# Patient Record
Sex: Male | Born: 1945 | Race: White | Hispanic: No | Marital: Married | Smoking: Never smoker
Health system: Southern US, Community
[De-identification: ages and names within clinical notes are randomized; demographics above are authoritative.]

## PROBLEM LIST (undated history)

## (undated) DIAGNOSIS — F4310 Post-traumatic stress disorder, unspecified: Secondary | ICD-10-CM

## (undated) DIAGNOSIS — I10 Essential (primary) hypertension: Secondary | ICD-10-CM

## (undated) DIAGNOSIS — K219 Gastro-esophageal reflux disease without esophagitis: Secondary | ICD-10-CM

## (undated) DIAGNOSIS — M109 Gout, unspecified: Secondary | ICD-10-CM

## (undated) DIAGNOSIS — F419 Anxiety disorder, unspecified: Secondary | ICD-10-CM

## (undated) DIAGNOSIS — E7800 Pure hypercholesterolemia, unspecified: Secondary | ICD-10-CM

## (undated) DIAGNOSIS — I499 Cardiac arrhythmia, unspecified: Secondary | ICD-10-CM

## (undated) DIAGNOSIS — I4891 Unspecified atrial fibrillation: Secondary | ICD-10-CM

## (undated) DIAGNOSIS — F32A Depression, unspecified: Secondary | ICD-10-CM

## (undated) DIAGNOSIS — N19 Unspecified kidney failure: Secondary | ICD-10-CM

## (undated) DIAGNOSIS — M1990 Unspecified osteoarthritis, unspecified site: Secondary | ICD-10-CM

## (undated) HISTORY — PX: TOTAL SHOULDER REPLACEMENT: SUR1217

## (undated) HISTORY — DX: Essential (primary) hypertension: I10

## (undated) HISTORY — DX: Pure hypercholesterolemia, unspecified: E78.00

## (undated) HISTORY — DX: Anxiety disorder, unspecified: F41.9

## (undated) HISTORY — PX: REPLACEMENT TOTAL KNEE: SUR1224

## (undated) HISTORY — PX: SKIN CANCER EXCISION: SHX779

## (undated) HISTORY — DX: Gastro-esophageal reflux disease without esophagitis: K21.9

## (undated) HISTORY — DX: Gout, unspecified: M10.9

## (undated) HISTORY — DX: Unspecified kidney failure: N19

## (undated) HISTORY — DX: Unspecified atrial fibrillation: I48.91

## (undated) HISTORY — DX: Depression, unspecified: F32.A

## (undated) HISTORY — DX: Unspecified osteoarthritis, unspecified site: M19.90

## (undated) HISTORY — DX: Post-traumatic stress disorder, unspecified: F43.10

---

## 2010-03-13 DIAGNOSIS — R809 Proteinuria, unspecified: Secondary | ICD-10-CM | POA: Insufficient documentation

## 2011-04-06 HISTORY — PX: KIDNEY TRANSPLANT: SHX239

## 2011-11-05 LAB — HM HEPATITIS C SCREENING LAB: HM Hepatitis Screen: NEGATIVE

## 2012-02-08 DIAGNOSIS — M109 Gout, unspecified: Secondary | ICD-10-CM | POA: Insufficient documentation

## 2020-04-21 ENCOUNTER — Ambulatory Visit: Payer: Self-pay | Admitting: Internal Medicine

## 2020-04-25 ENCOUNTER — Other Ambulatory Visit: Payer: Self-pay

## 2020-04-25 ENCOUNTER — Ambulatory Visit (INDEPENDENT_AMBULATORY_CARE_PROVIDER_SITE_OTHER): Payer: Medicare Other | Admitting: Internal Medicine

## 2020-04-25 ENCOUNTER — Encounter: Payer: Self-pay | Admitting: Internal Medicine

## 2020-04-25 VITALS — BP 154/64 | HR 64 | Temp 97.9°F | Ht 66.0 in | Wt 165.0 lb

## 2020-04-25 DIAGNOSIS — Z7689 Persons encountering health services in other specified circumstances: Secondary | ICD-10-CM

## 2020-04-25 DIAGNOSIS — Z940 Kidney transplant status: Secondary | ICD-10-CM | POA: Insufficient documentation

## 2020-04-25 DIAGNOSIS — I480 Paroxysmal atrial fibrillation: Secondary | ICD-10-CM | POA: Diagnosis not present

## 2020-04-25 DIAGNOSIS — F4310 Post-traumatic stress disorder, unspecified: Secondary | ICD-10-CM | POA: Insufficient documentation

## 2020-04-25 DIAGNOSIS — N400 Enlarged prostate without lower urinary tract symptoms: Secondary | ICD-10-CM

## 2020-04-25 DIAGNOSIS — I10 Essential (primary) hypertension: Secondary | ICD-10-CM | POA: Diagnosis not present

## 2020-04-25 DIAGNOSIS — M109 Gout, unspecified: Secondary | ICD-10-CM | POA: Insufficient documentation

## 2020-04-25 DIAGNOSIS — N138 Other obstructive and reflux uropathy: Secondary | ICD-10-CM | POA: Insufficient documentation

## 2020-04-25 DIAGNOSIS — N401 Enlarged prostate with lower urinary tract symptoms: Secondary | ICD-10-CM | POA: Insufficient documentation

## 2020-04-25 DIAGNOSIS — E785 Hyperlipidemia, unspecified: Secondary | ICD-10-CM | POA: Insufficient documentation

## 2020-04-25 MED ORDER — HYDRALAZINE HCL 25 MG PO TABS
25.0000 mg | ORAL_TABLET | Freq: Three times a day (TID) | ORAL | 1 refills | Status: DC
Start: 2020-04-25 — End: 2020-05-23

## 2020-04-25 MED ORDER — PREDNISONE 10 MG (21) PO TBPK: ORAL_TABLET | ORAL | 0 refills | Status: DC

## 2020-04-25 MED ORDER — FUROSEMIDE 40 MG PO TABS: 40.0000 mg | ORAL_TABLET | Freq: Every day | ORAL | 1 refills | Status: DC

## 2020-04-25 MED ORDER — ESCITALOPRAM OXALATE 10 MG PO TABS: 10.0000 mg | ORAL_TABLET | Freq: Every day | ORAL | 3 refills | Status: AC

## 2020-04-25 MED ORDER — EZETIMIBE 10 MG PO TABS: 10.0000 mg | ORAL_TABLET | Freq: Every day | ORAL | 1 refills | Status: AC

## 2020-04-25 NOTE — Assessment & Plan Note (Signed)
Was in army, has dreams from it Was on Xanax 0.25 QD PRN Counseled about medications, agrees to try SSRI. Started Lexapro.

## 2020-04-25 NOTE — Assessment & Plan Note (Signed)
Care established Previous chart reviewed History and medications reviewed with the patient 

## 2020-04-25 NOTE — Progress Notes (Signed)
New Patient Office Visit  Subjective:  Patient ID: Cristian Henderson, male    DOB: [birth year]  Age: 75 y.o. MRN: [id number]  CC:  Chief Complaint  Patient presents with  . New Patient (Initial Visit)    Here to establish care. Complains of big toe on R foot, has a hx of gout.    HPI Kamsiyochukwu Spickler is a 75 year old male with PMH of kidney transplant, paroxysmal A Fib, HTN, HLD, gout, PTSD and BPH who presents for establishing care. He recently moved from [place]. His wife is present with him who also has a visit today.  He states that he has been feeling well overall. He takes his medications regularly, but could not get his morning medications as he had to come for the appointment.  His BP was 154/64 in the office today. He denies any dizziness, chest pain, dyspnea or palpitations.  He has a h/o kidney transplant and has been on immunosuppresants. He has an appointment with local Nephrologist. He also takes Flomax for BPH.  He has a history of PTSD, and was prescribed Xanax. He is willing to try SSRI.  He has had colonoscopy about 2-3 years ago.  He is up-to-date with COVID and flu vaccine. He has had Pneumonia vaccine, but unsure of the details.  Past Medical History:  Diagnosis Date  . Atrial fibrillation ([place])   . Gout   . Hypertension   . PTSD (post-traumatic stress disorder)     Past Surgical History:  Procedure Laterality Date  . KIDNEY TRANSPLANT  2013  . REPLACEMENT TOTAL KNEE    . SKIN CANCER EXCISION    . TOTAL SHOULDER REPLACEMENT      History reviewed. No pertinent family history.  Social History   Socioeconomic History  . Marital status: Married    Spouse name: Not on file  . Number of children: Not on file  . Years of education: Not on file  . Highest education level: Not on file  Occupational History  . Not on file  Tobacco Use  . Smoking status: Never Smoker  . Smokeless tobacco: Never Used  Substance and Sexual Activity  . Alcohol use:  Not on file  . Drug use: Not on file  . Sexual activity: Not on file  Other Topics Concern  . Not on file  Social History Narrative  . Not on file   Social Determinants of Health   Financial Resource Strain: Not on file  Food Insecurity: Not on file  Transportation Needs: Not on file  Physical Activity: Not on file  Stress: Not on file  Social Connections: Not on file  Intimate Partner Violence: Not on file    ROS Review of Systems  Constitutional: Negative for chills and fever.  HENT: Negative for congestion and sore throat.   Eyes: Negative for pain and discharge.  Respiratory: Negative for cough and shortness of breath.   Cardiovascular: Positive for palpitations (Intermittent). Negative for chest pain.  Gastrointestinal: Negative for constipation, diarrhea, nausea and vomiting.  Endocrine: Negative for polydipsia and polyuria.  Genitourinary: Negative for dysuria and hematuria.  Musculoskeletal: Negative for neck pain and neck stiffness.       Right toe pain  Skin: Negative for rash.  Neurological: Negative for dizziness, weakness, numbness and headaches.  Psychiatric/Behavioral: Negative for agitation and behavioral problems.    Objective:   Today's Vitals: BP (!) 154/64 (BP Location: Right Arm, Patient Position: Sitting, Cuff Size: Normal)   Pulse 64   Temp 97.9  F (36.6 C) (Temporal)   Ht 5\' 6"  (1.676 m)   Wt 165 lb (74.8 kg)   SpO2 96%   BMI 26.63 kg/m   Physical Exam Vitals reviewed.  Constitutional:      General: He is not in acute distress.    Appearance: He is not diaphoretic.  HENT:     Head: Normocephalic and atraumatic.     Nose: Nose normal.     Mouth/Throat:     Mouth: Mucous membranes are moist.  Eyes:     General: No scleral icterus.    Extraocular Movements: Extraocular movements intact.     Pupils: Pupils are equal, round, and reactive to light.  Cardiovascular:     Rate and Rhythm: Normal rate and regular rhythm.     Pulses: Normal  pulses.     Heart sounds: Normal heart sounds. No murmur heard.   Pulmonary:     Breath sounds: Normal breath sounds. No wheezing or rales.  Abdominal:     Palpations: Abdomen is soft.     Tenderness: There is no abdominal tenderness.  Musculoskeletal:        General: Tenderness (Right toe, mild swelling and warmth) present.     Cervical back: Neck supple. No tenderness.     Right lower leg: No edema.     Left lower leg: No edema.  Skin:    General: Skin is warm.     Findings: No rash.  Neurological:     General: No focal deficit present.     Mental Status: He is alert and oriented to person, place, and time.     Sensory: No sensory deficit.     Motor: No weakness.  Psychiatric:        Mood and Affect: Mood normal.        Behavior: Behavior normal.     Assessment & Plan:   Problem List Items Addressed This Visit      Encounter to establish care - Primary   Care established Previous chart reviewed History and medications reviewed with the patient       Cardiovascular and Mediastinum   Paroxysmal atrial fibrillation (HCC)    Rate-controlled with Metorprolol and Diltiazem On Xarelto for [facility]      Relevant Medications   omega-3 acid ethyl esters (LOVAZA) 1 g capsule   Rivaroxaban (XARELTO) 15 MG TABS tablet   diltiazem (DILACOR XR) 240 MG 24 hr capsule   metoprolol tartrate (LOPRESSOR) 25 MG tablet   furosemide (LASIX) 40 MG tablet   ezetimibe (ZETIA) 10 MG tablet   hydrALAZINE (APRESOLINE) 25 MG tablet   Other Relevant Orders   Ambulatory referral to Cardiology   HTN (hypertension)    BP Readings from Last 1 Encounters:  04/25/20 (!) 154/64   Elevated, patient had not morning dose of medications - Metoprolol, Diltiazem, Lasix and Hydralazine Counseled for compliance with the medications Advised DASH diet and moderate exercise/walking, at least 150 mins/week       Relevant Medications   omega-3 acid ethyl esters (LOVAZA) 1 g capsule   Rivaroxaban  (XARELTO) 15 MG TABS tablet   diltiazem (DILACOR XR) 240 MG 24 hr capsule   metoprolol tartrate (LOPRESSOR) 25 MG tablet   furosemide (LASIX) 40 MG tablet   ezetimibe (ZETIA) 10 MG tablet   hydrALAZINE (APRESOLINE) 25 MG tablet     Musculoskeletal and Integument   Acute gout involving toe of right foot    Has history gout, no acute flares for years Prescribed  steroids due to h/o kidney transplant, avoid NSAIDs      Relevant Medications   predniSONE (STERAPRED UNI-PAK 21 TAB) 10 MG (21) TBPK tablet     Genitourinary   Benign prostatic hyperplasia    On Flomax      Relevant Medications   tamsulosin (FLOMAX) 0.4 MG CAPS capsule     Other         S/P kidney transplant    On Cyclosporine, Mycophenolate On Sodium bicarb Has an appointment with Nephrology      Relevant Medications   mycophenolate (MYFORTIC) 180 MG EC tablet   cycloSPORINE modified (NEORAL) 25 MG capsule   predniSONE (STERAPRED UNI-PAK 21 TAB) 10 MG (21) TBPK tablet   HLD (hyperlipidemia)    Intolerant to statin due to intense myalgias On Zetia, Lovaza and Niacinamide      Relevant Medications   omega-3 acid ethyl esters (LOVAZA) 1 g capsule   Rivaroxaban (XARELTO) 15 MG TABS tablet   diltiazem (DILACOR XR) 240 MG 24 hr capsule   metoprolol tartrate (LOPRESSOR) 25 MG tablet   furosemide (LASIX) 40 MG tablet   ezetimibe (ZETIA) 10 MG tablet   hydrALAZINE (APRESOLINE) 25 MG tablet   PTSD (post-traumatic stress disorder)    Was in army, has dreams from it Was on Xanax 0.25 QD PRN Counseled about medications, agrees to try SSRI. Started Lexapro.      Relevant Medications   escitalopram (LEXAPRO) 10 MG tablet      Outpatient Encounter Medications as of 04/25/2020  Medication Sig  . cycloSPORINE modified (NEORAL) 25 MG capsule Take by mouth. Take 3 tablets PO BID  . diltiazem (DILACOR XR) 240 MG 24 hr capsule Take 240 mg by mouth daily.  Marland Kitchen escitalopram (LEXAPRO) 10 MG tablet Take 1 tablet (10 mg  total) by mouth daily.  . metoprolol tartrate (LOPRESSOR) 25 MG tablet Take 25 mg by mouth 2 (two) times daily.  . mycophenolate (MYFORTIC) 180 MG EC tablet Take 180 mg by mouth 2 (two) times daily.  . niacinamide 500 MG tablet Take 500 mg by mouth 2 (two) times daily with a meal.  . omega-3 acid ethyl esters (LOVAZA) 1 g capsule Take by mouth. Take 2 tabs PO BID.  Marland Kitchen omeprazole (PRILOSEC) 40 MG capsule Take 40 mg by mouth daily.  . predniSONE (STERAPRED UNI-PAK 21 TAB) 10 MG (21) TBPK tablet Take as package instructions  . Rivaroxaban (XARELTO) 15 MG TABS tablet Take 15 mg by mouth daily with supper.  . sodium bicarbonate 650 MG tablet Take 650 mg by mouth 2 (two) times daily.  . tamsulosin (FLOMAX) 0.4 MG CAPS capsule Take 0.4 mg by mouth daily.  Marland Kitchen UNABLE TO FIND Vitamin D 10 mcg. Take 1 tablet PO BID.  . [DISCONTINUED] ezetimibe (ZETIA) 10 MG tablet Take 10 mg by mouth daily.  . [DISCONTINUED] furosemide (LASIX) 40 MG tablet Take 40 mg by mouth daily.  . [DISCONTINUED] hydrALAZINE (APRESOLINE) 25 MG tablet Take 25 mg by mouth 3 (three) times daily.  Marland Kitchen ezetimibe (ZETIA) 10 MG tablet Take 1 tablet (10 mg total) by mouth daily.  . furosemide (LASIX) 40 MG tablet Take 1 tablet (40 mg total) by mouth daily.  . hydrALAZINE (APRESOLINE) 25 MG tablet Take 1 tablet (25 mg total) by mouth 3 (three) times daily.   No facility-administered encounter medications on file as of 04/25/2020.    Follow-up: Return in about 4 months (around 08/23/2020).   [name], MD

## 2020-04-25 NOTE — Assessment & Plan Note (Signed)
Has history gout, no acute flares for years Prescribed steroids due to h/o kidney transplant, avoid NSAIDs

## 2020-04-25 NOTE — Assessment & Plan Note (Signed)
BP Readings from Last 1 Encounters:  04/25/20 (!) 154/64   Elevated, patient had not morning dose of medications - Metoprolol, Diltiazem, Lasix and Hydralazine Counseled for compliance with the medications Advised DASH diet and moderate exercise/walking, at least 150 mins/week

## 2020-04-25 NOTE — Assessment & Plan Note (Signed)
Intolerant to statin due to intense myalgias On Zetia, Lovaza and Niacinamide

## 2020-04-25 NOTE — Assessment & Plan Note (Signed)
On Cyclosporine, Mycophenolate On Sodium bicarb Has an appointment with Nephrology

## 2020-04-25 NOTE — Assessment & Plan Note (Signed)
Rate-controlled with Metorprolol and Diltiazem On Xarelto for [facility]

## 2020-04-25 NOTE — Assessment & Plan Note (Signed)
On Flomax

## 2020-04-25 NOTE — Patient Instructions (Addendum)
Please continue taking medications as prescribed.  Please start taking Lexapro as prescribed.  Please follow low sodium diet and perform moderate exercise/walking at least 150 mins/week.  Please contact us if your BP is consistently more than 150/90 at home.  You are being referred to Cardiology for Atrial fibrillation.

## 2020-05-02 ENCOUNTER — Other Ambulatory Visit: Payer: Self-pay

## 2020-05-02 ENCOUNTER — Ambulatory Visit (INDEPENDENT_AMBULATORY_CARE_PROVIDER_SITE_OTHER): Payer: Medicare Other

## 2020-05-02 VITALS — Ht 65.0 in | Wt 165.0 lb

## 2020-05-02 DIAGNOSIS — Z0000 Encounter for general adult medical examination without abnormal findings: Secondary | ICD-10-CM | POA: Diagnosis not present

## 2020-05-02 NOTE — Patient Instructions (Addendum)
Mr. Cristian Henderson , Thank you for taking time to come for your Medicare Wellness Visit. I appreciate your ongoing commitment to your health goals. Please review the following plan we discussed and let me know if I can assist you in the future.   Screening recommendations/referrals: Colonoscopy: 2030 Recommended yearly ophthalmology/optometry visit for glaucoma screening and checkup Recommended yearly dental visit for hygiene and checkup  Vaccinations: Influenza vaccine: Fall 2022 Pneumococcal vaccine: Complete Tdap vaccine: Not on file Shingles vaccine: Declined  Advanced directives: No  Conditions/risks identified: None  Next appointment: 08/29/20 @ 8:20 am  Preventive Care 65 Years and Older, Male Preventive care refers to lifestyle choices and visits with your health care provider that can promote health and wellness. What does preventive care include?  A yearly physical exam. This is also called an annual well check.  Dental exams once or twice a year.  Routine eye exams. Ask your health care provider how often you should have your eyes checked.  Personal lifestyle choices, including:  Daily care of your teeth and gums.  Regular physical activity.  Eating a healthy diet.  Avoiding tobacco and drug use.  Limiting alcohol use.  Practicing safe sex.  Taking low doses of aspirin every day.  Taking vitamin and mineral supplements as recommended by your health care provider. What happens during an annual well check? The services and screenings done by your health care provider during your annual well check will depend on your age, overall health, lifestyle risk factors, and family history of disease. Counseling  Your health care provider may ask you questions about your:  Alcohol use.  Tobacco use.  Drug use.  Emotional well-being.  Home and relationship well-being.  Sexual activity.  Eating habits.  History of falls.  Memory and ability to understand  (cognition).  Work and work Statistician. Screening  You may have the following tests or measurements:  Height, weight, and BMI.  Blood pressure.  Lipid and cholesterol levels. These may be checked every 5 years, or more frequently if you are over 38 years old.  Skin check.  Lung cancer screening. You may have this screening every year starting at age 52 if you have a 30-pack-year history of smoking and currently smoke or have quit within the past 15 years.  Fecal occult blood test (FOBT) of the stool. You may have this test every year starting at age 38.  Flexible sigmoidoscopy or colonoscopy. You may have a sigmoidoscopy every 5 years or a colonoscopy every 10 years starting at age 39.  Prostate cancer screening. Recommendations will vary depending on your family history and other risks.  Hepatitis C blood test.  Hepatitis B blood test.  Sexually transmitted disease (STD) testing.  Diabetes screening. This is done by checking your blood sugar (glucose) after you have not eaten for a while (fasting). You may have this done every 1-3 years.  Abdominal aortic aneurysm (AAA) screening. You may need this if you are a current or former smoker.  Osteoporosis. You may be screened starting at age 9 if you are at high risk. Talk with your health care provider about your test results, treatment options, and if necessary, the need for more tests. Vaccines  Your health care provider may recommend certain vaccines, such as:  Influenza vaccine. This is recommended every year.  Tetanus, diphtheria, and acellular pertussis (Tdap, Td) vaccine. You may need a Td booster every 10 years.  Zoster vaccine. You may need this after age 19.  Pneumococcal 13-valent conjugate (  PCV13) vaccine. One dose is recommended after age 84.  Pneumococcal polysaccharide (PPSV23) vaccine. One dose is recommended after age 25. Talk to your health care provider about which screenings and vaccines you need and  how often you need them. This information is not intended to replace advice given to you by your health care provider. Make sure you discuss any questions you have with your health care provider. Document Released: 04/18/2015 Document Revised: 12/10/2015 Document Reviewed: 01/21/2015 Elsevier Interactive Patient Education  2017 [place] Prevention in the Home Falls can cause injuries. They can happen to people of all ages. There are many things you can do to make your home safe and to help prevent falls. What can I do on the outside of my home?  Regularly fix the edges of walkways and driveways and fix any cracks.  Remove anything that might make you trip as you walk through a door, such as a raised step or threshold.  Trim any bushes or trees on the path to your home.  Use bright outdoor lighting.  Clear any walking paths of anything that might make someone trip, such as rocks or tools.  Regularly check to see if handrails are loose or broken. Make sure that both sides of any steps have handrails.  Any raised decks and porches should have guardrails on the edges.  Have any leaves, snow, or ice cleared regularly.  Use sand or salt on walking paths during winter.  Clean up any spills in your garage right away. This includes oil or grease spills. What can I do in the bathroom?  Use night lights.  Install grab bars by the toilet and in the tub and shower. Do not use towel bars as grab bars.  Use non-skid mats or decals in the tub or shower.  If you need to sit down in the shower, use a plastic, non-slip stool.  Keep the floor dry. Clean up any water that spills on the floor as soon as it happens.  Remove soap buildup in the tub or shower regularly.  Attach bath mats securely with double-sided non-slip rug tape.  Do not have throw rugs and other things on the floor that can make you trip. What can I do in the bedroom?  Use night lights.  Make sure that you  have a light by your bed that is easy to reach.  Do not use any sheets or blankets that are too big for your bed. They should not hang down onto the floor.  Have a firm chair that has side arms. You can use this for support while you get dressed.  Do not have throw rugs and other things on the floor that can make you trip. What can I do in the kitchen?  Clean up any spills right away.  Avoid walking on wet floors.  Keep items that you use a lot in easy-to-reach places.  If you need to reach something above you, use a strong step stool that has a grab bar.  Keep electrical cords out of the way.  Do not use floor polish or wax that makes floors slippery. If you must use wax, use non-skid floor wax.  Do not have throw rugs and other things on the floor that can make you trip. What can I do with my stairs?  Do not leave any items on the stairs.  Make sure that there are handrails on both sides of the stairs and use them. Fix handrails that  are broken or loose. Make sure that handrails are as long as the stairways.  Check any carpeting to make sure that it is firmly attached to the stairs. Fix any carpet that is loose or worn.  Avoid having throw rugs at the top or bottom of the stairs. If you do have throw rugs, attach them to the floor with carpet tape.  Make sure that you have a light switch at the top of the stairs and the bottom of the stairs. If you do not have them, ask someone to add them for you. What else can I do to help prevent falls?  Wear shoes that:  Do not have high heels.  Have rubber bottoms.  Are comfortable and fit you well.  Are closed at the toe. Do not wear sandals.  If you use a stepladder:  Make sure that it is fully opened. Do not climb a closed stepladder.  Make sure that both sides of the stepladder are locked into place.  Ask someone to hold it for you, if possible.  Clearly mark and make sure that you can see:  Any grab bars or  handrails.  First and last steps.  Where the edge of each step is.  Use tools that help you move around (mobility aids) if they are needed. These include:  Canes.  Walkers.  Scooters.  Crutches.  Turn on the lights when you go into a dark area. Replace any light bulbs as soon as they burn out.  Set up your furniture so you have a clear path. Avoid moving your furniture around.  If any of your floors are uneven, fix them.  If there are any pets around you, be aware of where they are.  Review your medicines with your doctor. Some medicines can make you feel dizzy. This can increase your chance of falling. Ask your doctor what other things that you can do to help prevent falls. This information is not intended to replace advice given to you by your health care provider. Make sure you discuss any questions you have with your health care provider. Document Released: 01/16/2009 Document Revised: 08/28/2015 Document Reviewed: 04/26/2014 Elsevier Interactive Patient Education  2017 [employer].

## 2020-05-02 NOTE — Progress Notes (Signed)
Subjective:   Cristian Henderson is a 75 y.o. male who presents for Medicare Annual/Subsequent preventive examination.  Review of Systems       Objective:    There were no vitals filed for this visit. There is no height or weight on file to calculate BMI.  No flowsheet data found.  Current Medications (verified) Outpatient Encounter Medications as of 05/02/2020  Medication Sig  . cycloSPORINE modified (NEORAL) 25 MG capsule Take by mouth. Take 3 tablets PO BID  . diltiazem (DILACOR XR) 240 MG 24 hr capsule Take 240 mg by mouth daily.  Marland Kitchen escitalopram (LEXAPRO) 10 MG tablet Take 1 tablet (10 mg total) by mouth daily.  Marland Kitchen ezetimibe (ZETIA) 10 MG tablet Take 1 tablet (10 mg total) by mouth daily.  . furosemide (LASIX) 40 MG tablet Take 1 tablet (40 mg total) by mouth daily.  . hydrALAZINE (APRESOLINE) 25 MG tablet Take 1 tablet (25 mg total) by mouth 3 (three) times daily.  . metoprolol tartrate (LOPRESSOR) 25 MG tablet Take 25 mg by mouth 2 (two) times daily.  . mycophenolate (MYFORTIC) 180 MG EC tablet Take 180 mg by mouth 2 (two) times daily.  . niacinamide 500 MG tablet Take 500 mg by mouth 2 (two) times daily with a meal.  . omega-3 acid ethyl esters (LOVAZA) 1 g capsule Take by mouth. Take 2 tabs PO BID.  Marland Kitchen omeprazole (PRILOSEC) 40 MG capsule Take 40 mg by mouth daily.  . predniSONE (STERAPRED UNI-PAK 21 TAB) 10 MG (21) TBPK tablet Take as package instructions  . Rivaroxaban (XARELTO) 15 MG TABS tablet Take 15 mg by mouth daily with supper.  . sodium bicarbonate 650 MG tablet Take 650 mg by mouth 2 (two) times daily.  . tamsulosin (FLOMAX) 0.4 MG CAPS capsule Take 0.4 mg by mouth daily.  Marland Kitchen UNABLE TO FIND Vitamin D 10 mcg. Take 1 tablet PO BID.   No facility-administered encounter medications on file as of 05/02/2020.    Allergies (verified) Doxycycline hyclate and Simvastatin   History: Past Medical History:  Diagnosis Date  . Atrial fibrillation ([place])   . Gout   .  Hypertension   . PTSD (post-traumatic stress disorder)    Past Surgical History:  Procedure Laterality Date  . KIDNEY TRANSPLANT  2013  . REPLACEMENT TOTAL KNEE    . SKIN CANCER EXCISION    . TOTAL SHOULDER REPLACEMENT     No family history on file. Social History   Socioeconomic History  . Marital status: Married    Spouse name: Not on file  . Number of children: Not on file  . Years of education: Not on file  . Highest education level: Not on file  Occupational History  . Not on file  Tobacco Use  . Smoking status: Never Smoker  . Smokeless tobacco: Never Used  Substance and Sexual Activity  . Alcohol use: Not on file  . Drug use: Not on file  . Sexual activity: Not on file  Other Topics Concern  . Not on file  Social History Narrative  . Not on file   Social Determinants of Health   Financial Resource Strain: Not on file  Food Insecurity: Not on file  Transportation Needs: Not on file  Physical Activity: Not on file  Stress: Not on file  Social Connections: Not on file    Tobacco Counseling Counseling given: Not Answered   Clinical Intake:  Diabetic? no         Activities of Daily Living No flowsheet data found.  Patient Care Team: [name], MD as PCP - General (Internal Medicine)  Indicate any recent Medical Services you may have received from other than [facility] providers in the past year (date may be approximate).     Assessment:   This is a routine wellness examination for Cristian Henderson.  Hearing/Vision screen No exam data present  Dietary issues and exercise activities discussed:    Goals   None    Depression Screen PHQ 2/9 Scores 04/25/2020  PHQ - 2 Score 0    Fall Risk Fall Risk  04/25/2020  Falls in the past year? 0  Number falls in past yr: 0  Injury with Fall? 0  Risk for fall due to : No Fall Risks  Follow up Falls evaluation completed    [place]:  Any  stairs in or around the home? No  If so, are there any without handrails? No  Home free of loose throw rugs in walkways, pet beds, electrical cords, etc? Yes  Adequate lighting in your home to reduce risk of falls? Yes   ASSISTIVE DEVICES UTILIZED TO PREVENT FALLS:  Life alert? No  Use of a cane, walker or w/c? No  Grab bars in the bathroom? No  Shower chair or bench in shower? No  Elevated toilet seat or a handicapped toilet? No   TIMED UP AND GO:  Was the test performed? No .  Length of time to ambulate n/a     Cognitive Function:        Immunizations Immunization History  Administered Date(s) Administered  . Fluad Quad(high Dose 65+) 01/04/2020  . Moderna Sars-Covid-2 Vaccination 06/02/2019, 06/30/2019, 12/20/2019    TDAP status: Due, Education has been provided regarding the importance of this vaccine. Advised may receive this vaccine at local pharmacy or Health Dept. Aware to provide a copy of the vaccination record if obtained from local pharmacy or Health Dept. Verbalized acceptance and understanding.  Flu Vaccine status: Up to date  Pneumococcal vaccine status: Up to date  Covid-19 vaccine status: Completed vaccines  Qualifies for Shingles Vaccine? Yes   Zostavax completed No   Shingrix Completed?: No.    Education has been provided regarding the importance of this vaccine. Patient has been advised to call insurance company to determine out of pocket expense if they have not yet received this vaccine. Advised may also receive vaccine at local pharmacy or Health Dept. Verbalized acceptance and understanding.  Screening Tests Health Maintenance  Topic Date Due  . Hepatitis C Screening  Never done  . TETANUS/TDAP  Never done  . COLONOSCOPY (Pts 45-13yrs Insurance coverage will need to be confirmed)  Never done  . PNA vac Low Risk Adult (1 of 2 - PCV13) Never done  . COVID-19 Vaccine (4 - Booster for Moderna series) 06/18/2020  . INFLUENZA VACCINE  Completed     Health Maintenance  Health Maintenance Due  Topic Date Due  . Hepatitis C Screening  Never done  . TETANUS/TDAP  Never done  . COLONOSCOPY (Pts 45-66yrs Insurance coverage will need to be confirmed)  Never done  . PNA vac Low Risk Adult (1 of 2 - PCV13) Never done    Colorectal cancer screening: Type of screening: Colonoscopy. Completed 2020. Repeat every 10 years  Lung Cancer Screening: (Low Dose CT Chest recommended if Age 76-80 years, 30 pack-year currently smoking OR  have quit w/in 15years.) does not qualify.   Lung Cancer Screening Referral: n/a  Additional Screening:  Hepatitis C Screening: does not qualify; Completed  Vision Screening: Recommended annual ophthalmology exams for early detection of glaucoma and other disorders of the eye. Is the patient up to date with their annual eye exam?  No  Who is the provider or what is the name of the office in which the patient attends annual eye exams? n/a If pt is not established with a provider, would they like to be referred to a provider to establish care? No .   Dental Screening: Recommended annual dental exams for proper oral hygiene  Community Resource Referral / Chronic Care Management: CRR required this visit?  No   CCM required this visit?  No      Plan:     I have personally reviewed and noted the following in the patient's chart:   . Medical and social history . Use of alcohol, tobacco or illicit drugs  . Current medications and supplements . Functional ability and status . Nutritional status . Physical activity . Advanced directives . List of other physicians . Hospitalizations, surgeries, and ER visits in previous 12 months . Vitals . Screenings to include cognitive, depression, and falls . Referrals and appointments  In addition, I have reviewed and discussed with patient certain preventive protocols, quality metrics, and best practice recommendations. A written personalized care plan for  preventive services as well as general preventive health recommendations were provided to patient.     Laretta Bolster, [place]   8/46/6599   Nurse Notes: AWV conducted by nurse in office by phone. Patient gave consent to telehealth visit via audio. Patient at home at the time of this visit. Provider in the office at the time of this visit. Visit took 25 minutes to complete.

## 2020-05-08 ENCOUNTER — Encounter: Payer: Self-pay | Admitting: Internal Medicine

## 2020-05-08 ENCOUNTER — Telehealth: Payer: Self-pay

## 2020-05-08 ENCOUNTER — Telehealth (INDEPENDENT_AMBULATORY_CARE_PROVIDER_SITE_OTHER): Payer: Medicare Other | Admitting: Internal Medicine

## 2020-05-08 ENCOUNTER — Other Ambulatory Visit: Payer: Self-pay

## 2020-05-08 DIAGNOSIS — M109 Gout, unspecified: Secondary | ICD-10-CM

## 2020-05-08 MED ORDER — COLCHICINE 0.6 MG PO TABS: 0.6000 mg | ORAL_TABLET | Freq: Every day | ORAL | 0 refills | Status: DC

## 2020-05-08 NOTE — Progress Notes (Signed)
Virtual Visit via Telephone Note   This visit type was conducted due to national recommendations for restrictions regarding the COVID-19 Pandemic (e.g. social distancing) in an effort to limit this patient's exposure and mitigate transmission in our community.  Due to his co-morbid illnesses, this patient is at least at moderate risk for complications without adequate follow up.  This format is felt to be most appropriate for this patient at this time.  The patient did not have access to video technology/had technical difficulties with video requiring transitioning to audio format only (telephone).  All issues noted in this document were discussed and addressed.  No physical exam could be performed with this format.   Evaluation Performed:  Follow-up visit  Date:  05/08/2020   ID:  Cristian Henderson, DOB 02/13/1946, MRN [id number]  Patient Location: Home Provider Location: Office/Clinic  Participants: Patient Location of Patient: Home Location of Provider: Telehealth Consent was obtain for visit to be over via telehealth. I verified that I am speaking with the correct person using two identifiers.  PCP:  [name], MD   Chief Complaint:  Gout flare  History of Present Illness:    Cristian Henderson is a 75 y.o. male with PMH of kidney transplant, paroxysmal A Fib, HTN, HLD, gout, PTSD and BPH who has a televisit for c/o persistent gout flare despite steroid treatment.  He has b/l foot pain, near the toe despite taking steroids for 6 days. He denies any numbness, tingling or weakness. Denies fever, chills, nausea or vomiting. Denies any other joint pain currently except b/l foot pain. Prednisone was prescribed considering his h/o kidney transplant.  The patient does not have symptoms concerning for COVID-19 infection (fever, chills, cough, or new shortness of breath).   Past Medical, Surgical, Social History, Allergies, and Medications have been Reviewed.  Past Medical History:   Diagnosis Date  . Atrial fibrillation ([place])   . Gout   . Hypertension   . PTSD (post-traumatic stress disorder)    Past Surgical History:  Procedure Laterality Date  . KIDNEY TRANSPLANT  2013  . REPLACEMENT TOTAL KNEE    . SKIN CANCER EXCISION    . TOTAL SHOULDER REPLACEMENT       Current Meds  Medication Sig  . colchicine 0.6 MG tablet Take 1 tablet (0.6 mg total) by mouth daily. Please take 1 tablet on first day, followed by 1 tablet 1 hour later. Then 1 tablet once everyday until gout flare resolves.  . cycloSPORINE modified (NEORAL) 25 MG capsule Take by mouth. Take 3 tablets PO BID  . diltiazem (DILACOR XR) 240 MG 24 hr capsule Take 240 mg by mouth daily.  Marland Kitchen escitalopram (LEXAPRO) 10 MG tablet Take 1 tablet (10 mg total) by mouth daily.  Marland Kitchen ezetimibe (ZETIA) 10 MG tablet Take 1 tablet (10 mg total) by mouth daily.  . furosemide (LASIX) 40 MG tablet Take 1 tablet (40 mg total) by mouth daily.  . hydrALAZINE (APRESOLINE) 25 MG tablet Take 1 tablet (25 mg total) by mouth 3 (three) times daily.  . metoprolol tartrate (LOPRESSOR) 25 MG tablet Take 25 mg by mouth 2 (two) times daily.  . mycophenolate (MYFORTIC) 180 MG EC tablet Take 180 mg by mouth 2 (two) times daily.  . niacinamide 500 MG tablet Take 500 mg by mouth 2 (two) times daily with a meal.  . omega-3 acid ethyl esters (LOVAZA) 1 g capsule Take by mouth. Take 2 tabs PO BID.  Marland Kitchen omeprazole (PRILOSEC) 40  MG capsule Take 40 mg by mouth daily.  . predniSONE (STERAPRED UNI-PAK 21 TAB) 10 MG (21) TBPK tablet Take as package instructions  . Rivaroxaban (XARELTO) 15 MG TABS tablet Take 15 mg by mouth daily with supper.  . sodium bicarbonate 650 MG tablet Take 650 mg by mouth 2 (two) times daily.  . tamsulosin (FLOMAX) 0.4 MG CAPS capsule Take 0.4 mg by mouth daily.  Marland Kitchen UNABLE TO FIND Vitamin D 10 mcg. Take 1 tablet PO BID.     Allergies:   Doxycycline hyclate and Simvastatin   ROS:   Please see the history of present illness.      All other systems reviewed and are negative.   Labs/Other Tests and Data Reviewed:    Recent Labs: No results found for requested labs within last 8760 hours.   Recent Lipid Panel No results found for: CHOL, TRIG, HDL, CHOLHDL, LDLCALC, LDLDIRECT  Wt Readings from Last 3 Encounters:  05/02/20 165 lb (74.8 kg)  04/25/20 165 lb (74.8 kg)      ASSESSMENT & PLAN:    Acute gout involving toe of right foot Persistent symptoms despite steroid treatment Prescribed Colchicine Cautious use while being on Cyclosporine Advised to report with any unusual symptoms Will refer to Orthopedic surgeon if persistent symptoms    Time:   Today, I have spent 15 minutes reviewing the chart, including problem list, medications, and with the patient with telehealth technology discussing the above problems.   Medication Adjustments/Labs and Tests Ordered: Current medicines are reviewed at length with the patient today.  Concerns regarding medicines are outlined above.   Tests Ordered: No orders of the defined types were placed in this encounter.   Medication Changes: Meds ordered this encounter  Medications  . colchicine 0.6 MG tablet    Sig: Take 1 tablet (0.6 mg total) by mouth daily. Please take 1 tablet on first day, followed by 1 tablet 1 hour later. Then 1 tablet once everyday until gout flare resolves.    Dispense:  15 tablet    Refill:  0     Note: This dictation was prepared with Dragon dictation along with smaller phrase technology. Similar sounding words can be transcribed inadequately or may not be corrected upon review. Any transcriptional errors that result from this process are unintentional.      Disposition:  Follow up  Signed, [name], MD  05/08/2020 11:55 AM     [place]

## 2020-05-08 NOTE — Assessment & Plan Note (Signed)
Persistent symptoms despite steroid treatment Prescribed Colchicine Cautious use while being on Cyclosporine Advised to report with any unusual symptoms Will refer to Orthopedic surgeon if persistent symptoms

## 2020-05-08 NOTE — Telephone Encounter (Signed)
Patient called states the medication he was given for gout has not worked and is requesting something else p# [phone]

## 2020-05-08 NOTE — Patient Instructions (Signed)
Please take Colchicine as prescribed. 1 tablet on day 1, followed by 1 tablet 1 hour later, and then 1 tablet once daily.  Please contact if symptoms are persistent.

## 2020-05-08 NOTE — Telephone Encounter (Signed)
Pt can have phone visit

## 2020-05-13 NOTE — Progress Notes (Signed)
CARDIOLOGY CONSULT NOTE       Patient ID: Cristian Henderson MRN: [id number] DOB/AGE: October 19, 1945 75 y.o.  Admit date: (Not on file) Referring Physician: [name] Primary Physician: [name], MD Primary Cardiologist: New Reason for Consultation: PAF  Active Problems:   * No active hospital problems. *   HPI:  75 y.o. referred by Dr [name] for PAF. History of HTN, Gout and PTSD He is post renal transplant 2013 Recent gout exacerbation in feet despite steroids. Started on colchicine 05/08/20 Recently moved from [place] PAF with bradycardia in setting hyperkalemia and worsening Cr  He is immunosuppressed with mycophenolate and cyclosporine Steroids recently for gout  Intolerant to statins with myalgias on zetia  Started on Lexapro for PTSD previously in [employer]   Reviewed over 70 pages notes from [place] [address]  Dr [name]  Phone [phone] Fax [phone]  Ex myovue: 09/24/18 normal EF 67% Echo 11/06/18 Normal EF 55-60% mild LVH mild AR/MR LA 39 mm   Baseline Cr 2.6 Hct 34.9 PLT 166   Admitted June 2020  with CHF and new onset afib converted with cardizem CHADVASC 2  Felt OSA contributing to dyspnea and PAF  Admitted 01/12/19 with junctional rhythm and hyperkalemia with worsening renal function  Amlodipine d/c cardizem increased to 240 mg ? Zio patch given for a week   Remarried x 6 years has a daughter in [place] area who is a Psychologist, clinical and a daughter in [place] 4 grand kids near by. Retired Development worker, international aid  Rare palpitations or self limited PAF  ROS All other systems reviewed and negative except as noted above  Past Medical History:  Diagnosis Date  . Atrial fibrillation ([place])   . Gout   . Hypertension   . PTSD (post-traumatic stress disorder)     Family History  Problem Relation Age of Onset  . Alzheimer's disease Mother   . Diabetes Mother     Social History   Socioeconomic History  . Marital status: Married    Spouse name: Not on file   . Number of children: Not on file  . Years of education: Not on file  . Highest education level: Not on file  Occupational History  . Not on file  Tobacco Use  . Smoking status: Never Smoker  . Smokeless tobacco: Never Used  Vaping Use  . Vaping Use: Never used  Substance and Sexual Activity  . Alcohol use: Not on file  . Drug use: Not on file  . Sexual activity: Not on file  Other Topics Concern  . Not on file  Social History Narrative  . Not on file   Social Determinants of Health   Financial Resource Strain: Low Risk   . Difficulty of Paying Living Expenses: Not hard at all  Food Insecurity: No Food Insecurity  . Worried About Charity fundraiser in the Last Year: Never true  . Ran Out of Food in the Last Year: Never true  Transportation Needs: No Transportation Needs  . Lack of Transportation (Medical): No  . Lack of Transportation (Non-Medical): No  Physical Activity: Insufficiently Active  . Days of Exercise per Week: 3 days  . Minutes of Exercise per Session: 30 min  Stress: No Stress Concern Present  . Feeling of Stress : Not at all  Social Connections: Moderately Isolated  . Frequency of Communication with Friends and Family: More than three times a week  . Frequency of Social Gatherings with Friends and Family: Once a week  .  Attends Religious Services: Never  . Active Member of Clubs or Organizations: No  . Attends Archivist Meetings: Never  . Marital Status: Married  Human resources officer Violence: Not At Risk  . Fear of Current or Ex-Partner: No  . Emotionally Abused: No  . Physically Abused: No  . Sexually Abused: No    Past Surgical History:  Procedure Laterality Date  . KIDNEY TRANSPLANT  2013  . REPLACEMENT TOTAL KNEE    . SKIN CANCER EXCISION    . TOTAL SHOULDER REPLACEMENT        Current Outpatient Medications:  .  cycloSPORINE modified (NEORAL) 25 MG capsule, Take by mouth. Take 3 tablets PO BID, Disp: , Rfl:  .  diltiazem (DILACOR  XR) 240 MG 24 hr capsule, Take 240 mg by mouth daily., Disp: , Rfl:  .  escitalopram (LEXAPRO) 10 MG tablet, Take 1 tablet (10 mg total) by mouth daily., Disp: 30 tablet, Rfl: 3 .  ezetimibe (ZETIA) 10 MG tablet, Take 1 tablet (10 mg total) by mouth daily., Disp: 90 tablet, Rfl: 1 .  furosemide (LASIX) 40 MG tablet, Take 1 tablet (40 mg total) by mouth daily., Disp: 90 tablet, Rfl: 1 .  metoprolol tartrate (LOPRESSOR) 25 MG tablet, Take 25 mg by mouth 2 (two) times daily., Disp: , Rfl:  .  mycophenolate (MYFORTIC) 180 MG EC tablet, Take 180 mg by mouth 2 (two) times daily., Disp: , Rfl:  .  niacinamide 500 MG tablet, Take 500 mg by mouth 2 (two) times daily with a meal., Disp: , Rfl:  .  omega-3 acid ethyl esters (LOVAZA) 1 g capsule, Take by mouth. Take 2 tabs PO BID., Disp: , Rfl:  .  omeprazole (PRILOSEC) 40 MG capsule, Take 40 mg by mouth daily., Disp: , Rfl:  .  Rivaroxaban (XARELTO) 15 MG TABS tablet, Take 15 mg by mouth daily with supper., Disp: , Rfl:  .  sodium bicarbonate 650 MG tablet, Take 650 mg by mouth 2 (two) times daily., Disp: , Rfl:  .  tamsulosin (FLOMAX) 0.4 MG CAPS capsule, Take 0.4 mg by mouth daily., Disp: , Rfl:  .  UNABLE TO FIND, Vitamin D 10 mcg. Take 1 tablet PO BID., Disp: , Rfl:  .  hydrALAZINE (APRESOLINE) 25 MG tablet, Take 1 tablet (25 mg total) by mouth 3 (three) times daily., Disp: 270 tablet, Rfl: 2    Physical Exam: Blood pressure (!) 108/58, pulse 62, height 5\' 5"  (1.651 m), weight 74.4 kg, SpO2 96 %.    Affect appropriate Healthy:  appears stated age 73: normal Neck supple with no adenopathy JVP normal no bruits no thyromegaly Lungs clear with no wheezing and good diaphragmatic motion Heart:  S1/S2 no murmur, no rub, gallop or click PMI normal Abdomen: benighn, BS positve, no tenderness, no AAA no bruit.  No HSM or HJR post transplant  Distal pulses intact with no bruits No edema Neuro non-focal Skin warm and dry No muscular weakness     Radiology: No results found.  EKG: NSR rate 56 noraml    ASSESSMENT AND PLAN:   1. PAF: on xarelto tends toward bradycardia Junctional when hyperkalemic TTE ordered  Continue current meds  2. HTN:  Well controlled.  Continue current medications and low sodium Dash type diet.   3. CRF: post transplant has f/u with nephrology locally Cr seems to have jumped this past year with baseline 2.6 now  Immunosuppressed has had flu shot and vaccines  4. PTSD:  Started  on Lexapro by primary  5. Prostatism:  On flomax  6. HLD: no history of CAD intolerant to statins on zetia labs with primary   Echo for chronic afib F/U in 6 months   Signed: Jenkins Rouge 05/23/2020, 9:39 AM

## 2020-05-22 ENCOUNTER — Other Ambulatory Visit (HOSPITAL_COMMUNITY): Payer: Self-pay | Admitting: Nephrology

## 2020-05-22 DIAGNOSIS — N186 End stage renal disease: Secondary | ICD-10-CM

## 2020-05-22 DIAGNOSIS — Z940 Kidney transplant status: Secondary | ICD-10-CM

## 2020-05-22 DIAGNOSIS — N1831 Chronic kidney disease, stage 3a: Secondary | ICD-10-CM

## 2020-05-23 ENCOUNTER — Ambulatory Visit (INDEPENDENT_AMBULATORY_CARE_PROVIDER_SITE_OTHER): Payer: Medicare Other | Admitting: Cardiovascular Disease

## 2020-05-23 ENCOUNTER — Other Ambulatory Visit: Payer: Self-pay

## 2020-05-23 ENCOUNTER — Encounter: Payer: Self-pay | Admitting: Cardiovascular Disease

## 2020-05-23 VITALS — BP 108/58 | HR 62 | Ht 65.0 in | Wt 164.0 lb

## 2020-05-23 DIAGNOSIS — I480 Paroxysmal atrial fibrillation: Secondary | ICD-10-CM | POA: Diagnosis not present

## 2020-05-23 DIAGNOSIS — I10 Essential (primary) hypertension: Secondary | ICD-10-CM

## 2020-05-23 MED ORDER — HYDRALAZINE HCL 25 MG PO TABS: 25.0000 mg | ORAL_TABLET | Freq: Three times a day (TID) | ORAL | 2 refills | Status: DC

## 2020-05-23 NOTE — Patient Instructions (Signed)
Medication Instructions:  Your physician recommends that you continue on your current medications as directed. Please refer to the Current Medication list given to you today.  *If you need a refill on your cardiac medications before your next appointment, please call your pharmacy*   Lab Work: None today If you have labs (blood work) drawn today and your tests are completely normal, you will receive your results only by: Marland Kitchen MyChart Message (if you have MyChart) OR . A paper copy in the mail If you have any lab test that is abnormal or we need to change your treatment, we will call you to review the results.   Testing/Procedures: Your physician has requested that you have an echocardiogram. Echocardiography is a painless test that uses sound waves to create images of your heart. It provides your doctor with information about the size and shape of your heart and how well your heart's chambers and valves are working. This procedure takes approximately one hour. There are no restrictions for this procedure.     Follow-Up: At [facility], you and your health needs are our priority.  As part of our continuing mission to provide you with exceptional heart care, we have created designated Provider Care Teams.  These Care Teams include your primary Cardiologist (physician) and Advanced Practice Providers (APPs -  Physician Assistants and Nurse Practitioners) who all work together to provide you with the care you need, when you need it.  We recommend signing up for the patient portal called "MyChart".  Sign up information is provided on this After Visit Summary.  MyChart is used to connect with patients for Virtual Visits (Telemedicine).  Patients are able to view lab/test results, encounter notes, upcoming appointments, etc.  Non-urgent messages can be sent to your provider as well.   To learn more about what you can do with MyChart, go to [email or web address].    Your next appointment:   6  month(s)  The format for your next appointment:   In Person  Provider:   You may see Jenkins Rouge, MD     Other Instructions  None

## 2020-05-29 ENCOUNTER — Ambulatory Visit (HOSPITAL_COMMUNITY)
Admission: RE | Admit: 2020-05-29 | Discharge: 2020-05-29 | Disposition: A | Discharge status: Home / Self Care | Payer: Medicare Other | Source: Ambulatory Visit | Attending: Nephrology | Admitting: Nephrology

## 2020-05-29 ENCOUNTER — Other Ambulatory Visit: Payer: Self-pay

## 2020-05-29 ENCOUNTER — Other Ambulatory Visit (HOSPITAL_COMMUNITY): Payer: Self-pay | Admitting: Nephrology

## 2020-05-29 DIAGNOSIS — N1831 Chronic kidney disease, stage 3a: Secondary | ICD-10-CM | POA: Diagnosis present

## 2020-05-29 DIAGNOSIS — N186 End stage renal disease: Secondary | ICD-10-CM

## 2020-05-29 DIAGNOSIS — Z940 Kidney transplant status: Secondary | ICD-10-CM | POA: Insufficient documentation

## 2020-06-02 ENCOUNTER — Other Ambulatory Visit: Payer: Self-pay | Admitting: Internal Medicine

## 2020-06-02 ENCOUNTER — Telehealth: Payer: Self-pay

## 2020-06-02 DIAGNOSIS — M109 Gout, unspecified: Secondary | ICD-10-CM

## 2020-06-02 MED ORDER — COLCHICINE 0.6 MG PO TABS: ORAL_TABLET | ORAL | 0 refills | Status: DC

## 2020-06-02 NOTE — Telephone Encounter (Signed)
Pt called stating he recently seen you for a gout flare up, he is out of that medication and wants to know if you could send him another Rx for that because he is hurting bad again. Please advise.

## 2020-06-02 NOTE — Telephone Encounter (Signed)
Sent. Thanks.   

## 2020-06-05 ENCOUNTER — Other Ambulatory Visit: Payer: Self-pay

## 2020-06-05 ENCOUNTER — Ambulatory Visit (HOSPITAL_COMMUNITY)
Admission: RE | Admit: 2020-06-05 | Discharge: 2020-06-05 | Disposition: A | Discharge status: Home / Self Care | Payer: Medicare Other | Source: Ambulatory Visit | Attending: Cardiovascular Disease | Admitting: Cardiovascular Disease

## 2020-06-05 DIAGNOSIS — I480 Paroxysmal atrial fibrillation: Secondary | ICD-10-CM | POA: Insufficient documentation

## 2020-06-05 LAB — ECHOCARDIOGRAM COMPLETE
AR max vel: 2.94 cm2
AV Area VTI: 2.98 cm2
AV Area mean vel: 2.63 cm2
AV Mean grad: 2.9 mmHg
AV Peak grad: 6.3 mmHg
Ao pk vel: 1.25 m/s
Area-P 1/2: 2.63 cm2
P 1/2 time: 698 msec
S' Lateral: 2.8 cm

## 2020-06-05 NOTE — Progress Notes (Signed)
*  PRELIMINARY RESULTS* Echocardiogram 2D Echocardiogram has been performed.  Cristian Henderson 06/05/2020, 3:46 PM

## 2020-06-26 NOTE — Progress Notes (Unsigned)
CARDIOLOGY CONSULT NOTE       Patient ID: Cristian Henderson MRN: [id number] DOB/AGE: Jul 20, 1945 75 y.o.  Admit date: (Not on file) Referring Physician: [name] Primary Physician: [name], MD Primary Cardiologist: New Reason for Consultation: PAF   HPI:  75 y.o. referred by Dr [name] for PAF. History of HTN, Gout and PTSD He is post renal transplant 2013 Recent gout exacerbation in feet despite steroids. Started on colchicine 05/08/20 Recently moved from [place] PAF with bradycardia in setting hyperkalemia and worsening Cr  He is immunosuppressed with mycophenolate and cyclosporine Steroids recently for gout  Intolerant to statins with myalgias on zetia  Started on Lexapro for PTSD previously in [employer]   Reviewed over 70 pages notes from [place] [address]  Dr [name]  Phone [phone] Fax [phone]  Ex myovue: 09/24/18 normal EF 67% Echo 11/06/18 Normal EF 55-60% mild LVH mild AR/MR LA 39 mm   Baseline Cr 2.6 Hct 34.9 PLT 166   Admitted June 2020  with CHF and new onset afib converted with cardizem CHADVASC 2  Felt OSA contributing to dyspnea and PAF  Admitted 01/12/19 with junctional rhythm and hyperkalemia with worsening renal function  Amlodipine d/c cardizem increased to 240 mg ? Zio patch given for a week   Remarried x 6 years has a daughter in [place] area who is a Psychologist, clinical and a daughter in [place] 4 grand kids near by. Retired Development worker, international aid  Rare palpitations or self limited PAF TTE 06/05/20 EF 55-60% mild / moderate AR  Has had low BP/HR over last week. Review of his meds indicates he was on lopressor, lasix and hydralazine All put on hold.   ROS All other systems reviewed and negative except as noted above  Past Medical History:  Diagnosis Date  . Atrial fibrillation ([place])   . Gout   . Hypertension   . PTSD (post-traumatic stress disorder)     Family History  Problem Relation Age of Onset  . Alzheimer's disease Mother   .  Diabetes Mother     Social History   Socioeconomic History  . Marital status: Married    Spouse name: Not on file  . Number of children: Not on file  . Years of education: Not on file  . Highest education level: Not on file  Occupational History  . Not on file  Tobacco Use  . Smoking status: Never Smoker  . Smokeless tobacco: Never Used  Vaping Use  . Vaping Use: Never used  Substance and Sexual Activity  . Alcohol use: Not on file  . Drug use: Not on file  . Sexual activity: Not on file  Other Topics Concern  . Not on file  Social History Narrative  . Not on file   Social Determinants of Health   Financial Resource Strain: Low Risk   . Difficulty of Paying Living Expenses: Not hard at all  Food Insecurity: No Food Insecurity  . Worried About Charity fundraiser in the Last Year: Never true  . Ran Out of Food in the Last Year: Never true  Transportation Needs: No Transportation Needs  . Lack of Transportation (Medical): No  . Lack of Transportation (Non-Medical): No  Physical Activity: Insufficiently Active  . Days of Exercise per Week: 3 days  . Minutes of Exercise per Session: 30 min  Stress: No Stress Concern Present  . Feeling of Stress : Not at all  Social Connections: Moderately Isolated  . Frequency of Communication with Friends  and Family: More than three times a week  . Frequency of Social Gatherings with Friends and Family: Once a week  . Attends Religious Services: Never  . Active Member of Clubs or Organizations: No  . Attends Archivist Meetings: Never  . Marital Status: Married  Human resources officer Violence: Not At Risk  . Fear of Current or Ex-Partner: No  . Emotionally Abused: No  . Physically Abused: No  . Sexually Abused: No    Past Surgical History:  Procedure Laterality Date  . KIDNEY TRANSPLANT  2013  . REPLACEMENT TOTAL KNEE    . SKIN CANCER EXCISION    . TOTAL SHOULDER REPLACEMENT        Current Outpatient Medications:  .   colchicine 0.6 MG tablet, Take 1 tablet (0.6 mg total) by mouth daily. Please take 1 tablet on first day, followed by 1 tablet 1 hour later. Then 1 tablet once everyday until gout flare resolves., Disp: 15 tablet, Rfl: 0 .  cycloSPORINE modified (NEORAL) 25 MG capsule, Take by mouth. Take 3 tablets PO BID, Disp: , Rfl:  .  diltiazem (DILACOR XR) 240 MG 24 hr capsule, Take 240 mg by mouth daily., Disp: , Rfl:  .  escitalopram (LEXAPRO) 10 MG tablet, Take 1 tablet (10 mg total) by mouth daily., Disp: 30 tablet, Rfl: 3 .  ezetimibe (ZETIA) 10 MG tablet, Take 1 tablet (10 mg total) by mouth daily., Disp: 90 tablet, Rfl: 1 .  furosemide (LASIX) 40 MG tablet, Take 1 tablet (40 mg total) by mouth daily., Disp: 90 tablet, Rfl: 1 .  hydrALAZINE (APRESOLINE) 25 MG tablet, Take 1 tablet (25 mg total) by mouth 3 (three) times daily., Disp: 270 tablet, Rfl: 2 .  metoprolol tartrate (LOPRESSOR) 25 MG tablet, Take 25 mg by mouth 2 (two) times daily., Disp: , Rfl:  .  mycophenolate (MYFORTIC) 180 MG EC tablet, Take 180 mg by mouth 2 (two) times daily., Disp: , Rfl:  .  omega-3 acid ethyl esters (LOVAZA) 1 g capsule, Take by mouth. Take 2 tabs PO BID., Disp: , Rfl:  .  omeprazole (PRILOSEC) 40 MG capsule, Take 40 mg by mouth daily., Disp: , Rfl:  .  Rivaroxaban (XARELTO) 15 MG TABS tablet, Take 15 mg by mouth daily with supper., Disp: , Rfl:  .  sodium bicarbonate 650 MG tablet, Take 650 mg by mouth 2 (two) times daily., Disp: , Rfl:  .  tamsulosin (FLOMAX) 0.4 MG CAPS capsule, Take 0.4 mg by mouth daily., Disp: , Rfl:  .  UNABLE TO FIND, Vitamin D 10 mcg. Take 1 tablet PO BID., Disp: , Rfl:     Physical Exam: Blood pressure (!) 102/46, pulse 71, height 5\' 5"  (1.651 m), weight 74.9 kg, SpO2 97 %.    Affect appropriate Healthy:  appears stated age 34: normal Neck supple with no adenopathy JVP normal no bruits no thyromegaly Lungs clear with no wheezing and good diaphragmatic motion Heart:  S1/S2 no  murmur, no rub, gallop or click PMI normal Abdomen: benighn, BS positve, no tenderness, no AAA no bruit.  No HSM or HJR post transplant  Distal pulses intact with no bruits No edema Neuro non-focal Skin warm and dry No muscular weakness    Radiology: US Renal Transplant w/Doppler  Result Date: 05/29/2020 CLINICAL DATA:  End-stage renal disease, post renal transplantation, history hypertension, gout EXAM: ULTRASOUND OF RENAL TRANSPLANT WITH RENAL DOPPLER ULTRASOUND TECHNIQUE: Ultrasound examination of the renal transplant was performed with gray-scale, color  and duplex doppler evaluation. COMPARISON:  None FINDINGS: Transplant kidney location: RLQ Transplant Kidney: Renal measurements: 9.9 x 6.2 x 5.9 cm = volume: 123mL. Normal cortical thickness. Borderline increased cortical echogenicity. Small cyst identified at mid kidney 14 x 7 x 10 mm. Moderate transplant hydronephrosis. No solid mass or shadowing calcification. Color flow in the main renal artery:  Present Color flow in the main renal vein:  Present Duplex Doppler Evaluation: Main Renal Artery Velocity: 172 cm/sec Main Renal Artery Resistive Index: 0.88 Venous waveform in main renal vein:  Present Intrarenal resistive index in upper pole:  0.77 (normal 0.6-0.8; equivocal 0.8-0.9; abnormal >= 0.9) Intrarenal resistive index in lower pole: 0.74 (normal 0.6-0.8; equivocal 0.8-0.9; abnormal >= 0.9) Bladder: Normal for degree of bladder distention. Other findings: Incidentally noted cholelithiasis. Native kidneys appear markedly atrophic with both demonstrating small simple cysts, largest upper LEFT kidney 2.3 cm greatest size. IMPRESSION: Moderate hydronephrosis of the transplant kidney in the RIGHT lower quadrant. Unremarkable transplant renal artery and vein. Cholelithiasis. Atrophic native kidneys with small BILATERAL cysts. Electronically Signed   By: Lavonia Dana M.D.   On: 05/29/2020 15:06    EKG: NSR rate 56 noraml    ASSESSMENT AND PLAN:    1. PAF: on xarelto tends toward bradycardia Junctional when hyperkalemic Echo ok 06/05/20  2. HTN:  Over medicated and low d/c lopressor, hydralazine and lasix  3. CRF: post transplant has f/u with nephrology locally Cr seems to have jumped this past year with baseline 2.6 now  Immunosuppressed has had flu shot and vaccines See above lasix held  4. PTSD:  Started on Lexapro by primary  5. Prostatism:  On flomax  6. HLD: no history of CAD intolerant to statins on zetia labs with primary  7. 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 Will order monitor to r/o high grade AV block Discussed issues with CRF/Elevated K making rhythm worse and diagnosis of PAF with SSS and possible need for PPM in future. For know will only be on cardizem. 30 day event monitor I suspect his BP and HR will be better off lopressor , hydralazine and lasix    F/U after monitor   Signed: Jenkins Rouge 06/27/2020, 10:42 AM

## 2020-06-27 ENCOUNTER — Ambulatory Visit (INDEPENDENT_AMBULATORY_CARE_PROVIDER_SITE_OTHER): Payer: Medicare Other | Admitting: Cardiovascular Disease

## 2020-06-27 ENCOUNTER — Encounter: Payer: Self-pay | Admitting: Cardiovascular Disease

## 2020-06-27 ENCOUNTER — Other Ambulatory Visit: Payer: Self-pay

## 2020-06-27 ENCOUNTER — Encounter: Payer: Self-pay | Admitting: Radiology

## 2020-06-27 VITALS — BP 102/46 | HR 71 | Ht 65.0 in | Wt 165.2 lb

## 2020-06-27 DIAGNOSIS — I480 Paroxysmal atrial fibrillation: Secondary | ICD-10-CM | POA: Diagnosis not present

## 2020-06-27 DIAGNOSIS — R001 Bradycardia, unspecified: Secondary | ICD-10-CM | POA: Diagnosis not present

## 2020-06-27 NOTE — Progress Notes (Signed)
Enrolled patient for a 30 day [employer] Event Monitor to be mailed to patients home  

## 2020-06-27 NOTE — Patient Instructions (Addendum)
Medication Instructions:  *If you need a refill on your cardiac medications before your next appointment, please call your pharmacy*  Lab Work: If you have labs (blood work) drawn today and your tests are completely normal, you will receive your results only by: Marland Kitchen MyChart Message (if you have MyChart) OR . A paper copy in the mail If you have any lab test that is abnormal or we need to change your treatment, we will call you to review the results.  Testing/Procedures: Your physician has recommended that you wear an event monitor. Event monitors are medical devices that record the heart's electrical activity. Doctors most often [place] these monitors to diagnose arrhythmias. Arrhythmias are problems with the speed or rhythm of the heartbeat. The monitor is a small, portable device. You can wear one while you do your normal daily activities. This is usually used to diagnose what is causing palpitations/syncope (passing out).  Follow-Up: At [facility], you and your health needs are our priority.  As part of our continuing mission to provide you with exceptional heart care, we have created designated Provider Care Teams.  These Care Teams include your primary Cardiologist (physician) and Advanced Practice Providers (APPs -  Physician Assistants and Nurse Practitioners) who all work together to provide you with the care you need, when you need it.  We recommend signing up for the patient portal called "MyChart".  Sign up information is provided on this After Visit Summary.  MyChart is used to connect with patients for Virtual Visits (Telemedicine).  Patients are able to view lab/test results, encounter notes, upcoming appointments, etc.  Non-urgent messages can be sent to your provider as well.   To learn more about what you can do with MyChart, go to [email or web address].    Your next appointment:   2 months  The format for your next appointment:   In Person  Provider:   You may see Jenkins Rouge, MD or one of the following Advanced Practice Providers on your designated Care Team:    Kathyrn Drown, NP

## 2020-06-29 ENCOUNTER — Emergency Department (HOSPITAL_COMMUNITY): Payer: Medicare Other

## 2020-06-29 ENCOUNTER — Encounter (HOSPITAL_COMMUNITY): Payer: Self-pay

## 2020-06-29 ENCOUNTER — Other Ambulatory Visit: Payer: Self-pay

## 2020-06-29 ENCOUNTER — Emergency Department (HOSPITAL_COMMUNITY)
Admission: EM | Admit: 2020-06-29 | Discharge: 2020-06-29 | Disposition: A | Discharge status: Home / Self Care | Payer: Medicare Other | Attending: Emergency Medicine | Admitting: Emergency Medicine

## 2020-06-29 DIAGNOSIS — R079 Chest pain, unspecified: Secondary | ICD-10-CM

## 2020-06-29 DIAGNOSIS — Z96619 Presence of unspecified artificial shoulder joint: Secondary | ICD-10-CM | POA: Insufficient documentation

## 2020-06-29 DIAGNOSIS — N289 Disorder of kidney and ureter, unspecified: Secondary | ICD-10-CM

## 2020-06-29 DIAGNOSIS — D649 Anemia, unspecified: Secondary | ICD-10-CM | POA: Insufficient documentation

## 2020-06-29 DIAGNOSIS — R0789 Other chest pain: Secondary | ICD-10-CM | POA: Diagnosis present

## 2020-06-29 DIAGNOSIS — N178 Other acute kidney failure: Secondary | ICD-10-CM | POA: Diagnosis not present

## 2020-06-29 DIAGNOSIS — I10 Essential (primary) hypertension: Secondary | ICD-10-CM | POA: Diagnosis not present

## 2020-06-29 DIAGNOSIS — Z96659 Presence of unspecified artificial knee joint: Secondary | ICD-10-CM | POA: Insufficient documentation

## 2020-06-29 DIAGNOSIS — K219 Gastro-esophageal reflux disease without esophagitis: Secondary | ICD-10-CM | POA: Diagnosis not present

## 2020-06-29 DIAGNOSIS — Z79899 Other long term (current) drug therapy: Secondary | ICD-10-CM | POA: Diagnosis not present

## 2020-06-29 DIAGNOSIS — Z7901 Long term (current) use of anticoagulants: Secondary | ICD-10-CM | POA: Insufficient documentation

## 2020-06-29 LAB — CBC
HCT: 34.6 % — ABNORMAL LOW (ref 39.0–52.0)
Hemoglobin: 11.8 g/dL — ABNORMAL LOW (ref 13.0–17.0)
MCH: 31.6 pg (ref 26.0–34.0)
MCHC: 34.1 g/dL (ref 30.0–36.0)
MCV: 92.5 fL (ref 80.0–100.0)
Platelets: 173 10*3/uL (ref 150–400)
RBC: 3.74 MIL/uL — ABNORMAL LOW (ref 4.22–5.81)
RDW: 15.5 % (ref 11.5–15.5)
WBC: 5.9 10*3/uL (ref 4.0–10.5)
nRBC: 0 % (ref 0.0–0.2)

## 2020-06-29 LAB — BASIC METABOLIC PANEL
Anion gap: 10 (ref 5–15)
BUN: 33 mg/dL — ABNORMAL HIGH (ref 8–23)
CO2: 19 mmol/L — ABNORMAL LOW (ref 22–32)
Calcium: 8.8 mg/dL — ABNORMAL LOW (ref 8.9–10.3)
Chloride: 108 mmol/L (ref 98–111)
Creatinine, Ser: 1.64 mg/dL — ABNORMAL HIGH (ref 0.61–1.24)
GFR, Estimated: 43 mL/min — ABNORMAL LOW (ref >60)
Glucose, Bld: 186 mg/dL — ABNORMAL HIGH (ref 70–99)
Potassium: 3.9 mmol/L (ref 3.5–5.1)
Sodium: 137 mmol/L (ref 135–145)

## 2020-06-29 LAB — TROPONIN I (HIGH SENSITIVITY)
Troponin I (High Sensitivity): 8 ng/L (ref <18)
Troponin I (High Sensitivity): 8 ng/L (ref <18)

## 2020-06-29 MED ORDER — PANTOPRAZOLE SODIUM 40 MG IV SOLR: 40.0000 mg | Freq: Once | INTRAVENOUS | Status: DC

## 2020-06-29 MED ORDER — PANTOPRAZOLE SODIUM 40 MG PO TBEC: 40.0000 mg | DELAYED_RELEASE_TABLET | Freq: Every day | ORAL | 0 refills | Status: DC

## 2020-06-29 MED ORDER — PANTOPRAZOLE SODIUM 40 MG PO TBEC
40.0000 mg | DELAYED_RELEASE_TABLET | Freq: Once | ORAL | Status: AC
  Administered 2020-06-29: 40 mg via ORAL
  Filled 2020-06-29: qty 1

## 2020-06-29 MED ORDER — ALUM & MAG HYDROXIDE-SIMETH 200-200-20 MG/5ML PO SUSP
30.0000 mL | Freq: Once | ORAL | Status: AC
  Administered 2020-06-29: 30 mL via ORAL
  Filled 2020-06-29: qty 30

## 2020-06-29 NOTE — ED Triage Notes (Signed)
Pt presents via POV c/o chest pain X 1 week. Pt re[ports seeing Cardiology and was told they didn't think it was anything. Pt reports chest all across chest going down left arm. Pt also endorses SOB at this time.

## 2020-06-29 NOTE — Discharge Instructions (Addendum)
Take antacids as needed.  Return if you have any new or concerning symptoms.

## 2020-06-29 NOTE — ED Provider Notes (Signed)
[facility] EMERGENCY DEPARTMENT Provider Note   CSN: [id number] Arrival date & time: 06/29/20  0030     History Chief Complaint  Patient presents with  . Chest Pain    Cristian Henderson is a 75 y.o. male.  The history is provided by the patient.  Chest Pain He has history of hypertension, hyperlipidemia, paroxysmal atrial fibrillation anticoagulated on rivaroxaban, renal transplant and comes in complaining of burning pain across his lower chest which has been intermittent over the last week.  Pain has been as severe as 7/10.  There has been associated dyspnea and nausea but no diaphoresis.  Pain does not radiate.  Tonight, pain seem to be worse when he laid down, but for the rest of the week nothing seemed to make the pain better or worse.  Tonight, he also noted elevated blood pressure of 165/110, and noted that his heart was going fast for him.  Heart rate was in the 90s multiple times that he checked it, normal heart rate for him is in the 60s.  He has not had pain like this previously.  He is a non-smoker and there is no history of diabetes and there is no family history of premature coronary atherosclerosis.  Past Medical History:  Diagnosis Date  . Atrial fibrillation ([place])   . Gout   . Hypertension   . PTSD (post-traumatic stress disorder)     Patient Active Problem List   Diagnosis Date Noted  . Encounter to establish care 04/25/2020  . Paroxysmal atrial fibrillation ([place]) 04/25/2020  . S/P kidney transplant 04/25/2020  . HTN (hypertension) 04/25/2020  . HLD (hyperlipidemia) 04/25/2020  . PTSD (post-traumatic stress disorder) 04/25/2020  . Benign prostatic hyperplasia 04/25/2020  . Acute gout involving toe of right foot 04/25/2020    Past Surgical History:  Procedure Laterality Date  . KIDNEY TRANSPLANT  2013  . REPLACEMENT TOTAL KNEE    . SKIN CANCER EXCISION    . TOTAL SHOULDER REPLACEMENT         Family History  Problem Relation Age of Onset  . Alzheimer's  disease Mother   . Diabetes Mother     Social History   Tobacco Use  . Smoking status: Never Smoker  . Smokeless tobacco: Never Used  Vaping Use  . Vaping Use: Never used  Substance Use Topics  . Alcohol use: Not Currently  . Drug use: Never    Home Medications Prior to Admission medications   Medication Sig Start Date End Date Taking? Authorizing Provider  colchicine 0.6 MG tablet Take 1 tablet (0.6 mg total) by mouth daily. Please take 1 tablet on first day, followed by 1 tablet 1 hour later. Then 1 tablet once everyday until gout flare resolves. 06/02/20   [name], MD  cycloSPORINE modified (NEORAL) 25 MG capsule Take by mouth. Take 3 tablets PO BID    [provider]  diltiazem (DILACOR XR) 240 MG 24 hr capsule Take 240 mg by mouth daily.    [provider]  escitalopram (LEXAPRO) 10 MG tablet Take 1 tablet (10 mg total) by mouth daily. 04/25/20   [name], MD  ezetimibe (ZETIA) 10 MG tablet Take 1 tablet (10 mg total) by mouth daily. 04/25/20   [name], MD  furosemide (LASIX) 40 MG tablet Take 1 tablet (40 mg total) by mouth daily. 04/25/20   [name], MD  hydrALAZINE (APRESOLINE) 25 MG tablet Take 1 tablet (25 mg total) by mouth 3 (three) times  daily. 05/23/20   Josue Hector, MD  metoprolol tartrate (LOPRESSOR) 25 MG tablet Take 25 mg by mouth 2 (two) times daily.    [provider]  mycophenolate (MYFORTIC) 180 MG EC tablet Take 180 mg by mouth 2 (two) times daily.    [provider]  omega-3 acid ethyl esters (LOVAZA) 1 g capsule Take by mouth. Take 2 tabs PO BID.    [provider]  omeprazole (PRILOSEC) 40 MG capsule Take 40 mg by mouth daily.    [provider]  Rivaroxaban (XARELTO) 15 MG TABS tablet Take 15 mg by mouth daily with supper.    [provider]  sodium bicarbonate 650 MG tablet Take 650 mg by mouth 2 (two) times daily.    [provider]  tamsulosin (FLOMAX) 0.4  MG CAPS capsule Take 0.4 mg by mouth daily.    [provider]  UNABLE TO FIND Vitamin D 10 mcg. Take 1 tablet PO BID.    [provider]    Allergies    Doxycycline, Doxycycline hyclate, and Simvastatin  Review of Systems   Review of Systems  Cardiovascular: Positive for chest pain.  All other systems reviewed and are negative.   Physical Exam Updated Vital Signs BP (!) 156/68   Pulse 71   Temp 98 F (36.7 C)   Resp 16   Ht 5\' 5"  (1.651 m)   Wt 74.9 kg   SpO2 98%   BMI 27.48 kg/m   Physical Exam Vitals and nursing note reviewed.   75 year old male, resting comfortably and in no acute distress. Vital signs are significant for elevated blood pressure. Oxygen saturation is 98%, which is normal. Head is normocephalic and atraumatic. PERRLA, EOMI. Oropharynx is clear. Neck is nontender and supple without adenopathy or JVD. Back is nontender and there is no CVA tenderness. Lungs are clear without rales, wheezes, or rhonchi. Chest is nontender. Heart has regular rate and rhythm without murmur. Abdomen is soft, flat, with mild epigastric tenderness.  There is no rebound or guarding.  Renal transplant is palpable in the right lower quadrant and nontender.  There are no other masses or hepatosplenomegaly and peristalsis is normoactive. Extremities have trace edema, full range of motion is present. Skin is warm and dry without rash. Neurologic: Mental status is normal, cranial nerves are intact, there are no motor or sensory deficits.  ED Results / Procedures / Treatments   Labs (all labs ordered are listed, but only abnormal results are displayed) Labs Reviewed  BASIC METABOLIC PANEL - Abnormal; Notable for the following components:      Result Value   CO2 19 (*)    Glucose, Bld 186 (*)    BUN 33 (*)    Creatinine, Ser 1.64 (*)    Calcium 8.8 (*)    GFR, Estimated 43 (*)    All other components within normal limits  CBC - Abnormal; Notable for the  following components:   RBC 3.74 (*)    Hemoglobin 11.8 (*)    HCT 34.6 (*)    All other components within normal limits  TROPONIN I (HIGH SENSITIVITY)  TROPONIN I (HIGH SENSITIVITY)    EKG EKG Interpretation  Date/Time:  Sunday June 29 2020 00:40:36 EDT Ventricular Rate:  71 PR Interval:    QRS Duration: 91 QT Interval:  410 QTC Calculation: 446 R Axis:   36 Text Interpretation: Sinus rhythm Normal ECG No old tracing to compare Confirmed by Delora Fuel ([id number])  on 06/29/2020 12:44:03 AM   Radiology DG Chest Port 1 View  Result Date: 06/29/2020 CLINICAL DATA:  Chest pain x1 with shortness of breath. EXAM: PORTABLE CHEST 1 VIEW COMPARISON:  None. FINDINGS: The heart size and mediastinal contours are within normal limits. Aortic atherosclerosis. No focal consolidation. No significant pleural effusion or visible pneumothorax. Reverse left shoulder arthroplasty. IMPRESSION: 1. No acute cardiopulmonary findings. 2. Aortic atherosclerosis. Electronically Signed   By: Dahlia Bailiff MD   On: 06/29/2020 01:03    Procedures Procedures   Medications Ordered in ED Medications  alum & mag hydroxide-simeth (MAALOX/MYLANTA) 200-200-20 MG/5ML suspension 30 mL (30 mLs Oral Given 06/29/20 0120)  pantoprazole (PROTONIX) EC tablet 40 mg (40 mg Oral Given 06/29/20 0150)    ED Course  I have reviewed the triage vital signs and the nursing notes.  Pertinent labs & imaging results that were available during my care of the patient were reviewed by me and considered in my medical decision making (see chart for details).  MDM Rules/Calculators/A&P Chest pain with pattern most suggestive of GERD.  ECG is normal and chest x-ray is unremarkable.  He does have some risk factors for ACS, but pain is somewhat atypical.  However, his patient risk score on heart pathway is 4, which is elevated.  Old records are reviewed, and he has been evaluated by cardiology for his atrial fibrillation, recent  echocardiogram showed normal ejection fraction and normal diastolic function.  Labs show mild anemia and mild to moderate renal insufficiency which are unchanged from recent values on record.  Troponin is normal.  Glucose is mildly elevated at 186.  Recent outpatient glucose was 102, no other values on record.  Chest x-ray shows no acute cardiopulmonary findings, incidental finding of aortic atherosclerosis.  He was given a dose of Mylanta with good relief of symptoms.  He was then given a dose of pantoprazole.  Repeat troponin is normal.  At this point, it seems that his symptoms are likely secondary to GERD and he is discharged with prescription for pantoprazole.  Follow-up with his PCP and with his cardiologist.  Final Clinical Impression(s) / ED Diagnoses Final diagnoses:  Atypical chest pain  Gastroesophageal reflux disease, unspecified whether esophagitis present  Renal insufficiency  Normochromic normocytic anemia  Chronic anticoagulation    Rx / DC Orders ED Discharge Orders         Ordered    pantoprazole (PROTONIX) 40 MG tablet  Daily        06/29/20 2518           Delora Fuel, MD 98/42/10 [phone]

## 2020-07-04 ENCOUNTER — Ambulatory Visit (INDEPENDENT_AMBULATORY_CARE_PROVIDER_SITE_OTHER): Payer: Medicare Other

## 2020-07-04 ENCOUNTER — Encounter: Payer: Self-pay | Admitting: Cardiovascular Disease

## 2020-07-04 ENCOUNTER — Telehealth: Payer: Self-pay | Admitting: Internal Medicine

## 2020-07-04 DIAGNOSIS — R001 Bradycardia, unspecified: Secondary | ICD-10-CM

## 2020-07-04 DIAGNOSIS — I480 Paroxysmal atrial fibrillation: Secondary | ICD-10-CM

## 2020-07-04 NOTE — Telephone Encounter (Signed)
Called by [employer] to report atrial fibrillation with rapid ventricular response. This lasted for 22 sec at an average rate in the low 100s. Mr. Cristian Henderson reported mild chest "burning" which quickly resolved. Will forward to his primary cardiologist, Dr. Johnsie Cancel.

## 2020-07-07 ENCOUNTER — Telehealth: Payer: Self-pay

## 2020-07-07 NOTE — Telephone Encounter (Signed)
Left message for the pt to call back re: his monitor and to update his med list.   Received Monitor strips on the pt from Day 1 and 2 of 30...Marland KitchenMarland Kitchen per the DOD Dr. Angelena Form notes the pt is in Afib RVR on 07/04/20...   He was last seen by Dr. 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 Will order monitor to r/o high grade AV block Discussed issues with CRF/Elevated K making rhythm worse and diagnosis of PAF with SSS and possible need for PPM in future. For know will only be on cardizem. 30 day event monitor I suspect his BP and HR will be better off lopressor , hydralazine and lasix   Will have scanned in and forwarded to Dr. Johnsie Cancel for review.

## 2020-07-07 NOTE — Telephone Encounter (Signed)
Known PAF

## 2020-07-08 NOTE — Telephone Encounter (Signed)
July 04, 2020    CW  8:31 PM Cristian Miyamoto, MD routed this conversation to Josue Hector, MD    Cristian Miyamoto, MD   CW  8:30 PM Note Called by [employer] to report atrial fibrillation with rapid ventricular response. This lasted for 22 sec at an average rate in the low 100s. [name] reported mild chest "burning" which quickly resolved. Will forward to his primary cardiologist, Dr. Johnsie Cancel.       __________________________________________________  Patient already been contacted by on call provider, see above.

## 2020-07-09 ENCOUNTER — Ambulatory Visit (INDEPENDENT_AMBULATORY_CARE_PROVIDER_SITE_OTHER): Payer: Medicare Other | Admitting: Urology

## 2020-07-09 ENCOUNTER — Other Ambulatory Visit: Payer: Self-pay

## 2020-07-09 ENCOUNTER — Encounter: Payer: Self-pay | Admitting: Urology

## 2020-07-09 VITALS — BP 149/63 | HR 75 | Temp 97.8°F | Ht 65.0 in | Wt 165.0 lb

## 2020-07-09 DIAGNOSIS — N1330 Unspecified hydronephrosis: Secondary | ICD-10-CM

## 2020-07-09 LAB — URINALYSIS, ROUTINE W REFLEX MICROSCOPIC
Bilirubin, UA: NEGATIVE
Glucose, UA: NEGATIVE
Leukocytes,UA: NEGATIVE
Nitrite, UA: NEGATIVE
RBC, UA: NEGATIVE
Specific Gravity, UA: >1.03 — ABNORMAL HIGH (ref 1.005–1.030)
Urobilinogen, Ur: 1 mg/dL (ref 0.2–1.0)
pH, UA: 6 (ref 5.0–7.5)

## 2020-07-09 LAB — MICROSCOPIC EXAMINATION
Epithelial Cells (non renal): NONE SEEN /hpf (ref 0–10)
RBC, Urine: NONE SEEN /hpf (ref 0–2)
Renal Epithel, UA: NONE SEEN /hpf
WBC, UA: NONE SEEN /hpf (ref 0–5)

## 2020-07-09 NOTE — Progress Notes (Signed)
07/09/2020 2:13 PM   Cristian Henderson 04/08/1945 [id number]  Referring provider: Liana Gerold, MD 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 His transplant was at [facility]    PMH: Past Medical History:  Diagnosis Date  . Acid reflux   . Anxiety   . Arthritis   . Atrial fibrillation ([place])   . Depression   . Gout   . High cholesterol   . Hypertension   . Kidney failure   . PTSD (post-traumatic stress disorder)     Surgical History: Past Surgical History:  Procedure Laterality Date  . KIDNEY TRANSPLANT  2013  . REPLACEMENT TOTAL KNEE    . SKIN CANCER EXCISION    . TOTAL SHOULDER REPLACEMENT      Home Medications:  Allergies as of 07/09/2020      Reactions   Doxycycline Hives   Doxycycline Hyclate Hives   Simvastatin Other (See Comments)   Pt states he loss all use of muscles and he was in the hospital for 10 days       Medication List       Accurate as of July 09, 2020  2:13 PM. If you have any questions, ask your nurse or doctor.        STOP taking these medications   colchicine 0.6 MG tablet Stopped by: Nicolette Bang, MD   pantoprazole 40 MG tablet Commonly known as: PROTONIX Stopped by: Nicolette Bang, MD     TAKE these medications   cycloSPORINE modified 25 MG capsule Commonly known as: NEORAL Take by mouth. Take 3 tablets PO BID   diltiazem 240 MG 24 hr capsule Commonly known as: DILACOR XR Take 240 mg by mouth daily.   escitalopram 10 MG tablet Commonly known as: Lexapro Take 1 tablet (10 mg total) by mouth daily.   ezetimibe 10 MG tablet Commonly known as: ZETIA Take 1 tablet (10 mg total) by mouth daily.   furosemide 40 MG  tablet Commonly known as: LASIX Take 1 tablet (40 mg total) by mouth daily.   hydrALAZINE 25 MG tablet Commonly known as: APRESOLINE Take 1 tablet (25 mg total) by mouth 3 (three) times daily.   metoprolol tartrate 25 MG tablet Commonly known as: LOPRESSOR Take 25 mg by mouth 2 (two) times daily.   mycophenolate 180 MG EC tablet Commonly known as: MYFORTIC Take 180 mg by mouth 2 (two) times daily.   omega-3 acid ethyl esters 1 g capsule Commonly known as: LOVAZA Take by mouth. Take 2 tabs PO BID.   Rivaroxaban 15 MG Tabs tablet Commonly known as: XARELTO Take 15 mg by mouth daily with supper.   sodium bicarbonate 650 MG tablet Take 650 mg by mouth 2 (two) times daily.   tamsulosin 0.4 MG Caps capsule Commonly known as: FLOMAX Take 0.4 mg by mouth daily.   UNABLE TO FIND Vitamin D 10 mcg. Take 1 tablet PO BID.       Allergies:  Allergies  Allergen Reactions  . Doxycycline Hives  . Doxycycline Hyclate Hives  . Simvastatin Other (See Comments)    Pt states he loss all use of muscles and he was in the hospital for 10 days     Family History: Family History  Problem Relation Age of Onset  . Alzheimer's disease Mother   . Diabetes Mother     Social History:  reports that he has never smoked. He has never used smokeless tobacco. He reports previous alcohol use. He reports that he does not use drugs.  ROS: All other review of systems were reviewed and are negative except what is noted above in HPI  Physical Exam: BP (!) 149/63   Pulse 75   Temp 97.8 F (36.6 C)   Ht 5\' 5"  (1.651 m)   Wt 165 lb (74.8 kg)   BMI 27.46 kg/m   Constitutional:  Alert and oriented, No acute distress. HEENT: [place] AT, moist mucus membranes.  Trachea midline, no masses. Cardiovascular: No clubbing, cyanosis, or edema. Respiratory: Normal respiratory effort, no increased work of breathing. GI: Abdomen is soft, nontender, nondistended, no abdominal masses GU: No CVA tenderness.   Lymph: No cervical or inguinal lymphadenopathy. Skin: No rashes, bruises or suspicious lesions. Neurologic: Grossly intact, no focal deficits, moving all 4 extremities. Psychiatric: Normal mood and affect.  Laboratory Data: Lab Results  Component Value Date   WBC 5.9 06/29/2020   HGB 11.8 (L) 06/29/2020   HCT 34.6 (L) 06/29/2020   MCV 92.5 06/29/2020   PLT 173 06/29/2020    Lab Results  Component Value Date   CREATININE 1.64 (H) 06/29/2020    No results found for: PSA  No results found for: TESTOSTERONE  No results found for: HGBA1C  Urinalysis No results found for: COLORURINE, APPEARANCEUR, LABSPEC, PHURINE, GLUCOSEU, HGBUR, BILIRUBINUR, KETONESUR, PROTEINUR, UROBILINOGEN, NITRITE, LEUKOCYTESUR  No results found for: LABMICR, [place], RBCUA, LABEPIT, MUCUS, BACTERIA  Pertinent Imaging: Renal US 05/29/2020: Images reviewed and discussed with the patient No results found for this or any previous visit.  No results found for this or any previous visit.  No results found for this or any previous visit.  No results found for this or any previous visit.  No results found for this or any previous visit.  No results found for this or any previous visit.  No results found for this or any previous visit.  No results found for this or any previous visit.   Assessment & Plan:    1. Hydronephrosis, unspecified hydronephrosis type -We discussed management including indwelling ureteral stent placement versus nephrostomy tube placement and the patient elects for ureteral stent placement - Urinalysis, Routine w reflex microscopic   No follow-ups on file.  Nicolette Bang, MD  [facility] [place]

## 2020-07-09 NOTE — H&P (View-Only) (Signed)
07/09/2020 2:13 PM   Cristian Henderson November 12, 1945 [id number]  Referring provider: Liana Gerold, MD 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 His transplant was at [facility]    PMH: Past Medical History:  Diagnosis Date  . Acid reflux   . Anxiety   . Arthritis   . Atrial fibrillation ([place])   . Depression   . Gout   . High cholesterol   . Hypertension   . Kidney failure   . PTSD (post-traumatic stress disorder)     Surgical History: Past Surgical History:  Procedure Laterality Date  . KIDNEY TRANSPLANT  2013  . REPLACEMENT TOTAL KNEE    . SKIN CANCER EXCISION    . TOTAL SHOULDER REPLACEMENT      Home Medications:  Allergies as of 07/09/2020      Reactions   Doxycycline Hives   Doxycycline Hyclate Hives   Simvastatin Other (See Comments)   Pt states he loss all use of muscles and he was in the hospital for 10 days       Medication List       Accurate as of July 09, 2020  2:13 PM. If you have any questions, ask your nurse or doctor.        STOP taking these medications   colchicine 0.6 MG tablet Stopped by: Nicolette Bang, MD   pantoprazole 40 MG tablet Commonly known as: PROTONIX Stopped by: Nicolette Bang, MD     TAKE these medications   cycloSPORINE modified 25 MG capsule Commonly known as: NEORAL Take by mouth. Take 3 tablets PO BID   diltiazem 240 MG 24 hr capsule Commonly known as: DILACOR XR Take 240 mg by mouth daily.   escitalopram 10 MG tablet Commonly known as: Lexapro Take 1 tablet (10 mg total) by mouth daily.   ezetimibe 10 MG tablet Commonly known as: ZETIA Take 1 tablet (10 mg total) by mouth daily.   furosemide 40 MG  tablet Commonly known as: LASIX Take 1 tablet (40 mg total) by mouth daily.   hydrALAZINE 25 MG tablet Commonly known as: APRESOLINE Take 1 tablet (25 mg total) by mouth 3 (three) times daily.   metoprolol tartrate 25 MG tablet Commonly known as: LOPRESSOR Take 25 mg by mouth 2 (two) times daily.   mycophenolate 180 MG EC tablet Commonly known as: MYFORTIC Take 180 mg by mouth 2 (two) times daily.   omega-3 acid ethyl esters 1 g capsule Commonly known as: LOVAZA Take by mouth. Take 2 tabs PO BID.   Rivaroxaban 15 MG Tabs tablet Commonly known as: XARELTO Take 15 mg by mouth daily with supper.   sodium bicarbonate 650 MG tablet Take 650 mg by mouth 2 (two) times daily.   tamsulosin 0.4 MG Caps capsule Commonly known as: FLOMAX Take 0.4 mg by mouth daily.   UNABLE TO FIND Vitamin D 10 mcg. Take 1 tablet PO BID.       Allergies:  Allergies  Allergen Reactions  . Doxycycline Hives  . Doxycycline Hyclate Hives  . Simvastatin Other (See Comments)    Pt states he loss all use of muscles and he was in the hospital for 10 days     Family History: Family History  Problem Relation Age of Onset  . Alzheimer's disease Mother   . Diabetes Mother     Social History:  reports that he has never smoked. He has never used smokeless tobacco. He reports previous alcohol use. He reports that he does not use drugs.  ROS: All other review of systems were reviewed and are negative except what is noted above in HPI  Physical Exam: BP (!) 149/63   Pulse 75   Temp 97.8 F (36.6 C)   Ht 5\' 5"  (1.651 m)   Wt 165 lb (74.8 kg)   BMI 27.46 kg/m   Constitutional:  Alert and oriented, No acute distress. HEENT: [place] AT, moist mucus membranes.  Trachea midline, no masses. Cardiovascular: No clubbing, cyanosis, or edema. Respiratory: Normal respiratory effort, no increased work of breathing. GI: Abdomen is soft, nontender, nondistended, no abdominal masses GU: No CVA tenderness.   Lymph: No cervical or inguinal lymphadenopathy. Skin: No rashes, bruises or suspicious lesions. Neurologic: Grossly intact, no focal deficits, moving all 4 extremities. Psychiatric: Normal mood and affect.  Laboratory Data: Lab Results  Component Value Date   WBC 5.9 06/29/2020   HGB 11.8 (L) 06/29/2020   HCT 34.6 (L) 06/29/2020   MCV 92.5 06/29/2020   PLT 173 06/29/2020    Lab Results  Component Value Date   CREATININE 1.64 (H) 06/29/2020    No results found for: PSA  No results found for: TESTOSTERONE  No results found for: HGBA1C  Urinalysis No results found for: COLORURINE, APPEARANCEUR, LABSPEC, PHURINE, GLUCOSEU, HGBUR, BILIRUBINUR, KETONESUR, PROTEINUR, UROBILINOGEN, NITRITE, LEUKOCYTESUR  No results found for: LABMICR, [place], RBCUA, LABEPIT, MUCUS, BACTERIA  Pertinent Imaging: Renal US 05/29/2020: Images reviewed and discussed with the patient No results found for this or any previous visit.  No results found for this or any previous visit.  No results found for this or any previous visit.  No results found for this or any previous visit.  No results found for this or any previous visit.  No results found for this or any previous visit.  No results found for this or any previous visit.  No results found for this or any previous visit.   Assessment & Plan:    1. Hydronephrosis, unspecified hydronephrosis type -We discussed management including indwelling ureteral stent placement versus nephrostomy tube placement and the patient elects for ureteral stent placement - Urinalysis, Routine w reflex microscopic   No follow-ups on file.  Nicolette Bang, MD  [facility]rology [place]

## 2020-07-09 NOTE — Progress Notes (Signed)
Urological Symptom Review  Patient is experiencing the following symptoms: Get up at night to urinate Leakage of urine Erection problems (male only)   Review of Systems  Gastrointestinal (upper)  : Indigestion/heartburn  Gastrointestinal (lower) : Negative for lower GI symptoms  Constitutional : Night Sweats Fatigue  Skin: Negative for skin symptoms  Eyes: Negative for eye symptoms  Ear/Nose/Throat : Negative for Ear/Nose/Throat symptoms  Hematologic/Lymphatic: Easy bruising  Cardiovascular : Leg swelling Chest pain  Respiratory : Shortness of breath  Endocrine: Negative for endocrine symptoms  Musculoskeletal: Back pain  Neurological: Headaches Dizziness  Psychologic: PTSD

## 2020-07-14 ENCOUNTER — Telehealth: Payer: Self-pay | Admitting: Urology

## 2020-07-14 NOTE — Telephone Encounter (Signed)
Pt's wife called today wanting to know when he would be having surgery for his stent placement. Please call when you can.

## 2020-07-15 NOTE — Telephone Encounter (Signed)
Returned call. No answer. Left message to return call.  

## 2020-07-17 NOTE — Telephone Encounter (Signed)
Called pt. Spoke with wife and gave dates of pre op, surgery and follow up office appointment

## 2020-07-22 ENCOUNTER — Encounter: Payer: Self-pay | Admitting: Urology

## 2020-07-22 NOTE — Patient Instructions (Signed)
Hydronephrosis  Hydronephrosis is the swelling of one or both kidneys due to a blockage that stops urine from flowing out of the body. Kidneys filter waste from the blood and produce urine. This condition can lead to kidney failure and may become life-threatening if not treated promptly. What are the causes? In infants and children, common causes include problems that occur when a baby is developing in the womb. These can include problems in the kidneys or in the tubes that drain urine into the bladder (ureters). In adults, common causes include:  Kidney stones.  Pregnancy.  A tumor or cyst in the abdomen or pelvis.  An enlarged prostate gland. Other causes include:  Bladder infection.  Scar tissue from a previous surgery or injury.  A blood clot.  Cancer of the prostate, bladder, uterus, ovary, or colon. What are the signs or symptoms? Symptoms of this condition include:  Pain or discomfort in your side (flank) or abdomen.  Swelling in your abdomen.  Nausea and vomiting.  Fever.  Pain when passing urine.  Feelings of urgency when you need to urinate.  Urinating more often than normal. In some cases, you may not have any symptoms. How is this diagnosed? This condition may be diagnosed based on:  Your symptoms and medical history.  A physical exam.  Blood and urine tests.  Imaging tests, such as an ultrasound, CT scan, or MRI.  A procedure to look at your urinary tract and bladder by inserting a scope into the urethra (cystoscopy). How is this treated? Treatment for this condition depends on where the blockage is, how long it has been there, and what caused it. The goal of treatment is to remove the blockage. Treatment may include:  Antibiotic medicines to treat or prevent infection.  A procedure to place a small, thin tube (stent) into a blocked ureter. The stent will keep the ureter open so that urine can drain through it.  A nonsurgical procedure that  crushes kidney stones with shock waves (extracorporeal shock wave lithotripsy).  If kidney failure occurs, treatment may include dialysis or a kidney transplant. Follow these instructions at home:  Take over-the-counter and prescription medicines only as told by your health care provider.  If you were prescribed an antibiotic medicine, take it exactly as told by your health care provider. Do not stop taking the antibiotic even if you start to feel better.  Rest and return to your normal activities as told by your health care provider. Ask your health care provider what activities are safe for you.  Drink enough fluid to keep your urine pale yellow.  Keep all follow-up visits. This is important.   Contact a health care provider if:  You continue to have symptoms after treatment.  You develop new symptoms.  Your urine becomes cloudy or bloody.  You have a fever. Get help right away if:  You have severe flank or abdominal pain.  You cannot drink fluids without vomiting. Summary  Hydronephrosis is the swelling of one or both kidneys due to a blockage that stops urine from flowing out of the body.  Hydronephrosis can lead to kidney failure and may become life-threatening if not treated promptly.  The goal of treatment is to remove the blockage. It may include a procedure to insert a stent into a blocked ureter, a procedure to break up kidney stones, or taking antibiotic medicines.  Follow your health care provider's instructions for taking care of yourself at home, including instructions about drinking fluids, taking   medicines, and limiting activities. This information is not intended to replace advice given to you by your health care provider. Make sure you discuss any questions you have with your health care provider. Document Revised: 07/10/2019 Document Reviewed: 07/10/2019 Elsevier Patient Education  2021 [employer]vier Inc.  

## 2020-07-28 ENCOUNTER — Other Ambulatory Visit: Payer: Self-pay | Admitting: Urology

## 2020-07-28 ENCOUNTER — Emergency Department (HOSPITAL_COMMUNITY): Payer: Medicare Other

## 2020-07-28 ENCOUNTER — Telehealth: Payer: Self-pay

## 2020-07-28 ENCOUNTER — Other Ambulatory Visit: Payer: Self-pay

## 2020-07-28 ENCOUNTER — Emergency Department (HOSPITAL_COMMUNITY)
Admission: EM | Admit: 2020-07-28 | Discharge: 2020-07-28 | Disposition: A | Discharge status: Home / Self Care | Payer: Medicare Other | Attending: Emergency Medicine | Admitting: Emergency Medicine

## 2020-07-28 DIAGNOSIS — I10 Essential (primary) hypertension: Secondary | ICD-10-CM | POA: Insufficient documentation

## 2020-07-28 DIAGNOSIS — Z20822 Contact with and (suspected) exposure to covid-19: Secondary | ICD-10-CM | POA: Insufficient documentation

## 2020-07-28 DIAGNOSIS — N131 Hydronephrosis with ureteral stricture, not elsewhere classified: Secondary | ICD-10-CM

## 2020-07-28 DIAGNOSIS — Z79899 Other long term (current) drug therapy: Secondary | ICD-10-CM | POA: Diagnosis not present

## 2020-07-28 DIAGNOSIS — Z7901 Long term (current) use of anticoagulants: Secondary | ICD-10-CM | POA: Insufficient documentation

## 2020-07-28 DIAGNOSIS — R1033 Periumbilical pain: Secondary | ICD-10-CM | POA: Insufficient documentation

## 2020-07-28 DIAGNOSIS — Z96659 Presence of unspecified artificial knee joint: Secondary | ICD-10-CM | POA: Diagnosis not present

## 2020-07-28 DIAGNOSIS — Z96619 Presence of unspecified artificial shoulder joint: Secondary | ICD-10-CM | POA: Insufficient documentation

## 2020-07-28 DIAGNOSIS — R339 Retention of urine, unspecified: Secondary | ICD-10-CM | POA: Insufficient documentation

## 2020-07-28 DIAGNOSIS — R34 Anuria and oliguria: Secondary | ICD-10-CM | POA: Insufficient documentation

## 2020-07-28 DIAGNOSIS — E860 Dehydration: Secondary | ICD-10-CM

## 2020-07-28 LAB — COMPREHENSIVE METABOLIC PANEL
ALT: 16 U/L (ref 0–44)
AST: 14 U/L — ABNORMAL LOW (ref 15–41)
Albumin: 3.7 g/dL (ref 3.5–5.0)
Alkaline Phosphatase: 64 U/L (ref 38–126)
Anion gap: 8 (ref 5–15)
BUN: 32 mg/dL — ABNORMAL HIGH (ref 8–23)
CO2: 21 mmol/L — ABNORMAL LOW (ref 22–32)
Calcium: 9 mg/dL (ref 8.9–10.3)
Chloride: 105 mmol/L (ref 98–111)
Creatinine, Ser: 1.91 mg/dL — ABNORMAL HIGH (ref 0.61–1.24)
GFR, Estimated: 36 mL/min — ABNORMAL LOW (ref >60)
Glucose, Bld: 100 mg/dL — ABNORMAL HIGH (ref 70–99)
Potassium: 3.8 mmol/L (ref 3.5–5.1)
Sodium: 134 mmol/L — ABNORMAL LOW (ref 135–145)
Total Bilirubin: 0.7 mg/dL (ref 0.3–1.2)
Total Protein: 6.7 g/dL (ref 6.5–8.1)

## 2020-07-28 LAB — CBC WITH DIFFERENTIAL/PLATELET
Abs Immature Granulocytes: 0.03 10*3/uL (ref 0.00–0.07)
Basophils Absolute: 0 10*3/uL (ref 0.0–0.1)
Basophils Relative: 1 %
Eosinophils Absolute: 0.2 10*3/uL (ref 0.0–0.5)
Eosinophils Relative: 4 %
HCT: 35.6 % — ABNORMAL LOW (ref 39.0–52.0)
Hemoglobin: 11.8 g/dL — ABNORMAL LOW (ref 13.0–17.0)
Immature Granulocytes: 1 %
Lymphocytes Relative: 22 %
Lymphs Abs: 1.3 10*3/uL (ref 0.7–4.0)
MCH: 30.3 pg (ref 26.0–34.0)
MCHC: 33.1 g/dL (ref 30.0–36.0)
MCV: 91.3 fL (ref 80.0–100.0)
Monocytes Absolute: 0.7 10*3/uL (ref 0.1–1.0)
Monocytes Relative: 12 %
Neutro Abs: 3.8 10*3/uL (ref 1.7–7.7)
Neutrophils Relative %: 60 %
Platelets: 197 10*3/uL (ref 150–400)
RBC: 3.9 MIL/uL — ABNORMAL LOW (ref 4.22–5.81)
RDW: 15.6 % — ABNORMAL HIGH (ref 11.5–15.5)
WBC: 6 10*3/uL (ref 4.0–10.5)
nRBC: 0 % (ref 0.0–0.2)

## 2020-07-28 LAB — URINALYSIS, ROUTINE W REFLEX MICROSCOPIC
Bilirubin Urine: NEGATIVE
Glucose, UA: NEGATIVE mg/dL
Hgb urine dipstick: NEGATIVE
Ketones, ur: NEGATIVE mg/dL
Leukocytes,Ua: NEGATIVE
Nitrite: NEGATIVE
Protein, ur: NEGATIVE mg/dL
Specific Gravity, Urine: 1.018 (ref 1.005–1.030)
pH: 5 (ref 5.0–8.0)

## 2020-07-28 LAB — RESP PANEL BY RT-PCR (FLU A&B, COVID) ARPGX2
Influenza A by PCR: NEGATIVE
Influenza B by PCR: NEGATIVE
SARS Coronavirus 2 by RT PCR: NEGATIVE

## 2020-07-28 MED ORDER — SODIUM CHLORIDE 0.9 % IV BOLUS
1000.0000 mL | Freq: Once | INTRAVENOUS | Status: AC
  Administered 2020-07-28: 1000 mL via INTRAVENOUS

## 2020-07-28 NOTE — Telephone Encounter (Signed)
Returned pts wife call. She said she was at ER. Dr. Alyson Ingles ordered stat CT.

## 2020-07-28 NOTE — ED Provider Notes (Signed)
Patient with history of renal transplant now unable to void.  Had been coordinating with Dr. Alyson Ingles who had ordered a CT renal scan to be done stat today but patient already in the emergency department.  I spoke with Dr. Alyson Ingles who asked that we order the CT renal protocol CT along with basic metabolic panel.  If the patient has hydronephrosis, will need stat nephrostomy tubes at [facility].    Margette Fast, MD 07/28/20 1430

## 2020-07-28 NOTE — Discharge Instructions (Signed)
Come back tomorrow to get an ultrasound of your kidney.  If radiology does not call you in the morning then you should call here at any [facility] and tell them that you were told to get the ultrasound on Tuesday.  After he have the ultrasound you can have the catheter removed.  Just tell the ultrasound person and they will get someone to help you

## 2020-07-28 NOTE — ED Triage Notes (Signed)
Patient states he has not peed a lot since yesterday afternoon and has only peed 1/3C in 2 days. Patient states he is Supposed to have urinary stent placed 5/5. Patient complaining of increased pubic pressure.

## 2020-07-28 NOTE — ED Provider Notes (Signed)
[facility] EMERGENCY DEPARTMENT Provider Note   CSN: [id number] Arrival date & time: 07/28/20  1345     History Chief Complaint  Patient presents with  . Urinary Retention    Cristian Henderson is a 75 y.o. male.  Patient with difficulty urinating today.  Patient had a renal transplant in 2013.  He spoke with Dr. Alyson Ingles and he was sent to the ED for evaluation  The history is provided by the patient and medical records. No language interpreter was used.  Abdominal Pain Pain location:  Periumbilical Pain quality: not bloating   Pain radiates to:  Does not radiate Pain severity:  No pain Onset quality:  Sudden Timing:  Constant Progression:  Waxing and waning Chronicity:  New Associated symptoms: no chest pain, no cough, no diarrhea, no fatigue and no hematuria        Past Medical History:  Diagnosis Date  . Acid reflux   . Anxiety   . Arthritis   . Atrial fibrillation ([place])   . Depression   . Gout   . High cholesterol   . Hypertension   . Kidney failure   . PTSD (post-traumatic stress disorder)     Patient Active Problem List   Diagnosis Date Noted  . Encounter to establish care 04/25/2020  . Paroxysmal atrial fibrillation ([place]) 04/25/2020  . S/P kidney transplant 04/25/2020  . HTN (hypertension) 04/25/2020  . HLD (hyperlipidemia) 04/25/2020  . PTSD (post-traumatic stress disorder) 04/25/2020  . Benign prostatic hyperplasia 04/25/2020  . Acute gout involving toe of right foot 04/25/2020    Past Surgical History:  Procedure Laterality Date  . KIDNEY TRANSPLANT  2013  . REPLACEMENT TOTAL KNEE    . SKIN CANCER EXCISION    . TOTAL SHOULDER REPLACEMENT         Family History  Problem Relation Age of Onset  . Alzheimer's disease Mother   . Diabetes Mother     Social History   Tobacco Use  . Smoking status: Never Smoker  . Smokeless tobacco: Never Used  Vaping Use  . Vaping Use: Never used  Substance Use Topics  . Alcohol use: Not Currently   . Drug use: Never    Home Medications Prior to Admission medications   Medication Sig Start Date End Date Taking? Authorizing Provider  acetaminophen (TYLENOL) 500 MG tablet Take 1,000 mg by mouth every 6 (six) hours as needed.   Yes [provider]  allopurinol (ZYLOPRIM) 100 MG tablet Take 50 mg by mouth daily.   Yes [provider]  ALPRAZolam (XANAX) 0.25 MG tablet Take 0.25 mg by mouth daily as needed. 02/08/20  Yes [provider]  cholecalciferol (VITAMIN D3) 25 MCG (1000 UNIT) tablet Take 1,000 Units by mouth in the morning and at bedtime.   Yes [provider]  cycloSPORINE modified (NEORAL) 25 MG capsule Take 75 mg by mouth 2 (two) times daily. Take 3 tablets PO BID   Yes [provider]  diltiazem (DILACOR XR) 240 MG 24 hr capsule Take 240 mg by mouth daily.   Yes [provider]  escitalopram (LEXAPRO) 10 MG tablet Take 1 tablet (10 mg total) by mouth daily. 04/25/20  Yes [name], MD  ezetimibe (ZETIA) 10 MG tablet Take 1 tablet (10 mg total) by mouth daily. 04/25/20  Yes [name], MD  mycophenolate (MYFORTIC) 180 MG EC tablet Take 180 mg by mouth 2 (two) times daily.   Yes [provider]  Omega-3 Fatty Acids (  FISH OIL) 1200 MG CAPS Take 2,400 mg by mouth in the morning and at bedtime.   Yes [provider]  omeprazole (PRILOSEC) 40 MG capsule Take 40 mg by mouth daily.   Yes [provider]  pantoprazole (PROTONIX) 40 MG tablet Take 40 mg by mouth daily. 06/29/20  Yes [provider]  Rivaroxaban (XARELTO) 15 MG TABS tablet Take 15 mg by mouth daily with supper.   Yes [provider]  sodium bicarbonate 650 MG tablet Take 650 mg by mouth 2 (two) times daily.   Yes [provider]  tamsulosin (FLOMAX) 0.4 MG CAPS capsule Take 0.4 mg by mouth daily.   Yes [provider]  UNABLE TO FIND Vitamin D 10 mcg. Take 1 tablet PO BID.   Yes [provider]   furosemide (LASIX) 40 MG tablet Take 1 tablet (40 mg total) by mouth daily. Patient not taking: Reported on 07/28/2020 04/25/20   [name], MD  hydrALAZINE (APRESOLINE) 25 MG tablet Take 1 tablet (25 mg total) by mouth 3 (three) times daily. Patient not taking: Reported on 07/28/2020 05/23/20   Josue Hector, MD    Allergies    Doxycycline, Doxycycline hyclate, and Simvastatin  Review of Systems   Review of Systems  Constitutional: Negative for appetite change and fatigue.  HENT: Negative for congestion, ear discharge and sinus pressure.   Eyes: Negative for discharge.  Respiratory: Negative for cough.   Cardiovascular: Negative for chest pain.  Gastrointestinal: Positive for abdominal pain. Negative for diarrhea.  Genitourinary: Negative for frequency and hematuria.       Difficulty urination  Musculoskeletal: Negative for back pain.  Skin: Negative for rash.  Neurological: Negative for seizures and headaches.  Psychiatric/Behavioral: Negative for hallucinations.    Physical Exam Updated Vital Signs BP 139/65   Pulse (!) 54   Temp 98.2 F (36.8 C) (Oral)   Resp 18   Ht 5\' 5"  (1.651 m)   Wt 77.1 kg   SpO2 97%   BMI 28.29 kg/m   Physical Exam Vitals and nursing note reviewed.  Constitutional:      Appearance: He is well-developed.  HENT:     Head: Normocephalic.     Nose: Nose normal.  Eyes:     General: No scleral icterus.    Conjunctiva/sclera: Conjunctivae normal.  Neck:     Thyroid: No thyromegaly.  Cardiovascular:     Rate and Rhythm: Normal rate and regular rhythm.     Heart sounds: No murmur heard. No friction rub. No gallop.   Pulmonary:     Breath sounds: No stridor. No wheezing or rales.  Chest:     Chest wall: No tenderness.  Abdominal:     General: There is no distension.     Tenderness: There is no abdominal tenderness. There is no rebound.  Musculoskeletal:        General: Normal range of motion.     Cervical back: Neck supple.   Lymphadenopathy:     Cervical: No cervical adenopathy.  Skin:    Findings: No erythema or rash.  Neurological:     Mental Status: He is alert and oriented to person, place, and time.     Motor: No abnormal muscle tone.     Coordination: Coordination normal.  Psychiatric:        Behavior: Behavior normal.     ED Results / Procedures / Treatments   Labs (all labs ordered are listed, but only abnormal results are  displayed) Labs Reviewed  COMPREHENSIVE METABOLIC PANEL - Abnormal; Notable for the following components:      Result Value   Sodium 134 (*)    CO2 21 (*)    Glucose, Bld 100 (*)    BUN 32 (*)    Creatinine, Ser 1.91 (*)    AST 14 (*)    GFR, Estimated 36 (*)    All other components within normal limits  CBC WITH DIFFERENTIAL/PLATELET - Abnormal; Notable for the following components:   RBC 3.90 (*)    Hemoglobin 11.8 (*)    HCT 35.6 (*)    RDW 15.6 (*)    All other components within normal limits  RESP PANEL BY RT-PCR (FLU A&B, COVID) [id number]  URINE CULTURE  URINALYSIS, ROUTINE W REFLEX MICROSCOPIC    EKG None  Radiology CT Renal Stone Study  Result Date: 07/28/2020 CLINICAL DATA:  Flank pain, kidney stone suspected Patient reports he is unable to void. Renal transplant 2013. Right lower quadrant and left upper abdominal pain today. EXAM: CT ABDOMEN AND PELVIS WITHOUT CONTRAST TECHNIQUE: Multidetector CT imaging of the abdomen and pelvis was performed following the standard protocol without IV contrast. COMPARISON:  Renal transplant ultrasound 11/26/2020 FINDINGS: Lower chest: Upper normal heart size. There are coronary artery calcifications. No pleural fluid or acute airspace disease. Hepatobiliary: Borderline hepatic steatosis. No discrete focal hepatic lesion on noncontrast exam. Decompressed gallbladder. No calcified gallstone. No biliary dilatation. Pancreas: No ductal dilatation or inflammation. Spleen: Normal in size without focal abnormality.  Adrenals/Urinary Tract: Normal adrenal glands. Marked bilateral native renal atrophy. Low-density lesions arising from both native kidneys measure or simple fluid density and are consistent with cysts. Largest arises from the posterior mid left kidney measures 6.5 cm. No hydronephrosis or ureteral dilatation. Right lower quadrant renal transplant. Mild transplant hydronephrosis but no ureteral dilatation. Minor transplant perinephric edema. No transplant calculi. Possible cyst in the lower pole of the renal transplant, but not well assessed on this noncontrast exam. The urinary bladder is partially distended, no bladder wall thickening or stone. Stomach/Bowel: Unremarkable stomach. Small bowel primarily decompressed. No obstruction or inflammation. Normal appendix. Moderate colonic stool burden. No colonic wall thickening or inflammation. Occasional left colonic diverticula without diverticulitis. Vascular/Lymphatic: Moderate aortic and branch atherosclerosis. No aortic aneurysm no abdominopelvic adenopathy. Reproductive: Enlarged spanning 5.5 cm transverse. Other: Postsurgical change of the anterior abdominal wall. Tiny fat containing umbilical hernia. Minimal fat in both inguinal canals. No ascites, free air, or abdominopelvic fluid collection. Musculoskeletal: Degenerative disc disease at L4-L5 and L5-S1. There are no acute or suspicious osseous abnormalities. IMPRESSION: 1. Right lower quadrant renal transplant. Mild transplant hydronephrosis but no urolithiasis or cause for obstruction. Minor transplant perinephric edema. The degree of hydronephrosis appears improved from ultrasound earlier this year. 2. Marked bilateral native renal atrophy with bilateral renal cysts. 3. Enlarged prostate spanning 5.5 cm. Aortic Atherosclerosis (ICD10-I70.0). Electronically Signed   By: Keith Rake M.D.   On: 07/28/2020 15:58    Procedures Procedures   Medications Ordered in ED Medications  sodium chloride 0.9 %  bolus 1,000 mL (0 mLs Intravenous Stopped 07/28/20 2034)    ED Course  I have reviewed the triage vital signs and the nursing notes.  Pertinent labs & imaging results that were available during my care of the patient were reviewed by me and considered in my medical decision making (see chart for details). I suspect most of his problems related to dehydration.  I spoke with Dr. Jeffie Pollock who felt comfortable  with the patient being discharged home with a Foley in and getting ultrasound tomorrow.   MDM Rules/Calculators/A&P                          Patient with decreased urination.  Patient also has kidney transplant in 2013.  He will get an ultrasound of his kidneys tomorrow and follow-up with urology Final Clinical Impression(s) / ED Diagnoses Final diagnoses:  None    Rx / DC Orders ED Discharge Orders         Ordered    US Renal Transplant w/Doppler        07/28/20 2033           Milton Ferguson, MD 07/29/20 1248

## 2020-07-29 ENCOUNTER — Other Ambulatory Visit: Payer: Self-pay | Admitting: Urology

## 2020-07-29 ENCOUNTER — Other Ambulatory Visit (HOSPITAL_COMMUNITY): Payer: Self-pay | Admitting: Urology

## 2020-07-29 ENCOUNTER — Telehealth: Payer: Self-pay | Admitting: Urology

## 2020-07-29 DIAGNOSIS — N131 Hydronephrosis with ureteral stricture, not elsewhere classified: Secondary | ICD-10-CM

## 2020-07-29 DIAGNOSIS — Q6211 Congenital occlusion of ureteropelvic junction: Secondary | ICD-10-CM

## 2020-07-29 NOTE — Telephone Encounter (Signed)
I called and discussed the CT stone study results with the patients wife. His CT showed mild hydronephrosis which is improved from his previous renal US. The renal US scheduled for today will be cancelled and he will followup on 5/5 for transplant stent placement. H is foley catheter will remain in place until his surgery

## 2020-07-29 NOTE — Progress Notes (Signed)
Wife called the office this am after patient had been discharged from hospital requesting renal u/s to be scheduled  U/S was ordered by a ER MD but not completed or scheduled.  I spoke with Dr. Alyson Ingles to review patient CT from ER.  No CT or Renal U/S needed.   Per Dr. Alyson Ingles "The CT looked good and only showed mild swelling. Creatinine is slightly above baseline and related to urinary retention. We will proceed with stent placement on 5/5. ill call herHe does not need a renal US or a CT

## 2020-07-30 ENCOUNTER — Telehealth: Payer: Self-pay

## 2020-07-30 LAB — URINE CULTURE: Culture: NO GROWTH

## 2020-07-30 NOTE — Telephone Encounter (Signed)
Returned patients call and patient made aware that current catheter will remain in place until procedure on 5/5. Patient voiced understanding.

## 2020-07-30 NOTE — Telephone Encounter (Signed)
Patient needing to know if he needs to have his cath changed before his appt on next Wed.  Call back at:  [phone]    Thanks, Helene Kelp

## 2020-07-31 NOTE — Patient Instructions (Signed)
Your procedure is scheduled on: 08/07/2020  Report to [facility] [facility] at     1:00 PM.  Call this number if you have problems the morning of surgery: [phone]   Remember:   Do not Eat or Drink after midnight         No Smoking the morning of surgery  :  Take these medicines the morning of surgery with A SIP OF WATER: Diltiazem, xanax, lexapro, omeprazole, pantoprazole, flomax and Mycophenolate   Do not wear jewelry, make-up or nail polish.  Do not wear lotions, powders, or perfumes. You may wear deodorant.  Do not shave 48 hours prior to surgery. Men may shave face and neck.  Do not bring valuables to the hospital.  Contacts, dentures or bridgework may not be worn into surgery.  Leave suitcase in the car. After surgery it may be brought to your room.  For patients admitted to the hospital, checkout time is 11:00 AM the day of discharge.   Patients discharged the day of surgery will not be allowed to drive home.    Special Instructions: Shower using CHG night before surgery and shower the day of surgery use CHG.  Use special wash - you have one bottle of CHG for all showers.  You should use approximately 1/2 of the bottle for each shower.  How to Use Chlorhexidine for Bathing Chlorhexidine gluconate (CHG) is a germ-killing (antiseptic) solution that is used to clean the skin. It can get rid of the bacteria that normally live on the skin and can keep them away for about 24 hours. To clean your skin with CHG, you may be given:  A CHG solution to use in the shower or as part of a sponge bath.  A prepackaged cloth that contains CHG. Cleaning your skin with CHG may help lower the risk for infection:  While you are staying in the intensive care unit of the hospital.  If you have a vascular access, such as a central line, to provide short-term or long-term access to your veins.  If you have a catheter to drain urine from your bladder.  If you are on a ventilator. A  ventilator is a machine that helps you breathe by moving air in and out of your lungs.  After surgery. What are the risks? Risks of using CHG include:  A skin reaction.  Hearing loss, if CHG gets in your ears.  Eye injury, if CHG gets in your eyes and is not rinsed out.  The CHG product catching fire. Make sure that you avoid smoking and flames after applying CHG to your skin. Do not use CHG:  If you have a chlorhexidine allergy or have previously reacted to chlorhexidine.  On babies younger than 81 months of age. How to use CHG solution  Use CHG only as told by your health care provider, and follow the instructions on the label.  Use the full amount of CHG as directed. Usually, this is one bottle. During a shower Follow these steps when using CHG solution during a shower (unless your health care provider gives you different instructions): 1. Start the shower. 2. Use your normal soap and shampoo to wash your face and hair. 3. Turn off the shower or move out of the shower stream. 4. Pour the CHG onto a clean washcloth. Do not use any type of brush or rough-edged sponge. 5. Starting at your neck, lather your body down to your toes. Make sure you follow these instructions: ?  If you will be having surgery, pay special attention to the part of your body where you will be having surgery. Scrub this area for at least 1 minute. ? Do not use CHG on your head or face. If the solution gets into your ears or eyes, rinse them well with water. ? Avoid your genital area. ? Avoid any areas of skin that have broken skin, cuts, or scrapes. ? Scrub your back and under your arms. Make sure to wash skin folds. 6. Let the lather sit on your skin for 1-2 minutes or as long as told by your health care provider. 7. Thoroughly rinse your entire body in the shower. Make sure that all body creases and crevices are rinsed well. 8. Dry off with a clean towel. Do not put any substances on your body  afterward--such as powder, lotion, or perfume--unless you are told to do so by your health care provider. Only use lotions that are recommended by the manufacturer. 9. Put on clean clothes or pajamas. 10. If it is the night before your surgery, sleep in clean sheets.   During a sponge bath Follow these steps when using CHG solution during a sponge bath (unless your health care provider gives you different instructions): 1. Use your normal soap and shampoo to wash your face and hair. 2. Pour the CHG onto a clean washcloth. 3. Starting at your neck, lather your body down to your toes. Make sure you follow these instructions: ? If you will be having surgery, pay special attention to the part of your body where you will be having surgery. Scrub this area for at least 1 minute. ? Do not use CHG on your head or face. If the solution gets into your ears or eyes, rinse them well with water. ? Avoid your genital area. ? Avoid any areas of skin that have broken skin, cuts, or scrapes. ? Scrub your back and under your arms. Make sure to wash skin folds. 4. Let the lather sit on your skin for 1-2 minutes or as long as told by your health care provider. 5. Using a different clean, wet washcloth, thoroughly rinse your entire body. Make sure that all body creases and crevices are rinsed well. 6. Dry off with a clean towel. Do not put any substances on your body afterward--such as powder, lotion, or perfume--unless you are told to do so by your health care provider. Only use lotions that are recommended by the manufacturer. 7. Put on clean clothes or pajamas. 8. If it is the night before your surgery, sleep in clean sheets. How to use CHG prepackaged cloths  Only use CHG cloths as told by your health care provider, and follow the instructions on the label.  Use the CHG cloth on clean, dry skin.  Do not use the CHG cloth on your head or face unless your health care provider tells you to.  When washing with  the CHG cloth: ? Avoid your genital area. ? Avoid any areas of skin that have broken skin, cuts, or scrapes. Before surgery Follow these steps when using a CHG cloth to clean before surgery (unless your health care provider gives you different instructions): 1. Using the CHG cloth, vigorously scrub the part of your body where you will be having surgery. Scrub using a back-and-forth motion for 3 minutes. The area on your body should be completely wet with CHG when you are done scrubbing. 2. Do not rinse. Discard the cloth and let the area  air-dry. Do not put any substances on the area afterward, such as powder, lotion, or perfume. 3. Put on clean clothes or pajamas. 4. If it is the night before your surgery, sleep in clean sheets.   For general bathing Follow these steps when using CHG cloths for general bathing (unless your health care provider gives you different instructions). 1. Use a separate CHG cloth for each area of your body. Make sure you wash between any folds of skin and between your fingers and toes. Wash your body in the following order, switching to a new cloth after each step: ? The front of your neck, shoulders, and chest. ? Both of your arms, under your arms, and your hands. ? Your stomach and groin area, avoiding the genitals. ? Your right leg and foot. ? Your left leg and foot. ? The back of your neck, your back, and your buttocks. 2. Do not rinse. Discard the cloth and let the area air-dry. Do not put any substances on your body afterward--such as powder, lotion, or perfume--unless you are told to do so by your health care provider. Only use lotions that are recommended by the manufacturer. 3. Put on clean clothes or pajamas. Contact a health care provider if:  Your skin gets irritated after scrubbing.  You have questions about using your solution or cloth. Get help right away if:  Your eyes become very red or swollen.  Your eyes itch badly.  Your skin itches badly  and is red or swollen.  Your hearing changes.  You have trouble seeing.  You have swelling or tingling in your mouth or throat.  You have trouble breathing.  You swallow any chlorhexidine. Summary  Chlorhexidine gluconate (CHG) is a germ-killing (antiseptic) solution that is used to clean the skin. Cleaning your skin with CHG may help to lower your risk for infection.  You may be given CHG to use for bathing. It may be in a bottle or in a prepackaged cloth to use on your skin. Carefully follow your health care provider's instructions and the instructions on the product label.  Do not use CHG if you have a chlorhexidine allergy.  Contact your health care provider if your skin gets irritated after scrubbing. This information is not intended to replace advice given to you by your health care provider. Make sure you discuss any questions you have with your health care provider. Document Revised: 09/07/2019 Document Reviewed: 09/07/2019 Elsevier Patient Education  2021 [employer].   Ureteral Stent Implantation, Care After This sheet gives you information about how to care for yourself after your procedure. Your health care provider may also give you more specific instructions. If you have problems or questions, contact your health care provider. What can I expect after the procedure? After the procedure, it is common to have:  Nausea.  Mild pain when you urinate. You may feel this pain in your lower back or lower abdomen. The pain should stop within a few minutes after you urinate. This may last for up to 1 week.  A small amount of blood in your urine for several days. Follow these instructions at home: Medicines  Take over-the-counter and prescription medicines only as told by your health care provider.  If you were prescribed an antibiotic medicine, take it as told by your health care provider. Do not stop taking the antibiotic even if you start to feel better.  Do not drive  for 24 hours if you were given a sedative during your  procedure.  Ask your health care provider if the medicine prescribed to you requires you to avoid driving or using heavy machinery. Activity  Rest as told by your health care provider.  Avoid sitting for a long time without moving. Get up to take short walks every 1-2 hours. This is important to improve blood flow and breathing. Ask for help if you feel weak or unsteady.  Return to your normal activities as told by your health care provider. Ask your health care provider what activities are safe for you. General instructions  Watch for any blood in your urine. Call your health care provider if the amount of blood in your urine increases.  If you have a catheter: ? Follow instructions from your health care provider about taking care of your catheter and collection bag. ? Do not take baths, swim, or use a hot tub until your health care provider approves. Ask your health care provider if you may take showers. You may only be allowed to take sponge baths.  Drink enough fluid to keep your urine pale yellow.  Do not use any products that contain nicotine or tobacco, such as cigarettes, e-cigarettes, and chewing tobacco. These can delay healing after surgery. If you need help quitting, ask your health care provider.  Keep all follow-up visits as told by your health care provider. This is important.   Contact a health care provider if:  You have pain that gets worse or does not get better with medicine, especially pain when you urinate.  You have difficulty urinating.  You feel nauseous or you vomit repeatedly during a period of more than 2 days after the procedure. Get help right away if:  Your urine is dark red or has blood clots in it.  You are leaking urine (have incontinence).  The end of the stent comes out of your urethra.  You cannot urinate.  You have sudden, sharp, or severe pain in your abdomen or lower back.  You have  a fever.  You have swelling or pain in your legs.  You have difficulty breathing. Summary  After the procedure, it is common to have mild pain when you urinate that goes away within a few minutes after you urinate. This may last for up to 1 week.  Watch for any blood in your urine. Call your health care provider if the amount of blood in your urine increases.  Take over-the-counter and prescription medicines only as told by your health care provider.  Drink enough fluid to keep your urine pale yellow. This information is not intended to replace advice given to you by your health care provider. Make sure you discuss any questions you have with your health care provider. Document Revised: 12/27/2017 Document Reviewed: 12/28/2017 Elsevier Patient Education  2021 [place].  Urethral Dilation  Urethral dilation is a procedure to stretch open (dilate) the urethra. The urethra is the tube that drains urine from the bladder out of the body. In women, the urethra opens above the vaginal opening. In men, the urethra opens at the tip of the penis. Urethral dilation is usually done to treat narrowing of the urethra (urethral stricture), which can make it difficult to pass urine. Urethral dilation widens the urethra so that you can pass urine normally. Urethral dilation is done through the urethral opening. There are no incisions made during the procedure. Tell a health care provider about:  Any allergies you have.  All medicines you are taking, including vitamins, herbs, eye drops,  creams, and over-the-counter medicines.  Any problems you or family members have had with anesthetic medicines.  Any blood disorders you have.  Any surgeries you have had.  Any medical conditions you have.  Whether you are pregnant or may be pregnant. What are the risks? Generally, this is a safe procedure. However, problems may occur, including:  Bleeding.  Infection.  A return of urethral stricture,  which requires repeating the dilation procedure.  Damage to the urethra, which may require reconstructive surgery.  Allergic reactions to medicines. What happens before the procedure? Medicines Ask your health care provider about:  Changing or stopping your regular medicines. This is especially important if you are taking diabetes medicines or blood thinners.  Taking medicines such as aspirin and ibuprofen. These medicines can thin your blood. Do not take these medicines unless your health care provider tells you to take them.  Taking over-the-counter medicines, vitamins, herbs, and supplements. General instructions  Follow instructions from your health care provider about eating or drinking restrictions.  Plan to have someone take you home from the hospital or clinic.  If you will be going home right after the procedure, plan to have someone with you for 24 hours.  Ask your health care provider what steps will be taken to help prevent infection. These may include: ? Washing skin with a germ-killing soap. ? Taking antibiotic medicine. What happens during the procedure?  An IV may be inserted into one of your veins.  You will be given one or more of the following medicines: ? A local anesthetic to numb your urethral opening. This will be applied as a gel that will also lubricate the urethral opening. ? A sedative to help you relax.  A thin tube with a light and camera on the end (cystoscope) will be inserted into your urethra.  Your urethra will be rinsed (irrigated) with a germ-free (sterile) water solution.  Narrow parts of your urethra will be stretched open using a dilator tool. Your surgeon will start with a very thin dilator, then use wider dilators as needed.  A thin tube with an inflatable balloon on the tip may be inserted into your urethra. The balloon may be inflated to help stretch your urethra open.  Your urethra will be irrigated. The procedure may vary among  health care providers and hospitals. What can I expect after the procedure?  After the procedure, it is common to have: ? Burning pain when urinating. ? Blood in your urine. ? A need to urinate frequently.  You will be asked to urinate before you leave the hospital or clinic.  Your urine flow should improve within a few days. Follow these instructions at home: Medicines  Take over-the-counter and prescription medicines only as told by your health care provider.  If you were prescribed an antibiotic medicine, take it as told by your health care provider. Do not stop taking the antibiotic even if you start to feel better.  Ask your health care provider if the medicine prescribed to you: ? Requires you to avoid driving or using heavy machinery. ? Can cause constipation. You may need to take these actions to prevent or treat constipation:  Take over-the-counter or prescription medicines.  Eat foods that are high in fiber, such as beans, whole grains, and fresh fruits and vegetables.  Limit foods that are high in fat and processed sugars, such as fried or sweet foods. General instructions  Do not drive for 24 hours if you were given a sedative  during your procedure.  If you were sent home with a small, lubricated tube (catheter) to help keep your urethra open, follow your health care provider's instructions about how and when to use it.  Drink enough fluid to keep your urine pale yellow.  Return to your normal activities as told by your health care provider. Ask your health care provider what activities are safe for you.  Keep all follow-up visits as told by your health care provider. This is important. Contact a health care provider if:  Your urine is cloudy and smells bad.  You develop new bleeding when you urinate.  You pass blood clots when you urinate.  You have pain that does not get better with medicine.  You have a fever.  You have swelling, bruising, or  discoloration of your genital area. This includes the penis, scrotum, and inner thighs for men, and the outer genital organs (vulva) and inner thighs for women. Get help right away if:  You develop new bleeding that does not stop.  You cannot pass urine. Summary  Urethral dilation is a procedure to stretch open (dilate) the urethra.  Urethral dilation is usually done to treat narrowing of the urethra (urethral stricture), which can make it difficult to pass urine.  Ask your health care provider about changing or stopping your regular medicines before the procedure.  After the procedure, it is common to have burning pain when urinating, blood in your urine, and a need to urinate frequently. This information is not intended to replace advice given to you by your health care provider. Make sure you discuss any questions you have with your health care provider. Document Revised: 05/04/2018 Document Reviewed: 05/04/2018 Elsevier Patient Education  2021 [place] Anesthesia, Adult, Care After This sheet gives you information about how to care for yourself after your procedure. Your health care provider may also give you more specific instructions. If you have problems or questions, contact your health care provider. What can I expect after the procedure? After the procedure, the following side effects are common:  Pain or discomfort at the IV site.  Nausea.  Vomiting.  Sore throat.  Trouble concentrating.  Feeling cold or chills.  Feeling weak or tired.  Sleepiness and fatigue.  Soreness and body aches. These side effects can affect parts of the body that were not involved in surgery. Follow these instructions at home: For the time period you were told by your health care provider:  Rest.  Do not participate in activities where you could fall or become injured.  Do not drive or use machinery.  Do not drink alcohol.  Do not take sleeping pills or medicines  that cause drowsiness.  Do not make important decisions or sign legal documents.  Do not take care of children on your own.   Eating and drinking  Follow any instructions from your health care provider about eating or drinking restrictions.  When you feel hungry, start by eating small amounts of foods that are soft and easy to digest (bland), such as toast. Gradually return to your regular diet.  Drink enough fluid to keep your urine pale yellow.  If you vomit, rehydrate by drinking water, juice, or clear broth. General instructions  If you have sleep apnea, surgery and certain medicines can increase your risk for breathing problems. Follow instructions from your health care provider about wearing your sleep device: ? Anytime you are sleeping, including during daytime naps. ? While taking prescription pain medicines, sleeping  medicines, or medicines that make you drowsy.  Have a responsible adult stay with you for the time you are told. It is important to have someone help care for you until you are awake and alert.  Return to your normal activities as told by your health care provider. Ask your health care provider what activities are safe for you.  Take over-the-counter and prescription medicines only as told by your health care provider.  If you smoke, do not smoke without supervision.  Keep all follow-up visits as told by your health care provider. This is important. Contact a health care provider if:  You have nausea or vomiting that does not get better with medicine.  You cannot eat or drink without vomiting.  You have pain that does not get better with medicine.  You are unable to pass urine.  You develop a skin rash.  You have a fever.  You have redness around your IV site that gets worse. Get help right away if:  You have difficulty breathing.  You have chest pain.  You have blood in your urine or stool, or you vomit blood. Summary  After the procedure,  it is common to have a sore throat or nausea. It is also common to feel tired.  Have a responsible adult stay with you for the time you are told. It is important to have someone help care for you until you are awake and alert.  When you feel hungry, start by eating small amounts of foods that are soft and easy to digest (bland), such as toast. Gradually return to your regular diet.  Drink enough fluid to keep your urine pale yellow.  Return to your normal activities as told by your health care provider. Ask your health care provider what activities are safe for you. This information is not intended to replace advice given to you by your health care provider. Make sure you discuss any questions you have with your health care provider. Document Revised: 12/06/2019 Document Reviewed: 07/05/2019 Elsevier Patient Education  2021 [employer].

## 2020-08-05 ENCOUNTER — Other Ambulatory Visit (HOSPITAL_COMMUNITY)
Admission: RE | Admit: 2020-08-05 | Discharge: 2020-08-05 | Disposition: A | Discharge status: Home / Self Care | Payer: Medicare Other | Source: Ambulatory Visit | Attending: Urology | Admitting: Urology

## 2020-08-05 ENCOUNTER — Other Ambulatory Visit: Payer: Self-pay

## 2020-08-05 ENCOUNTER — Encounter (HOSPITAL_COMMUNITY): Payer: Self-pay

## 2020-08-05 ENCOUNTER — Encounter (HOSPITAL_COMMUNITY)
Admission: RE | Admit: 2020-08-05 | Discharge: 2020-08-05 | Disposition: A | Discharge status: Home / Self Care | Payer: Medicare Other | Source: Ambulatory Visit | Attending: Urology | Admitting: Urology

## 2020-08-05 DIAGNOSIS — Z01812 Encounter for preprocedural laboratory examination: Secondary | ICD-10-CM | POA: Insufficient documentation

## 2020-08-05 DIAGNOSIS — N390 Urinary tract infection, site not specified: Secondary | ICD-10-CM | POA: Diagnosis not present

## 2020-08-05 DIAGNOSIS — T8613 Kidney transplant infection: Secondary | ICD-10-CM | POA: Diagnosis not present

## 2020-08-05 DIAGNOSIS — Z20822 Contact with and (suspected) exposure to covid-19: Secondary | ICD-10-CM | POA: Insufficient documentation

## 2020-08-05 HISTORY — DX: Cardiac arrhythmia, unspecified: I49.9

## 2020-08-05 LAB — SARS CORONAVIRUS 2 (TAT 6-24 HRS): SARS Coronavirus 2: NEGATIVE

## 2020-08-05 NOTE — Pre-Procedure Instructions (Signed)
In for PAT. Wife has some questions about procedure and there is no mention of Xarelto in history. Called Gibson Ramp at Dr Noland Fordyce office who spoke with Dr Alyson Ingles. Patient  can remain on xarelto for procedure. Wife spoke with Estill Bamberg and all her questions were answered to wife,Sandy's satisfaction. Patient and wife verblaized understanding of all information.

## 2020-08-06 ENCOUNTER — Inpatient Hospital Stay (HOSPITAL_COMMUNITY)
Admission: EM | Admit: 2020-08-06 | Discharge: 2020-08-09 | DRG: 699 | Disposition: A | Discharge status: Home / Self Care | Payer: Medicare Other | Attending: Internal Medicine | Admitting: Internal Medicine

## 2020-08-06 ENCOUNTER — Other Ambulatory Visit: Payer: Self-pay

## 2020-08-06 ENCOUNTER — Emergency Department (HOSPITAL_COMMUNITY): Payer: Medicare Other

## 2020-08-06 ENCOUNTER — Telehealth: Payer: Self-pay

## 2020-08-06 ENCOUNTER — Encounter (HOSPITAL_COMMUNITY): Payer: Self-pay

## 2020-08-06 DIAGNOSIS — E871 Hypo-osmolality and hyponatremia: Secondary | ICD-10-CM | POA: Diagnosis present

## 2020-08-06 DIAGNOSIS — N3000 Acute cystitis without hematuria: Secondary | ICD-10-CM

## 2020-08-06 DIAGNOSIS — T8613 Kidney transplant infection: Principal | ICD-10-CM | POA: Diagnosis present

## 2020-08-06 DIAGNOSIS — N1330 Unspecified hydronephrosis: Secondary | ICD-10-CM

## 2020-08-06 DIAGNOSIS — R509 Fever, unspecified: Secondary | ICD-10-CM | POA: Diagnosis not present

## 2020-08-06 DIAGNOSIS — K219 Gastro-esophageal reflux disease without esophagitis: Secondary | ICD-10-CM | POA: Diagnosis present

## 2020-08-06 DIAGNOSIS — Z79899 Other long term (current) drug therapy: Secondary | ICD-10-CM

## 2020-08-06 DIAGNOSIS — Z888 Allergy status to other drugs, medicaments and biological substances status: Secondary | ICD-10-CM

## 2020-08-06 DIAGNOSIS — Z7901 Long term (current) use of anticoagulants: Secondary | ICD-10-CM

## 2020-08-06 DIAGNOSIS — Z940 Kidney transplant status: Secondary | ICD-10-CM

## 2020-08-06 DIAGNOSIS — N1832 Chronic kidney disease, stage 3b: Secondary | ICD-10-CM | POA: Diagnosis present

## 2020-08-06 DIAGNOSIS — N131 Hydronephrosis with ureteral stricture, not elsewhere classified: Secondary | ICD-10-CM

## 2020-08-06 DIAGNOSIS — D72829 Elevated white blood cell count, unspecified: Secondary | ICD-10-CM

## 2020-08-06 DIAGNOSIS — I482 Chronic atrial fibrillation, unspecified: Secondary | ICD-10-CM

## 2020-08-06 DIAGNOSIS — E7800 Pure hypercholesterolemia, unspecified: Secondary | ICD-10-CM | POA: Diagnosis present

## 2020-08-06 DIAGNOSIS — Z881 Allergy status to other antibiotic agents status: Secondary | ICD-10-CM

## 2020-08-06 DIAGNOSIS — I129 Hypertensive chronic kidney disease with stage 1 through stage 4 chronic kidney disease, or unspecified chronic kidney disease: Secondary | ICD-10-CM | POA: Diagnosis present

## 2020-08-06 DIAGNOSIS — N400 Enlarged prostate without lower urinary tract symptoms: Secondary | ICD-10-CM | POA: Diagnosis present

## 2020-08-06 DIAGNOSIS — Z820 Family history of epilepsy and other diseases of the nervous system: Secondary | ICD-10-CM

## 2020-08-06 DIAGNOSIS — I480 Paroxysmal atrial fibrillation: Secondary | ICD-10-CM | POA: Diagnosis present

## 2020-08-06 DIAGNOSIS — I10 Essential (primary) hypertension: Secondary | ICD-10-CM | POA: Diagnosis present

## 2020-08-06 DIAGNOSIS — Y830 Surgical operation with transplant of whole organ as the cause of abnormal reaction of the patient, or of later complication, without mention of misadventure at the time of the procedure: Secondary | ICD-10-CM | POA: Diagnosis present

## 2020-08-06 DIAGNOSIS — Z833 Family history of diabetes mellitus: Secondary | ICD-10-CM

## 2020-08-06 DIAGNOSIS — N183 Chronic kidney disease, stage 3 unspecified: Secondary | ICD-10-CM

## 2020-08-06 DIAGNOSIS — D631 Anemia in chronic kidney disease: Secondary | ICD-10-CM | POA: Diagnosis present

## 2020-08-06 DIAGNOSIS — I351 Nonrheumatic aortic (valve) insufficiency: Secondary | ICD-10-CM

## 2020-08-06 DIAGNOSIS — N390 Urinary tract infection, site not specified: Secondary | ICD-10-CM | POA: Diagnosis present

## 2020-08-06 DIAGNOSIS — E785 Hyperlipidemia, unspecified: Secondary | ICD-10-CM | POA: Diagnosis present

## 2020-08-06 DIAGNOSIS — M109 Gout, unspecified: Secondary | ICD-10-CM | POA: Diagnosis present

## 2020-08-06 DIAGNOSIS — Z20822 Contact with and (suspected) exposure to covid-19: Secondary | ICD-10-CM | POA: Diagnosis present

## 2020-08-06 DIAGNOSIS — N136 Pyonephrosis: Secondary | ICD-10-CM | POA: Diagnosis present

## 2020-08-06 DIAGNOSIS — M1990 Unspecified osteoarthritis, unspecified site: Secondary | ICD-10-CM | POA: Diagnosis present

## 2020-08-06 DIAGNOSIS — E782 Mixed hyperlipidemia: Secondary | ICD-10-CM

## 2020-08-06 DIAGNOSIS — F4310 Post-traumatic stress disorder, unspecified: Secondary | ICD-10-CM | POA: Diagnosis present

## 2020-08-06 DIAGNOSIS — D6859 Other primary thrombophilia: Secondary | ICD-10-CM | POA: Diagnosis present

## 2020-08-06 DIAGNOSIS — Z85828 Personal history of other malignant neoplasm of skin: Secondary | ICD-10-CM

## 2020-08-06 DIAGNOSIS — T8619 Other complication of kidney transplant: Secondary | ICD-10-CM | POA: Diagnosis present

## 2020-08-06 DIAGNOSIS — B961 Klebsiella pneumoniae [K. pneumoniae] as the cause of diseases classified elsewhere: Secondary | ICD-10-CM | POA: Diagnosis present

## 2020-08-06 LAB — CBC WITH DIFFERENTIAL/PLATELET
Abs Immature Granulocytes: 0.1 10*3/uL — ABNORMAL HIGH (ref 0.00–0.07)
Basophils Absolute: 0.1 10*3/uL (ref 0.0–0.1)
Basophils Relative: 0 %
Eosinophils Absolute: 0.1 10*3/uL (ref 0.0–0.5)
Eosinophils Relative: 1 %
HCT: 36.5 % — ABNORMAL LOW (ref 39.0–52.0)
Hemoglobin: 11.7 g/dL — ABNORMAL LOW (ref 13.0–17.0)
Immature Granulocytes: 1 %
Lymphocytes Relative: 7 %
Lymphs Abs: 1 10*3/uL (ref 0.7–4.0)
MCH: 30.2 pg (ref 26.0–34.0)
MCHC: 32.1 g/dL (ref 30.0–36.0)
MCV: 94.1 fL (ref 80.0–100.0)
Monocytes Absolute: 1.8 10*3/uL — ABNORMAL HIGH (ref 0.1–1.0)
Monocytes Relative: 13 %
Neutro Abs: 10.8 10*3/uL — ABNORMAL HIGH (ref 1.7–7.7)
Neutrophils Relative %: 78 %
Platelets: 208 10*3/uL (ref 150–400)
RBC: 3.88 MIL/uL — ABNORMAL LOW (ref 4.22–5.81)
RDW: 15.2 % (ref 11.5–15.5)
WBC: 13.9 10*3/uL — ABNORMAL HIGH (ref 4.0–10.5)
nRBC: 0.1 % (ref 0.0–0.2)

## 2020-08-06 LAB — URINALYSIS, ROUTINE W REFLEX MICROSCOPIC
Bilirubin Urine: NEGATIVE
Glucose, UA: NEGATIVE mg/dL
Ketones, ur: NEGATIVE mg/dL
Nitrite: POSITIVE — AB
Protein, ur: 30 mg/dL — AB
RBC / HPF: >50 RBC/hpf — ABNORMAL HIGH (ref 0–5)
Specific Gravity, Urine: 1.017 (ref 1.005–1.030)
WBC, UA: >50 WBC/hpf — ABNORMAL HIGH (ref 0–5)
pH: 5 (ref 5.0–8.0)

## 2020-08-06 LAB — COMPREHENSIVE METABOLIC PANEL
ALT: 12 U/L (ref 0–44)
AST: 11 U/L — ABNORMAL LOW (ref 15–41)
Albumin: 3.6 g/dL (ref 3.5–5.0)
Alkaline Phosphatase: 71 U/L (ref 38–126)
Anion gap: 8 (ref 5–15)
BUN: 28 mg/dL — ABNORMAL HIGH (ref 8–23)
CO2: 23 mmol/L (ref 22–32)
Calcium: 8.9 mg/dL (ref 8.9–10.3)
Chloride: 100 mmol/L (ref 98–111)
Creatinine, Ser: 1.76 mg/dL — ABNORMAL HIGH (ref 0.61–1.24)
GFR, Estimated: 40 mL/min — ABNORMAL LOW (ref >60)
Glucose, Bld: 129 mg/dL — ABNORMAL HIGH (ref 70–99)
Potassium: 4.3 mmol/L (ref 3.5–5.1)
Sodium: 131 mmol/L — ABNORMAL LOW (ref 135–145)
Total Bilirubin: 0.7 mg/dL (ref 0.3–1.2)
Total Protein: 7 g/dL (ref 6.5–8.1)

## 2020-08-06 LAB — LACTIC ACID, PLASMA
Lactic Acid, Venous: 1.3 mmol/L (ref 0.5–1.9)
Lactic Acid, Venous: 0.9 mmol/L (ref 0.5–1.9)

## 2020-08-06 MED ORDER — ENOXAPARIN SODIUM 40 MG/0.4ML IJ SOSY: 40.0000 mg | PREFILLED_SYRINGE | INTRAMUSCULAR | Status: DC

## 2020-08-06 MED ORDER — SODIUM CHLORIDE 0.9 % IV SOLN
2.0000 g | Freq: Once | INTRAVENOUS | Status: AC
  Administered 2020-08-06: 2 g via INTRAVENOUS
  Filled 2020-08-06: qty 20

## 2020-08-06 NOTE — ED Provider Notes (Signed)
[facility] EMERGENCY DEPARTMENT Provider Note   CSN: [id number] Arrival date & time: 08/06/20  1821     History Chief Complaint  Patient presents with  . Fever    Cristian Henderson is a 75 y.o. male.  The history is provided by the patient. No language interpreter was used.  Fever Temp source:  Subjective Severity:  Moderate Onset quality:  Gradual Duration:  1 day Timing:  Constant Progression:  Worsening Chronicity:  New Relieved by:  Nothing Worsened by:  Nothing Ineffective treatments:  None tried Associated symptoms: chills   Associated symptoms: no diarrhea   Pt has a foley in place.  He is suppose to have a stent placed tomorrow.  Pt thinks he may have a urinary tract infection.  Pt complains of burning when he urinates.  Pt reports he also is having a flare up of gout.      Past Medical History:  Diagnosis Date  . Acid reflux   . Anxiety   . Arthritis   . Atrial fibrillation ([place])   . Depression   . Dysrhythmia    AFIB  . Gout   . High cholesterol   . Hypertension   . Kidney failure   . PTSD (post-traumatic stress disorder)     Patient Active Problem List   Diagnosis Date Noted  . Encounter to establish care 04/25/2020  . Paroxysmal atrial fibrillation ([place]) 04/25/2020  . S/P kidney transplant 04/25/2020  . HTN (hypertension) 04/25/2020  . HLD (hyperlipidemia) 04/25/2020  . PTSD (post-traumatic stress disorder) 04/25/2020  . Benign prostatic hyperplasia 04/25/2020  . Acute gout involving toe of right foot 04/25/2020    Past Surgical History:  Procedure Laterality Date  . KIDNEY TRANSPLANT Right 2013  . REPLACEMENT TOTAL KNEE Right   . SKIN CANCER EXCISION    . TOTAL SHOULDER REPLACEMENT Left        Family History  Problem Relation Age of Onset  . Alzheimer's disease Mother   . Diabetes Mother     Social History   Tobacco Use  . Smoking status: Never Smoker  . Smokeless tobacco: Never Used  Vaping Use  . Vaping Use: Never used   Substance Use Topics  . Alcohol use: Not Currently  . Drug use: Never    Home Medications Prior to Admission medications   Medication Sig Start Date End Date Taking? Authorizing Provider  acetaminophen (TYLENOL) 500 MG tablet Take 1,000 mg by mouth every 6 (six) hours as needed.    [provider]  allopurinol (ZYLOPRIM) 100 MG tablet Take 50 mg by mouth daily.    [provider]  ALPRAZolam Duanne Moron) 0.25 MG tablet Take 0.25 mg by mouth daily as needed. 02/08/20   [provider]  cholecalciferol (VITAMIN D3) 25 MCG (1000 UNIT) tablet Take 1,000 Units by mouth in the morning and at bedtime.    [provider]  cycloSPORINE modified (NEORAL) 25 MG capsule Take 75 mg by mouth 2 (two) times daily. Take 3 tablets PO BID    [provider]  diltiazem (DILACOR XR) 240 MG 24 hr capsule Take 240 mg by mouth daily.    [provider]  escitalopram (LEXAPRO) 10 MG tablet Take 1 tablet (10 mg total) by mouth daily. 04/25/20   [name], MD  ezetimibe (ZETIA) 10 MG tablet Take 1 tablet (10 mg total) by mouth daily. 04/25/20   [name], MD  furosemide (LASIX) 40 MG tablet Take 1 tablet (40 mg total)  by mouth daily. Patient not taking: Reported on 07/28/2020 04/25/20   [name], MD  hydrALAZINE (APRESOLINE) 25 MG tablet Take 1 tablet (25 mg total) by mouth 3 (three) times daily. Patient not taking: Reported on 07/28/2020 05/23/20   Josue Hector, MD  mycophenolate (MYFORTIC) 180 MG EC tablet Take 180 mg by mouth 2 (two) times daily.    [provider]  Omega-3 Fatty Acids (FISH OIL) 1200 MG CAPS Take 2,400 mg by mouth in the morning and at bedtime.    [provider]  omeprazole (PRILOSEC) 40 MG capsule Take 40 mg by mouth daily.    [provider]  pantoprazole (PROTONIX) 40 MG tablet Take 40 mg by mouth daily. 06/29/20   [provider]  Rivaroxaban (XARELTO) 15 MG TABS tablet Take 15 mg by mouth  daily with supper.    [provider]  sodium bicarbonate 650 MG tablet Take 650 mg by mouth 2 (two) times daily.    [provider]  tamsulosin (FLOMAX) 0.4 MG CAPS capsule Take 0.4 mg by mouth daily.    [provider]  UNABLE TO FIND Vitamin D 10 mcg. Take 1 tablet PO BID.    [provider]    Allergies    Doxycycline, Doxycycline hyclate, and Simvastatin  Review of Systems   Review of Systems  Constitutional: Positive for chills and fever.  Gastrointestinal: Negative for diarrhea.  All other systems reviewed and are negative.   Physical Exam Updated Vital Signs BP 129/70 (BP Location: Right Arm)   Pulse 79   Temp 99.1 F (37.3 C) (Oral)   Resp 18   Ht 5\' 5"  (1.651 m)   Wt 74.8 kg   SpO2 97%   BMI 27.46 kg/m   Physical Exam Vitals and nursing note reviewed.  Constitutional:      Appearance: He is well-developed.  HENT:     Head: Normocephalic and atraumatic.  Eyes:     Conjunctiva/sclera: Conjunctivae normal.  Cardiovascular:     Rate and Rhythm: Normal rate and regular rhythm.     Heart sounds: No murmur heard.   Pulmonary:     Effort: Pulmonary effort is normal. No respiratory distress.     Breath sounds: Normal breath sounds.  Abdominal:     Palpations: Abdomen is soft.     Tenderness: There is no abdominal tenderness.  Musculoskeletal:        General: Normal range of motion.     Cervical back: Neck supple.  Skin:    General: Skin is warm and dry.  Neurological:     Mental Status: He is alert.  Psychiatric:        Mood and Affect: Mood normal.     ED Results / Procedures / Treatments   Labs (all labs ordered are listed, but only abnormal results are displayed) Labs Reviewed  COMPREHENSIVE METABOLIC PANEL - Abnormal; Notable for the following components:      Result Value   Sodium 131 (*)    Glucose, Bld 129 (*)    BUN 28 (*)    Creatinine, Ser 1.76 (*)    AST 11 (*)    GFR, Estimated 40 (*)    All other  components within normal limits  CBC WITH DIFFERENTIAL/PLATELET - Abnormal; Notable for the following components:   WBC 13.9 (*)    RBC 3.88 (*)    Hemoglobin 11.7 (*)    HCT 36.5 (*)    Neutro Abs 10.8 (*)  Monocytes Absolute 1.8 (*)    Abs Immature Granulocytes 0.10 (*)    All other components within normal limits  RESP PANEL BY RT-PCR (FLU A&B, COVID) ARPGX2  URINE CULTURE  LACTIC ACID, PLASMA  LACTIC ACID, PLASMA  URINALYSIS, ROUTINE W REFLEX MICROSCOPIC    EKG None  Radiology DG Chest 2 View  Result Date: 08/06/2020 CLINICAL DATA:  Fever EXAM: CHEST - 2 VIEW COMPARISON:  06/29/2020 FINDINGS: Lungs are well expanded, symmetric, and clear. No pneumothorax or pleural effusion. Cardiac size within normal limits. Pulmonary vascularity is normal. Osseous structures are age-appropriate. Left total shoulder arthroplasty has been performed. No acute bone abnormality. IMPRESSION: No active cardiopulmonary disease. Electronically Signed   By: Fidela Salisbury MD   On: 08/06/2020 19:33   CARDIAC EVENT MONITOR  Result Date: 08/06/2020 Frequent bouts of rapid PAF/Flutter/Fib that Correlate with symptoms of dyspnea and palpitations Refer to EP for further Rx   Procedures Procedures   Medications Ordered in ED Medications - No data to display  ED Course  I have reviewed the triage vital signs and the nursing notes.  Pertinent labs & imaging results that were available during my care of the patient were reviewed by me and considered in my medical decision making (see chart for details).  Clinical Course as of 08/07/20 1224  Wed Aug 06, 2020  2222 Urinalysis does show many bacteria as well as white blood cells and red blood cells. [RF]  7588 Renal function is at baseline [JK]  2222 White blood cell is elevated compared to previous [JK]    Clinical Course User Index [JK] Dorie Rank, MD   MDM Rules/Calculators/A&P                          MDM:   Final Clinical Impression(s) / ED  Diagnoses Final diagnoses:  Acute cystitis without hematuria  Renal transplant recipient    Rx / DC Orders ED Discharge Orders    None       Sidney Ace 08/07/20 1224    Dorie Rank, MD 08/09/20 [phone]

## 2020-08-06 NOTE — H&P (Addendum)
History and Physical  Keeton Kassebaum IRC:[id number] DOB: 1945-07-27 DOA: 08/06/2020  Referring physician: Antony Odea, PA-C PCP: [name], MD  Patient coming from: Home  Chief Complaint: Burning sensation with urination, fever  HPI: Cristian Henderson is a 75 y.o. male with medical history significant for kidney transplant, he has hydronephrosis with ureteral stricture and Foley catheter was placed with plan cystoscopy with retrograde pyelogram and ureteral stent placement tomorrow.  He complained of 2-day onset of burning sensation and discomfort in the bladder area, he endorsed a fever of 102F, he was concerned about UTI, so he presented to the ED for further evaluation.  He denies chest pain, shortness of breath, headache, nausea, vomiting  ED Course:  In the emergency department, he was hemodynamically stable BP was 172/68.  Work-up in the ED showed leukocytosis, hyponatremia, BUN/creatinine 28/1.76 (creatinine with the baseline range).  SARS coronavirus 2 was negative.  Urinalysis was positive for UTI Chest x-ray showed no active cardiopulmonary disease He was treated with IV ceftriaxone. Urologist (Dr. Diona Fanti) was consulted and will notify Dr. Alyson Ingles per ED physician and ED medical record.  Review of Systems: Constitutional: Positive for chills and fever.  HENT: Negative for ear pain and sore throat.   Eyes: Negative for pain and visual disturbance.  Respiratory: Negative for cough, chest tightness and shortness of breath.   Cardiovascular: Negative for chest pain and palpitations.  Gastrointestinal: Negative for abdominal pain and vomiting.  Endocrine: Negative for polyphagia and polyuria.  Genitourinary: Positive for burning sensation bladder discomfort.  Negative for decreased urine volume,  hematuria Musculoskeletal: Negative for arthralgias and back pain.  Skin: Negative for color change and rash.  Allergic/Immunologic: Negative for immunocompromised state.   Neurological: Negative for tremors, syncope, speech difficulty, light-headedness and headaches.  Hematological: Does not bruise/bleed easily.  All other systems reviewed and are negative  Past Medical History:  Diagnosis Date  . Acid reflux   . Anxiety   . Arthritis   . Atrial fibrillation ([place])   . Depression   . Dysrhythmia    AFIB  . Gout   . High cholesterol   . Hypertension   . Kidney failure   . PTSD (post-traumatic stress disorder)    Past Surgical History:  Procedure Laterality Date  . KIDNEY TRANSPLANT Right 2013  . REPLACEMENT TOTAL KNEE Right   . SKIN CANCER EXCISION    . TOTAL SHOULDER REPLACEMENT Left     Social History:  reports that he has never smoked. He has never used smokeless tobacco. He reports previous alcohol use. He reports that he does not use drugs.   Allergies  Allergen Reactions  . Doxycycline Hives  . Doxycycline Hyclate Hives  . Simvastatin Other (See Comments)    Pt states he loss all use of muscles and he was in the hospital for 10 days     Family History  Problem Relation Age of Onset  . Alzheimer's disease Mother   . Diabetes Mother      Prior to Admission medications   Medication Sig Start Date End Date Taking? Authorizing Provider  acetaminophen (TYLENOL) 500 MG tablet Take 1,000 mg by mouth every 6 (six) hours as needed.    [provider]  allopurinol (ZYLOPRIM) 100 MG tablet Take 50 mg by mouth daily.    [provider]  ALPRAZolam Duanne Moron) 0.25 MG tablet Take 0.25 mg by mouth daily as needed. 02/08/20   [provider]  cholecalciferol (VITAMIN D3) 25 MCG (1000 UNIT) tablet  Take 1,000 Units by mouth in the morning and at bedtime.    [provider]  cycloSPORINE modified (NEORAL) 25 MG capsule Take 75 mg by mouth 2 (two) times daily. Take 3 tablets PO BID    [provider]  diltiazem (DILACOR XR) 240 MG 24 hr capsule Take 240 mg by mouth daily.    [provider]   escitalopram (LEXAPRO) 10 MG tablet Take 1 tablet (10 mg total) by mouth daily. 04/25/20   [name], MD  ezetimibe (ZETIA) 10 MG tablet Take 1 tablet (10 mg total) by mouth daily. 04/25/20   [name], MD  furosemide (LASIX) 40 MG tablet Take 1 tablet (40 mg total) by mouth daily. Patient not taking: Reported on 07/28/2020 04/25/20   [name], MD  hydrALAZINE (APRESOLINE) 25 MG tablet Take 1 tablet (25 mg total) by mouth 3 (three) times daily. Patient not taking: Reported on 07/28/2020 05/23/20   Josue Hector, MD  mycophenolate (MYFORTIC) 180 MG EC tablet Take 180 mg by mouth 2 (two) times daily.    [provider]  Omega-3 Fatty Acids (FISH OIL) 1200 MG CAPS Take 2,400 mg by mouth in the morning and at bedtime.    [provider]  omeprazole (PRILOSEC) 40 MG capsule Take 40 mg by mouth daily.    [provider]  pantoprazole (PROTONIX) 40 MG tablet Take 40 mg by mouth daily. 06/29/20   [provider]  Rivaroxaban (XARELTO) 15 MG TABS tablet Take 15 mg by mouth daily with supper.    [provider]  sodium bicarbonate 650 MG tablet Take 650 mg by mouth 2 (two) times daily.    [provider]  tamsulosin (FLOMAX) 0.4 MG CAPS capsule Take 0.4 mg by mouth daily.    [provider]  UNABLE TO FIND Vitamin D 10 mcg. Take 1 tablet PO BID.    [provider]    Physical Exam: BP (!) 142/72 (BP Location: Right Arm)   Pulse 83   Temp 98.3 F (36.8 C) (Oral)   Resp 17   Ht 5\' 5"  (1.651 m)   Wt 74.8 kg   SpO2 97%   BMI 27.46 kg/m   . General: 75 y.o. year-old male well developed well nourished in no acute distress.  Alert and oriented x3. Marland Kitchen HEENT: NCAT, EOMI . Neck: Supple, trachea medial . Cardiovascular: Regular rate and rhythm with no rubs or gallops.  No thyromegaly or JVD noted.  2/4 pulses in all 4 extremities. Marland Kitchen Respiratory: Clear to auscultation with no wheezes or rales. Good inspiratory  effort. . Abdomen: Soft nontender nondistended with normal bowel sounds x4 quadrants. . Muskuloskeletal: No cyanosis, clubbing or edema noted bilaterally . Neuro: CN II-XII intact, sensation, reflexes intact . Skin: No ulcerative lesions noted or rashes . Psychiatry: Mood is appropriate for condition and setting          Labs on Admission:  Basic Metabolic Panel: Recent Labs  Lab 08/06/20 1925 08/07/20 0340  NA 131* 134*  K 4.3 4.3  CL 100 103  CO2 23 24  GLUCOSE 129* 220*  BUN 28* 25*  CREATININE 1.76* 1.52*  CALCIUM 8.9 8.3*  MG  --  1.6*  PHOS  --  2.6   Liver Function Tests: Recent Labs  Lab 08/06/20 1925 08/07/20 0340  AST 11* 17  ALT 12 10  ALKPHOS 71 61  BILITOT 0.7 0.6  PROT 7.0 6.0*  ALBUMIN 3.6 2.9*  No results for input(s): LIPASE, AMYLASE in the last 168 hours. No results for input(s): AMMONIA in the last 168 hours. CBC: Recent Labs  Lab 08/06/20 1925 08/07/20 0340  WBC 13.9* 12.5*  NEUTROABS 10.8*  --   HGB 11.7* 10.7*  HCT 36.5* 31.9*  MCV 94.1 91.7  PLT 208 196   Cardiac Enzymes: No results for input(s): CKTOTAL, CKMB, CKMBINDEX, TROPONINI in the last 168 hours.  BNP (last 3 results) No results for input(s): BNP in the last 8760 hours.  ProBNP (last 3 results) No results for input(s): PROBNP in the last 8760 hours.  CBG: No results for input(s): GLUCAP in the last 168 hours.  Radiological Exams on Admission: DG Chest 2 View  Result Date: 08/06/2020 CLINICAL DATA:  Fever EXAM: CHEST - 2 VIEW COMPARISON:  06/29/2020 FINDINGS: Lungs are well expanded, symmetric, and clear. No pneumothorax or pleural effusion. Cardiac size within normal limits. Pulmonary vascularity is normal. Osseous structures are age-appropriate. Left total shoulder arthroplasty has been performed. No acute bone abnormality. IMPRESSION: No active cardiopulmonary disease. Electronically Signed   By: Fidela Salisbury MD   On: 08/06/2020 19:33   CARDIAC EVENT  MONITOR  Result Date: 08/06/2020 Frequent bouts of rapid PAF/Flutter/Fib that Correlate with symptoms of dyspnea and palpitations Refer to EP for further Rx   EKG: I independently viewed the EKG done and my findings are as followed: EKG was not done in the ED   Assessment/Plan Present on Admission: . 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   Fever and leukocytosis secondary to UTI POA Patient complained of 102F, WBC 13.9 Urinalysis was positive for UTI and patient presented with irritative bladder symptoms He was started on IV ceftriaxone, we shall continue with cellulitis time Continue Tylenol as needed  Urine culture pending  Hyponatremia Na 131, this has improved to 134 Continue to monitor sodium levels with morning labs  CKD stage IIIb BUN/creatinine 28/1.76 (creatinine with the baseline range) Renally adjust medications, avoid nephrotoxic agents/dehydration/hypotension  History of hydronephrosis  with ureteral stricture  Patient states that he has a pending urological procedure with stent placement today Neurologist was consulted and will see patient in the morning per ED physician  Atrial fibrillation Continue Cardizem when patient is able to resume oral intake (currently npo) Xarelto will be held at this time due to impending surgical procedure   Hyperlipidemia Continue Zetia once patient is stable to resume oral intake  Essential hypertension Continue Cardizem when patient is able to resume oral intake   GERD Continue Protonix  DVT prophylaxis: SCDs  Code Status: Full code  Family Communication: None at bedside  Disposition Plan:  Patient is from:                        home Anticipated DC to:                   family members home Anticipated DC date:                2-3 days Anticipated DC barriers:           Inpatient management is needed due to UTI and pending urology procedure  Consults called: Urology  Admission status: Observation    Bernadette Hoit MD [facility]  08/07/2020, 6:54 AM

## 2020-08-06 NOTE — ED Provider Notes (Signed)
Emergency Medicine Provider Triage Evaluation Note  Cristian Henderson , a 75 y.o. male  was evaluated in triage.  Pt complains of fever and concern of urinary tract infection.  Patient was in the emergency room April 25 and was treated for urinary retention.  Patient has noticed increasing discomfort over the last day with pain in his urethra area.  He also had a fever up to 102 at home today.  Patient is scheduled for cystoscopy and ureteral stent placement tomorrow.  Patient denies any cough but has had some mild shortness of breath  Review of Systems  Positive: Urinary discomfort, fever Negative: Abdominal pain  Physical Exam  BP 129/70 (BP Location: Right Arm)   Pulse 79   Temp 99.1 F (37.3 C) (Oral)   Resp 18   Ht 1.651 m (5\' 5" )   Wt 74.8 kg   SpO2 97%   BMI 27.46 kg/m  Gen:   Awake, no distress   Resp:  Normal effort  MSK:   Moves extremities without difficulty  Other:    Medical Decision Making  Medically screening exam initiated at 6:54 PM.  Appropriate orders placed.  Cristian Henderson was informed that the remainder of the evaluation will be completed by another provider, this initial triage assessment does not replace that evaluation, and the importance of remaining in the ED until their evaluation is complete.  Patient presents with concerns of fever and possible UTI.  Afebrile currently in the ED but will proceed with laboratory testing as well as urinalysis.  Will check a chest x-ray to evaluate for pneumonia   Dorie Rank, MD 08/06/20 [phone]

## 2020-08-06 NOTE — ED Provider Notes (Signed)
I provided a substantive portion of the care of this patient.  I personally performed the entirety of the history and exam for this encounter.     Patient initially seen by me in triage.  Patient then seen by PA Sophia.  Patient does have history of kidney transplant.  He had a fever today of 102.  Patient was concerned he was developing UTI.  Patient's laboratory tests are consistent with urinary tract infection.  He also has a leukocytosis.  No findings to suggest sepsis.  Patient is scheduled for a cystoscopy with retrograde pyelogram and ureteral stent placement tomorrow.  With his fever and UTI this point I think he would benefit with overnight observation.  I have initiated IV antibiotics.  I will consult with urology in the medical service  Discussed with Dr Diona Fanti.  He will notify Dr Haywood Lasso, MD 08/06/20 [phone]

## 2020-08-06 NOTE — Telephone Encounter (Signed)
The patient has been notified of the result and verbalized understanding.  All questions (if any) were answered. Antonieta Iba, RN 08/06/2020 9:10 AM

## 2020-08-06 NOTE — ED Triage Notes (Signed)
Pt to er, pt states that he was here nine days ago and he was given a catheter, states that he thinks that might be infected, states that earlier today he had a fever of 102.69f

## 2020-08-06 NOTE — Progress Notes (Unsigned)
Spoke to patient about results of cardiac monitoring and advised we were putting a referral in for electrophysiology.  Patient in agreement.

## 2020-08-06 NOTE — Telephone Encounter (Signed)
-----   Message from Josue Hector, MD sent at 08/04/2020  2:55 PM EDT ----- EF normal and LV size normal Mild/moderate AR f/u echo in a year

## 2020-08-06 NOTE — ED Notes (Signed)
Wife is at bedside. Patient states that his left foot hurts from gout. Pain is a 7 out of 10. Chronic issues

## 2020-08-07 ENCOUNTER — Other Ambulatory Visit: Payer: Self-pay

## 2020-08-07 ENCOUNTER — Observation Stay (HOSPITAL_COMMUNITY): Payer: Medicare Other

## 2020-08-07 ENCOUNTER — Ambulatory Visit (HOSPITAL_COMMUNITY): Admission: RE | Admit: 2020-08-07 | Payer: Medicare Other | Source: Home / Self Care | Admitting: Urology

## 2020-08-07 ENCOUNTER — Observation Stay (HOSPITAL_COMMUNITY): Payer: Medicare Other | Admitting: Anesthesiology

## 2020-08-07 ENCOUNTER — Encounter (HOSPITAL_COMMUNITY)
Admission: EM | Disposition: A | Discharge status: Home / Self Care | Payer: Self-pay | Source: Home / Self Care | Attending: Internal Medicine

## 2020-08-07 ENCOUNTER — Encounter (HOSPITAL_COMMUNITY): Payer: Self-pay | Admitting: Internal Medicine

## 2020-08-07 DIAGNOSIS — R509 Fever, unspecified: Secondary | ICD-10-CM

## 2020-08-07 DIAGNOSIS — E871 Hypo-osmolality and hyponatremia: Secondary | ICD-10-CM

## 2020-08-07 DIAGNOSIS — N131 Hydronephrosis with ureteral stricture, not elsewhere classified: Secondary | ICD-10-CM

## 2020-08-07 DIAGNOSIS — N183 Chronic kidney disease, stage 3 unspecified: Secondary | ICD-10-CM

## 2020-08-07 DIAGNOSIS — I482 Chronic atrial fibrillation, unspecified: Secondary | ICD-10-CM | POA: Diagnosis not present

## 2020-08-07 DIAGNOSIS — N3000 Acute cystitis without hematuria: Secondary | ICD-10-CM | POA: Diagnosis not present

## 2020-08-07 DIAGNOSIS — Z940 Kidney transplant status: Secondary | ICD-10-CM

## 2020-08-07 DIAGNOSIS — D72829 Elevated white blood cell count, unspecified: Secondary | ICD-10-CM

## 2020-08-07 DIAGNOSIS — N1832 Chronic kidney disease, stage 3b: Secondary | ICD-10-CM | POA: Diagnosis not present

## 2020-08-07 DIAGNOSIS — Z9489 Other transplanted organ and tissue status: Secondary | ICD-10-CM

## 2020-08-07 DIAGNOSIS — N1330 Unspecified hydronephrosis: Secondary | ICD-10-CM

## 2020-08-07 HISTORY — PX: CYSTOSCOPY: SHX5120

## 2020-08-07 HISTORY — DX: Hydronephrosis with ureteral stricture, not elsewhere classified: N13.1

## 2020-08-07 LAB — CBC
HCT: 31.9 % — ABNORMAL LOW (ref 39.0–52.0)
Hemoglobin: 10.7 g/dL — ABNORMAL LOW (ref 13.0–17.0)
MCH: 30.7 pg (ref 26.0–34.0)
MCHC: 33.5 g/dL (ref 30.0–36.0)
MCV: 91.7 fL (ref 80.0–100.0)
Platelets: 196 10*3/uL (ref 150–400)
RBC: 3.48 MIL/uL — ABNORMAL LOW (ref 4.22–5.81)
RDW: 15.4 % (ref 11.5–15.5)
WBC: 12.5 10*3/uL — ABNORMAL HIGH (ref 4.0–10.5)
nRBC: 0 % (ref 0.0–0.2)

## 2020-08-07 LAB — COMPREHENSIVE METABOLIC PANEL
ALT: 10 U/L (ref 0–44)
AST: 17 U/L (ref 15–41)
Albumin: 2.9 g/dL — ABNORMAL LOW (ref 3.5–5.0)
Alkaline Phosphatase: 61 U/L (ref 38–126)
Anion gap: 7 (ref 5–15)
BUN: 25 mg/dL — ABNORMAL HIGH (ref 8–23)
CO2: 24 mmol/L (ref 22–32)
Calcium: 8.3 mg/dL — ABNORMAL LOW (ref 8.9–10.3)
Chloride: 103 mmol/L (ref 98–111)
Creatinine, Ser: 1.52 mg/dL — ABNORMAL HIGH (ref 0.61–1.24)
GFR, Estimated: 47 mL/min — ABNORMAL LOW (ref >60)
Glucose, Bld: 220 mg/dL — ABNORMAL HIGH (ref 70–99)
Potassium: 4.3 mmol/L (ref 3.5–5.1)
Sodium: 134 mmol/L — ABNORMAL LOW (ref 135–145)
Total Bilirubin: 0.6 mg/dL (ref 0.3–1.2)
Total Protein: 6 g/dL — ABNORMAL LOW (ref 6.5–8.1)

## 2020-08-07 LAB — PROTIME-INR
INR: 1.8 — ABNORMAL HIGH (ref 0.8–1.2)
Prothrombin Time: 20.5 seconds — ABNORMAL HIGH (ref 11.4–15.2)

## 2020-08-07 LAB — MAGNESIUM: Magnesium: 1.6 mg/dL — ABNORMAL LOW (ref 1.7–2.4)

## 2020-08-07 LAB — APTT: aPTT: 38 seconds — ABNORMAL HIGH (ref 24–36)

## 2020-08-07 LAB — PHOSPHORUS: Phosphorus: 2.6 mg/dL (ref 2.5–4.6)

## 2020-08-07 SURGERY — CYSTOSCOPY
Anesthesia: General

## 2020-08-07 MED ORDER — ALLOPURINOL 100 MG PO TABS
50.0000 mg | ORAL_TABLET | Freq: Every day | ORAL | Status: DC
  Administered 2020-08-08 – 2020-08-09 (×2): 50 mg via ORAL
  Filled 2020-08-07 (×2): qty 1

## 2020-08-07 MED ORDER — ACETAMINOPHEN 650 MG RE SUPP: 650.0000 mg | Freq: Four times a day (QID) | RECTAL | Status: DC | PRN

## 2020-08-07 MED ORDER — FENTANYL CITRATE (PF) 100 MCG/2ML IJ SOLN: 25.0000 ug | INTRAMUSCULAR | Status: DC | PRN

## 2020-08-07 MED ORDER — CYCLOSPORINE MODIFIED (NEORAL) 25 MG PO CAPS
75.0000 mg | ORAL_CAPSULE | Freq: Two times a day (BID) | ORAL | Status: DC
  Administered 2020-08-08 – 2020-08-09 (×3): 75 mg via ORAL
  Filled 2020-08-07 (×8): qty 3

## 2020-08-07 MED ORDER — DILTIAZEM HCL ER COATED BEADS 240 MG PO CP24
240.0000 mg | ORAL_CAPSULE | Freq: Every day | ORAL | Status: DC
  Administered 2020-08-07 – 2020-08-09 (×3): 240 mg via ORAL
  Filled 2020-08-07 (×3): qty 1

## 2020-08-07 MED ORDER — SODIUM CHLORIDE 0.9 % IV SOLN
1.0000 g | INTRAVENOUS | Status: DC
  Administered 2020-08-07 – 2020-08-09 (×2): 1 g via INTRAVENOUS
  Filled 2020-08-07 (×2): qty 10

## 2020-08-07 MED ORDER — LIDOCAINE HCL (CARDIAC) PF 100 MG/5ML IV SOSY
PREFILLED_SYRINGE | INTRAVENOUS | Status: DC | PRN
  Administered 2020-08-07: 60 mg via INTRAVENOUS

## 2020-08-07 MED ORDER — SODIUM CHLORIDE 0.9 % IR SOLN
Status: DC | PRN
  Administered 2020-08-07 (×2): 3000 mL via INTRAVESICAL

## 2020-08-07 MED ORDER — FENTANYL CITRATE (PF) 250 MCG/5ML IJ SOLN
INTRAMUSCULAR | Status: DC | PRN
  Administered 2020-08-07 (×4): 25 ug via INTRAVENOUS

## 2020-08-07 MED ORDER — MAGNESIUM SULFATE 2 GM/50ML IV SOLN
2.0000 g | Freq: Once | INTRAVENOUS | Status: AC
  Administered 2020-08-07: 2 g via INTRAVENOUS
  Filled 2020-08-07 (×2): qty 50

## 2020-08-07 MED ORDER — CHLORHEXIDINE GLUCONATE 0.12 % MT SOLN: 15.0000 mL | Freq: Once | OROMUCOSAL | Status: DC

## 2020-08-07 MED ORDER — LACTATED RINGERS IV SOLN: INTRAVENOUS | Status: DC

## 2020-08-07 MED ORDER — CHLORHEXIDINE GLUCONATE CLOTH 2 % EX PADS
6.0000 | MEDICATED_PAD | Freq: Every day | CUTANEOUS | Status: DC
  Administered 2020-08-08: 6 via TOPICAL

## 2020-08-07 MED ORDER — VITAMIN D 25 MCG (1000 UNIT) PO TABS
1000.0000 [IU] | ORAL_TABLET | Freq: Every day | ORAL | Status: DC
  Administered 2020-08-08 – 2020-08-09 (×2): 1000 [IU] via ORAL
  Filled 2020-08-07 (×2): qty 1

## 2020-08-07 MED ORDER — TAMSULOSIN HCL 0.4 MG PO CAPS
0.4000 mg | ORAL_CAPSULE | Freq: Every day | ORAL | Status: DC
  Administered 2020-08-07 – 2020-08-09 (×3): 0.4 mg via ORAL
  Filled 2020-08-07 (×3): qty 1

## 2020-08-07 MED ORDER — HYDRALAZINE HCL 25 MG PO TABS
25.0000 mg | ORAL_TABLET | Freq: Three times a day (TID) | ORAL | Status: DC
  Administered 2020-08-07 – 2020-08-09 (×4): 25 mg via ORAL
  Filled 2020-08-07 (×5): qty 1

## 2020-08-07 MED ORDER — SODIUM BICARBONATE 650 MG PO TABS
650.0000 mg | ORAL_TABLET | Freq: Two times a day (BID) | ORAL | Status: DC
  Administered 2020-08-07 – 2020-08-09 (×4): 650 mg via ORAL
  Filled 2020-08-07 (×4): qty 1

## 2020-08-07 MED ORDER — EPHEDRINE SULFATE 50 MG/ML IJ SOLN
INTRAMUSCULAR | Status: DC | PRN
  Administered 2020-08-07 (×2): 5 mg via INTRAVENOUS

## 2020-08-07 MED ORDER — DEXAMETHASONE SODIUM PHOSPHATE 10 MG/ML IJ SOLN
INTRAMUSCULAR | Status: DC | PRN
  Administered 2020-08-07: 5 mg via INTRAVENOUS

## 2020-08-07 MED ORDER — PANTOPRAZOLE SODIUM 40 MG IV SOLR
40.0000 mg | INTRAVENOUS | Status: DC
  Administered 2020-08-07 – 2020-08-08 (×2): 40 mg via INTRAVENOUS
  Filled 2020-08-07 (×2): qty 40

## 2020-08-07 MED ORDER — ACETAMINOPHEN 325 MG PO TABS: 650.0000 mg | ORAL_TABLET | Freq: Four times a day (QID) | ORAL | Status: DC | PRN

## 2020-08-07 MED ORDER — ESCITALOPRAM OXALATE 10 MG PO TABS
10.0000 mg | ORAL_TABLET | Freq: Every day | ORAL | Status: DC
  Administered 2020-08-08 – 2020-08-09 (×2): 10 mg via ORAL
  Filled 2020-08-07 (×2): qty 1

## 2020-08-07 MED ORDER — PROPOFOL 10 MG/ML IV BOLUS
INTRAVENOUS | Status: DC | PRN
  Administered 2020-08-07: 150 mg via INTRAVENOUS
  Administered 2020-08-07: 30 mg via INTRAVENOUS

## 2020-08-07 MED ORDER — ONDANSETRON HCL 4 MG/2ML IJ SOLN
INTRAMUSCULAR | Status: DC | PRN
  Administered 2020-08-07: 4 mg via INTRAVENOUS

## 2020-08-07 MED ORDER — ALPRAZOLAM 0.25 MG PO TABS: 0.2500 mg | ORAL_TABLET | Freq: Every day | ORAL | Status: DC | PRN

## 2020-08-07 MED ORDER — EZETIMIBE 10 MG PO TABS
10.0000 mg | ORAL_TABLET | Freq: Every day | ORAL | Status: DC
  Administered 2020-08-08 – 2020-08-09 (×2): 10 mg via ORAL
  Filled 2020-08-07 (×2): qty 1

## 2020-08-07 MED ORDER — MYCOPHENOLATE SODIUM 180 MG PO TBEC
180.0000 mg | DELAYED_RELEASE_TABLET | Freq: Two times a day (BID) | ORAL | Status: DC
  Administered 2020-08-08 – 2020-08-09 (×3): 180 mg via ORAL
  Filled 2020-08-07 (×8): qty 1

## 2020-08-07 MED ORDER — STERILE WATER FOR IRRIGATION IR SOLN
Status: DC | PRN
  Administered 2020-08-07: 500 mL

## 2020-08-07 MED ORDER — CEFAZOLIN SODIUM-DEXTROSE 2-4 GM/100ML-% IV SOLN: 2.0000 g | INTRAVENOUS | Status: DC

## 2020-08-07 MED ORDER — ONDANSETRON HCL 4 MG/2ML IJ SOLN
4.0000 mg | Freq: Once | INTRAMUSCULAR | Status: DC | PRN
Start: 2020-08-07 — End: 2020-08-07

## 2020-08-07 MED ORDER — ORAL CARE MOUTH RINSE: 15.0000 mL | Freq: Once | OROMUCOSAL | Status: DC

## 2020-08-07 SURGICAL SUPPLY — 22 items
BAG DRAIN URO TABLE W/ADPT NS (BAG) ×3 IMPLANT
BAG HAMPER (MISCELLANEOUS) ×3 IMPLANT
BAG URINE DRAIN 2000ML AR STRL (UROLOGICAL SUPPLIES) ×3 IMPLANT
CATH FOLEY 2WAY SLVR  5CC 18FR (CATHETERS) ×1
CATH FOLEY 2WAY SLVR 5CC 18FR (CATHETERS) ×1 IMPLANT
CATH UROLOGY TORQUE 65 (CATHETERS) ×2 IMPLANT
CLOTH BEACON ORANGE TIMEOUT ST (SAFETY) ×3 IMPLANT
DECANTER SPIKE VIAL GLASS SM (MISCELLANEOUS) ×3 IMPLANT
GLOVE BIO SURGEON STRL SZ8 (GLOVE) ×3 IMPLANT
GLOVE SURG UNDER POLY LF SZ7 (GLOVE) ×6 IMPLANT
GOWN STRL REUS W/TWL LRG LVL3 (GOWN DISPOSABLE) ×3 IMPLANT
GOWN STRL REUS W/TWL XL LVL3 (GOWN DISPOSABLE) ×3 IMPLANT
GUIDEWIRE STR DUAL SENSOR (WIRE) ×3 IMPLANT
GUIDEWIRE STR ZIPWIRE 035X150 (MISCELLANEOUS) ×3 IMPLANT
KIT TURNOVER CYSTO (KITS) ×3 IMPLANT
MANIFOLD NEPTUNE II (INSTRUMENTS) ×3 IMPLANT
PACK CYSTO (CUSTOM PROCEDURE TRAY) ×3 IMPLANT
PAD ARMBOARD 7.5X6 YLW CONV (MISCELLANEOUS) ×3 IMPLANT
SYR 10ML LL (SYRINGE) ×3 IMPLANT
TOWEL NATURAL 4PK STERILE (DISPOSABLE) ×3 IMPLANT
WATER STERILE IRR 3000ML UROMA (IV SOLUTION) ×5 IMPLANT
WATER STERILE IRR 500ML POUR (IV SOLUTION) ×3 IMPLANT

## 2020-08-07 NOTE — Interval H&P Note (Signed)
History and Physical Interval Note:  08/07/2020 8:56 AM  Cristian Henderson  has presented today for surgery, with the diagnosis of hydronephrosis transplant kidney.  The various methods of treatment have been discussed with the patient and family. After consideration of risks, benefits and other options for treatment, the patient has consented to  Procedure(s): CYSTOSCOPY WITH RETROGRADE PYELOGRAM/URETERAL STENT PLACEMENT (Right) BALLOON DILATION- ureteral stricture (Right) CYSTOSCOPY WITH URETHRAL DILATATION (Right) as a surgical intervention.  The patient's history has been reviewed, patient examined, no change in status, stable for surgery.  I have reviewed the patient's chart and labs.  Questions were answered to the patient's satisfaction.     Nicolette Bang

## 2020-08-07 NOTE — Op Note (Signed)
.  Preoperative diagnosis: transplant hydronephrosis  Postoperative diagnosis: Same  Procedure: 1 cystoscopy  Attending: Nicolette Bang  Anesthesia: General  Estimated blood loss: None  Drains:16 french foley catheter  Specimens: none  Antibiotics: rocephin   Findings: No masses/lesions in the bladder. Transplant ureter in right dome. Unable to cannulate ureteral orifice and unable to pass a wire.  Indications: Patient is a 75 year old male with a history of hydronephrosis in his right pelvic transplant kidney.  After discussing treatment options, they decided proceed with transplant stent placement.  Procedure in detail: The patient was brought to the operating room and a brief timeout was done to ensure correct patient, correct procedure, correct site.  General anesthesia was administered patient was placed in dorsal lithotomy position.  Their genitalia was then prepped and draped in usual sterile fashion.  A rigid 74 French cystoscope was passed in the urethra and the bladder.  Bladder was inspected free masses or lesions.  We identified the transplant ureteral orifice in the right dome. We attempted to cannulate the orifice with a ureteral catheter, a zipwire and a sensor wire which was unsuccessful. A foley catheter was then placed. the bladder was then drained and this concluded the procedure which was well tolerated by patient.  Complications: None  Condition: Stable, extubated, transferred to PACU  Plan: Patient is to be admitted for IV antibiotics due to concern for UTI. He will be scheduled for nephrectomy tube placement in his transplanted kidney.

## 2020-08-07 NOTE — Progress Notes (Signed)
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  Patient initially was scheduled for Foley catheter placement and cystoscopy and retrograde pyelogram and ureteral stent placement however because he complained of a 2-day onset of burning and discomfort in his bladder as well as a fever he presented to the ED for further evaluation.  Work-up in the ED showed that he had a leukocytosis and his BUN/creatinine was 20/1.76.  Urology was consulted and started on IV antibiotics and he was taken for cystoscopy today and attempted ureteral stent placement in the transplanted kidney however this was unsuccessful.  Plan is interventional radiology to place a nephrostomy tube now and they are obtaining a renal ultrasound first.  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   Atrial fibrillation, chronic (HCC)  UTI, poA with Sepsis ruled out -Patient placed in Med-Surge Observation  -Did NOT meet Sepsis Criteria and only had a Leukocytosis on Admission of 13.9 and now improved to 12.5; T Max was 99.1 but he stated prior to Admit his Temperature was 102 -LA was 1.3 and went to 0.9 -U/A done and showed a Hazy Appearance, with Large Hgb, Moderate Leukocytes, Positive Nitrites, Many Bacteria, >50 RBC/HPF, >50 WBC -Patient has  Irritative Bladder Symptoms -He was started on IV Ceftriaxone which we will continue  -Given Acetaminophen for fevers -Follow urinary culture results -Patient to have a nephrostomy tube placed in his transplanted kidney by IR; Plan is to go to [facility] tomorrow and come back after -Renal ultrasound is being obtained  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 -Paitent's BUN/Cr went from 28/1.76 -> 25/1.52 -C/w Cyclosporine 75 mg po BID and with Mycophenolate 180 mg BID -Resume Sodium Bicarbonate 650 mg po BID -Currently holding his Lasix 40 mg po Daily   Hypomagnesemia -Paitent's Mag Level was 1.6 -Replete with IV Mag Sulfate 2 grams -Continue to Monitor and Replete as Necessary -Repeat Mag Level in the AM   Paroxysmal Atrial Fibrillation -Resume Dilitazem 240 mg Daily when tolerating po -Holding Anticoagulation with Rivaroxaban but will resume at Urology direction  Acquired Thrombophilia -Patient has a Hypercoaguable state 2/2 to PAF -Resume Rivaroxaban once stable from a Urological Perspective   Anxiety and Depression/PTSD  -C/w Alprazolam 0.25 mg po Dailyprn and with Escitalopram 10 mg po Daily   Hydronephrosis with Ureteral Stricture Benign Prostatic Hyperplasia -Patient was supposed to have a Cystoscopy with Retrograde Pyelogram and Ureteral Stent placed but because of Urinay Symptoms and Fever for 2 days he presented to the ED early -Urology consulted and patient to go for Urological Procedure today; Dr. Noah Delaine the patient to the OR and do a cystoscopy and identify the transplant ureteral orifice in the right low.  He attempted to cannulate the orifice with a ureteral catheter and reservoir and a sensor wire which was unsuccessful.  Foley catheter was  placed and he was admitted for IV antibiotics and taken back after he was extubated and is going to be scheduled for a nephrectomy tube placement and his transplant kidney -Resume Tamsulosin 0.4 mg po Daily  -Renal U/S being ordered    Hyponatremia -Mild and Improving. Na+ has gone from 131 -> 134 -C/w LR at 50 mL/hr for now  -Continue to Monitor and Trend and Repeat CMP in the AM  Normocytic Anemia/Anemia of Chronic Kidney Disease -Patient's Hgb/Hct went from 11.7/36.5 -> 10.7/31.9 -Check Anemia Panel in the AM  -Continue to Monitor for S/Sx of Bleeding; His UA did have Hgb in it with >50 RBC/HPF -Repeat CBC in the AM   Gout -C/w Allopurinol 50 mg po Daily   GERD -C/w with IV PPI Pantoprazole 40 mg q24h and resume po 40 mg po Daily when Tolerating Diet   Essential HTN -C/w Diltiazem 240 mg po Daily  -Resume Hydralazine 25 mg po TID as well when out of Urological Procedure  -Currently holding Lasix 40 mg po Daily  -Continue to Monitor BP per Protocol -Last BP was  HLD -C/w with Ezetimibe 10 mg po Daily   DVT prophylaxis: His Rivaroxaban has currently been held Code Status: FULL CODE Family Communication: Discussed with wife at bedside Disposition Plan: Patient is to go for IR Nephrostomy Tube Placement at [place]   Status is: Observation  The patient will require care spanning > 2 midnights and should be moved to inpatient because: Unsafe d/c plan, IV treatments appropriate due to intensity of illness or inability to take PO and Inpatient level of care appropriate due to severity of illness  Dispo: The patient is from: Home              Anticipated d/c is to: TBD              Patient currently is not medically stable to d/c.   Difficult to place patient No  Consultants:   Urology   Procedures: Cystoscopy as Stent placement Unsuccessful  Antimicrobials:  Anti-infectives (From admission, onward)   Start     Dose/Rate Route Frequency Ordered Stop   08/07/20 0700  [MAR Hold]  cefTRIAXone (ROCEPHIN) 1 g in sodium chloride 0.9 % 100 mL IVPB        (MAR Hold since Thu 08/07/2020 at [place].Hold Reason: Transfer to a Procedural area.)   1 g 200 mL/hr over 30 Minutes Intravenous Every 24 hours  08/07/20 0658     08/06/20 2230  cefTRIAXone (ROCEPHIN) 2 g in sodium chloride 0.9 % 100 mL IVPB        2 g 200 mL/hr over 30 Minutes Intravenous  Once 08/06/20 2223 08/06/20 2316        Subjective: Seen and examined at bedside he denied any nausea or vomiting.  Denied abdominal pain or complaints.  He thinks his urinary symptoms are improved a little bit.  No chest pain or shortness breath.  Case was discussed with urology and patient had an unsuccessful cannulation of his ureteral orifice in the right dome of his transplant kidney so he will get a nephrostomy tube in the morning.  No other concerns or plans at this time.  Objective: Vitals:   08/07/20 0630 08/07/20 0700 08/07/20 0730 08/07/20 0800  BP: (!) 142/72 (!) 147/66 (!) 146/65 (!) 149/75  Pulse: 83 80 72 81  Resp: 17     Temp:      TempSrc:      SpO2: 97%  97% 93% 96%  Weight:      Height:        Intake/Output Summary (Last 24 hours) at 08/07/2020 1884 Last data filed at 08/07/2020 1660 Gross per 24 hour  Intake 97.94 ml  Output 75 ml  Net 22.94 ml   Filed Weights   08/06/20 1838  Weight: 74.8 kg   Examination: Physical Exam:  Constitutional: WN/WD overweight Caucasian male currently in NAD and appears calm and comfortable Eyes: Lids and conjunctivae normal, sclerae anicteric  ENMT: External Ears, Nose appear normal. Grossly normal hearing.  Neck: Appears normal, supple, no cervical masses, normal ROM, no appreciable thyromegaly; no JVD Respiratory: Diminished to auscultation bilaterally, no wheezing, rales, rhonchi or crackles. Normal respiratory effort and patient is not tachypenic. No accessory muscle use.  Unlabored breathing Cardiovascular: RRR, no murmurs / rubs / gallops. S1 and S2 auscultated.  No appreciable extremity edema Abdomen: Soft, non-tender, slightly distended secondary body habitus. Bowel sounds positive.  GU: Deferred.  Foley catheter in place draining red-colored urine Musculoskeletal: No  clubbing / cyanosis of digits/nails. No joint deformity upper and lower extremities.  Skin: No rashes, lesions, ulcers or limited skin evaluation. No induration; Warm and dry.  Neurologic: CN 2-12 grossly intact with no focal deficits. Romberg sign and cerebellar reflexes not assessed.  Psychiatric: Normal judgment and insight. Alert and oriented x 3. Normal mood and appropriate affect.   Data Reviewed: I have personally reviewed following labs and imaging studies  CBC: Recent Labs  Lab 08/06/20 1925 08/07/20 0340  WBC 13.9* 12.5*  NEUTROABS 10.8*  --   HGB 11.7* 10.7*  HCT 36.5* 31.9*  MCV 94.1 91.7  PLT 208 630   Basic Metabolic Panel: Recent Labs  Lab 08/06/20 1925 08/07/20 0340  NA 131* 134*  K 4.3 4.3  CL 100 103  CO2 23 24  GLUCOSE 129* 220*  BUN 28* 25*  CREATININE 1.76* 1.52*  CALCIUM 8.9 8.3*  MG  --  1.6*  PHOS  --  2.6   GFR: Estimated Creatinine Clearance: 39.7 mL/min (A) (by C-G formula based on SCr of 1.52 mg/dL (H)). Liver Function Tests: Recent Labs  Lab 08/06/20 1925 08/07/20 0340  AST 11* 17  ALT 12 10  ALKPHOS 71 61  BILITOT 0.7 0.6  PROT 7.0 6.0*  ALBUMIN 3.6 2.9*   No results for input(s): LIPASE, AMYLASE in the last 168 hours. No results for input(s): AMMONIA in the last 168 hours. Coagulation Profile: Recent Labs  Lab 08/07/20 0340  INR 1.8*   Cardiac Enzymes: No results for input(s): CKTOTAL, CKMB, CKMBINDEX, TROPONINI in the last 168 hours. BNP (last 3 results) No results for input(s): PROBNP in the last 8760 hours. HbA1C: No results for input(s): HGBA1C in the last 72 hours. CBG: No results for input(s): GLUCAP in the last 168 hours. Lipid Profile: No results for input(s): CHOL, HDL, LDLCALC, TRIG, CHOLHDL, LDLDIRECT in the last 72 hours. Thyroid Function Tests: No results for input(s): TSH, T4TOTAL, FREET4, T3FREE, THYROIDAB in the last 72 hours. Anemia Panel: No results for input(s): VITAMINB12, FOLATE, FERRITIN, TIBC,  IRON, RETICCTPCT in the last 72 hours. Sepsis Labs: Recent Labs  Lab 08/06/20 1925 08/06/20 2130  LATICACIDVEN 1.3 0.9    Recent Results (from the past 240 hour(s))  Urine culture     Status: None   Collection Time: 07/28/20  2:29 PM   Specimen: Urine, Clean Catch  Result Value Ref Range Status   Specimen Description   Final  URINE, CLEAN CATCH Performed at [facility], [address], [place], [place] [address]    Special Requests   Final    NONE Performed at [facility], [address], [place], [place] [address]    Culture   Final    NO GROWTH Performed at [facility], [place] [address] [place], [place] [address]    Report Status 07/30/2020 FINAL  Final  Resp Panel by RT-PCR (Flu A&B, Covid) Nasopharyngeal Swab     Status: None   Collection Time: 07/28/20  2:30 PM   Specimen: Nasopharyngeal Swab; Nasopharyngeal(NP) swabs in vial transport medium  Result Value Ref Range Status   SARS Coronavirus 2 by RT PCR NEGATIVE NEGATIVE Final    Comment: (NOTE) SARS-CoV-2 target nucleic acids are NOT DETECTED.  The SARS-CoV-2 RNA is generally detectable in upper respiratory specimens during the acute phase of infection. The lowest concentration of SARS-CoV-2 viral copies this assay can detect is 138 copies/mL. A negative result does not preclude SARS-Cov-2 infection and should not be used as the sole basis for treatment or other patient management decisions. A negative result may occur with  improper specimen collection/handling, submission of specimen other than nasopharyngeal swab, presence of viral mutation(s) within the areas targeted by this assay, and inadequate number of viral copies(<138 copies/mL). A negative result must be combined with clinical observations, patient history, and epidemiological information. The expected result is Negative.  Fact Sheet for Patients:  EntrepreneurPulse.com.au  Fact Sheet for Healthcare Providers:   IncredibleEmployment.be  This test is no t yet approved or cleared by the [place] FDA and  has been authorized for detection and/or diagnosis of SARS-CoV-2 by FDA under an Emergency Use Authorization (EUA). This EUA will remain  in effect (meaning this test can be used) for the duration of the COVID-19 declaration under Section 564(b)(1) of the Act, 21 U.S.C.section 360bbb-3(b)(1), unless the authorization is terminated  or revoked sooner.       Influenza A by PCR NEGATIVE NEGATIVE Final   Influenza B by PCR NEGATIVE NEGATIVE Final    Comment: (NOTE) The Xpert Xpress SARS-CoV-2/FLU/RSV plus assay is intended as an aid in the diagnosis of influenza from Nasopharyngeal swab specimens and should not be used as a sole basis for treatment. Nasal washings and aspirates are unacceptable for Xpert Xpress SARS-CoV-2/FLU/RSV testing.  Fact Sheet for Patients: EntrepreneurPulse.com.au  Fact Sheet for Healthcare Providers: IncredibleEmployment.be  This test is not yet approved or cleared by the [place] FDA and has been authorized for detection and/or diagnosis of SARS-CoV-2 by FDA under an Emergency Use Authorization (EUA). This EUA will remain in effect (meaning this test can be used) for the duration of the COVID-19 declaration under Section 564(b)(1) of the Act, 21 U.S.C. section 360bbb-3(b)(1), unless the authorization is terminated or revoked.  Performed at [facility], [address] [place], [place] [address]   SARS CORONAVIRUS 2 (TAT 6-24 HRS) Nasopharyngeal Nasopharyngeal Swab     Status: None   Collection Time: 08/05/20  9:26 AM   Specimen: Nasopharyngeal Swab  Result Value Ref Range Status   SARS Coronavirus 2 NEGATIVE NEGATIVE Final    Comment: (NOTE) SARS-CoV-2 target nucleic acids are NOT DETECTED.  The SARS-CoV-2 RNA is generally detectable in upper and lower respiratory specimens during the acute  phase of infection. Negative results do not preclude SARS-CoV-2 infection, do not rule out co-infections with other pathogens, and should not be used as the sole basis for treatment or other patient management  decisions. Negative results must be combined with clinical observations, patient history, and epidemiological information. The expected result is Negative.  Fact Sheet for Patients: SugarRoll.be  Fact Sheet for Healthcare Providers: https://www.woods-mathews.com/  This test is not yet approved or cleared by the [place] FDA and  has been authorized for detection and/or diagnosis of SARS-CoV-2 by FDA under an Emergency Use Authorization (EUA). This EUA will remain  in effect (meaning this test can be used) for the duration of the COVID-19 declaration under Se ction 564(b)(1) of the Act, 21 U.S.C. section 360bbb-3(b)(1), unless the authorization is terminated or revoked sooner.  Performed at [facility], [place] [address], [place], [place] [address]     RN Pressure Injury Documentation:     Estimated body mass index is 27.46 kg/m as calculated from the following:   Height as of this encounter: 5\' 5"  (1.651 m).   Weight as of this encounter: 74.8 kg.  Malnutrition Type:   Malnutrition Characteristics:   Nutrition Interventions:     Radiology Studies: DG Chest 2 View  Result Date: 08/06/2020 CLINICAL DATA:  Fever EXAM: CHEST - 2 VIEW COMPARISON:  06/29/2020 FINDINGS: Lungs are well expanded, symmetric, and clear. No pneumothorax or pleural effusion. Cardiac size within normal limits. Pulmonary vascularity is normal. Osseous structures are age-appropriate. Left total shoulder arthroplasty has been performed. No acute bone abnormality. IMPRESSION: No active cardiopulmonary disease. Electronically Signed   By: Fidela Salisbury MD   On: 08/06/2020 19:33   CARDIAC EVENT MONITOR  Result Date: 08/06/2020 Frequent bouts of rapid  PAF/Flutter/Fib that Correlate with symptoms of dyspnea and palpitations Refer to EP for further Rx  Scheduled Meds: . [MAR Hold] pantoprazole (PROTONIX) IV  40 mg Intravenous Q24H   Continuous Infusions: . [MAR Hold] cefTRIAXone (ROCEPHIN)  IV Stopped (08/07/20 8453)    LOS: 0 days   Kerney Elbe, DO [facility] PAGER is on [place]  If 7PM-7AM, please contact night-coverage [email or web address]

## 2020-08-07 NOTE — ED Notes (Signed)
Daysurgery called and pt is now going to surgery.

## 2020-08-07 NOTE — Progress Notes (Signed)
Telephone call received from Wife Deborah Dondero at [phone] requesting consideration for patient to be transferred. Advised wife that Dr. Alyson Ingles was in surgery and would give him the message to call patients wife.

## 2020-08-07 NOTE — Progress Notes (Signed)
Interventional Radiology Brief Note:  Mr. Cristian Henderson is a 75 year old gentleman with a history of kidney transplant who presented to [facility] ED with burning with urination and fever.  He was found to have hydronephrosis with ureteral stricture. He has been following with Dr. Alyson Ingles for this issue. Urology attempted ureteral stent placement today, however could not pass the ureteral orifice.  IR was consulted for nephrostomy tube placement.  US Renal was requested by Dr. Vernard Gambles prior to proceed with tube placement.  This shows improvement in the hydronephrosis. Patient did have slight improvement in his SCr from 1.76 to 1.52.   Case reviewed by Dr. Vernard Gambles who feels procedure is technically feasible and patient can be scheduled for tube placement if there is a strong indication to do so.  Patient case also reviewed by potential performing IR MDs Dr. Kathlene Cote and Dr. Serafina Royals who note concern for risks with procedure in transplanted kidney especially given the improvement in hydronephrosis and Cr since admission.  Plan to hold on procedure tomorrow. Discussed with Dr. Alfredia Ferguson who plans to discuss further with Dr. Alyson Ingles vs. Transfer patient to [facility] or [facility] for further work-up/evaluation.    IR remains available, but recommends input from transplant center ([facility]).   Brynda Greathouse, MS RD PA-C 5:18 PM

## 2020-08-07 NOTE — ED Notes (Addendum)
Cristian Henderson is allowed to get information per patient.

## 2020-08-07 NOTE — ED Notes (Signed)
Sandy aware of day surgery getting pt now for surgery.

## 2020-08-07 NOTE — Anesthesia Procedure Notes (Signed)
Procedure Name: LMA Insertion Date/Time: 08/07/2020 9:29 AM Performed by: Karna Dupes, CRNA Pre-anesthesia Checklist: Patient identified, Emergency Drugs available, Suction available and Patient being monitored Patient Re-evaluated:Patient Re-evaluated prior to induction Oxygen Delivery Method: Circle system utilized Preoxygenation: Pre-oxygenation with 100% oxygen Induction Type: IV induction LMA: LMA inserted LMA Size: 4.0 Tube type: Oral Number of attempts: 1 Placement Confirmation: positive ETCO2 Tube secured with: Tape Dental Injury: Teeth and Oropharynx as per pre-operative assessment

## 2020-08-07 NOTE — Progress Notes (Signed)
Dr. Alyson Ingles out of surgery and in dictation room. Message given to call patient wife regarding more questions and considering transfer.

## 2020-08-07 NOTE — Transfer of Care (Signed)
Immediate Anesthesia Transfer of Care Note  Patient: Cristian Henderson  Procedure(s) Performed: CYSTOSCOPY, INSERTION OF FOLEY CATHETER  Patient Location: PACU  Anesthesia Type:General  Level of Consciousness: drowsy  Airway & Oxygen Therapy: Patient Spontanous Breathing and Patient connected to nasal cannula oxygen  Post-op Assessment: Report given to RN and Post -op Vital signs reviewed and stable  Post vital signs: Reviewed and stable  Last Vitals:  Vitals Value Taken Time  BP    Temp 98   Pulse 84   Resp 14   SpO2 95     Last Pain:  Vitals:   08/07/20 0838  TempSrc: Oral  PainSc: 0-No pain         Complications: No complications documented.

## 2020-08-07 NOTE — Anesthesia Preprocedure Evaluation (Signed)
Anesthesia Evaluation  Patient identified by MRN, date of birth, ID band Patient awake    Reviewed: Allergy & Precautions, NPO status , Patient's Chart, lab work & pertinent test results  History of Anesthesia Complications Negative for: history of anesthetic complications  Airway Mallampati: II  TM Distance: >3 FB Neck ROM: Full    Dental  (+) Dental Advisory Given, Upper Dentures, Missing   Pulmonary neg pulmonary ROS,    Pulmonary exam normal breath sounds clear to auscultation       Cardiovascular Exercise Tolerance: Good hypertension, Pt. on medications Normal cardiovascular exam+ dysrhythmias Atrial Fibrillation  Rhythm:Regular Rate:Normal     Neuro/Psych PSYCHIATRIC DISORDERS Anxiety Depression negative neurological ROS     GI/Hepatic GERD  Medicated and Controlled,  Endo/Other  negative endocrine ROS  Renal/GU Renal InsufficiencyRenal disease (renal transplant)     Musculoskeletal  (+) Arthritis  (gout),   Abdominal   Peds  Hematology negative hematology ROS (+)   Anesthesia Other Findings   Reproductive/Obstetrics                             Anesthesia Physical Anesthesia Plan  ASA: III and emergent  Anesthesia Plan: General   Post-op Pain Management:    Induction: Intravenous  PONV Risk Score and Plan: 4 or greater and Ondansetron, Metaclopromide and Dexamethasone  Airway Management Planned: LMA  Additional Equipment:   Intra-op Plan:   Post-operative Plan: Extubation in OR  Informed Consent: I have reviewed the patients History and Physical, chart, labs and discussed the procedure including the risks, benefits and alternatives for the proposed anesthesia with the patient or authorized representative who has indicated his/her understanding and acceptance.     Dental advisory given  Plan Discussed with: CRNA and Surgeon  Anesthesia Plan Comments:          Anesthesia Quick Evaluation

## 2020-08-07 NOTE — Anesthesia Postprocedure Evaluation (Signed)
Anesthesia Post Note  Patient: [name]  Procedure(s) Performed: CYSTOSCOPY, INSERTION OF FOLEY CATHETER  Patient location during evaluation: PACU Anesthesia Type: General Level of consciousness: awake and alert and oriented Pain management: pain level controlled Vital Signs Assessment: post-procedure vital signs reviewed and stable Respiratory status: spontaneous breathing and respiratory function stable Cardiovascular status: blood pressure returned to baseline and stable Postop Assessment: no apparent nausea or vomiting Anesthetic complications: no   No complications documented.   Last Vitals:  Vitals:   08/07/20 1045 08/07/20 1100  BP: (!) 149/62 (!) 148/80  Pulse: 83 75  Resp: 20 (!) 23  Temp:    SpO2: 96% 94%    Last Pain:  Vitals:   08/07/20 1100  TempSrc:   PainSc: 0-No pain                 Tyson Masin C Beckam Abdulaziz

## 2020-08-08 ENCOUNTER — Encounter (HOSPITAL_COMMUNITY): Payer: Self-pay | Admitting: Urology

## 2020-08-08 DIAGNOSIS — N1832 Chronic kidney disease, stage 3b: Secondary | ICD-10-CM | POA: Diagnosis present

## 2020-08-08 DIAGNOSIS — N136 Pyonephrosis: Secondary | ICD-10-CM | POA: Diagnosis present

## 2020-08-08 DIAGNOSIS — I10 Essential (primary) hypertension: Secondary | ICD-10-CM | POA: Diagnosis not present

## 2020-08-08 DIAGNOSIS — M109 Gout, unspecified: Secondary | ICD-10-CM | POA: Diagnosis present

## 2020-08-08 DIAGNOSIS — I482 Chronic atrial fibrillation, unspecified: Secondary | ICD-10-CM | POA: Diagnosis present

## 2020-08-08 DIAGNOSIS — N3000 Acute cystitis without hematuria: Secondary | ICD-10-CM | POA: Diagnosis not present

## 2020-08-08 DIAGNOSIS — Z85828 Personal history of other malignant neoplasm of skin: Secondary | ICD-10-CM | POA: Diagnosis not present

## 2020-08-08 DIAGNOSIS — Z20822 Contact with and (suspected) exposure to covid-19: Secondary | ICD-10-CM | POA: Diagnosis present

## 2020-08-08 DIAGNOSIS — Z881 Allergy status to other antibiotic agents status: Secondary | ICD-10-CM | POA: Diagnosis not present

## 2020-08-08 DIAGNOSIS — M1990 Unspecified osteoarthritis, unspecified site: Secondary | ICD-10-CM | POA: Diagnosis present

## 2020-08-08 DIAGNOSIS — Z833 Family history of diabetes mellitus: Secondary | ICD-10-CM | POA: Diagnosis not present

## 2020-08-08 DIAGNOSIS — E785 Hyperlipidemia, unspecified: Secondary | ICD-10-CM | POA: Diagnosis present

## 2020-08-08 DIAGNOSIS — I480 Paroxysmal atrial fibrillation: Secondary | ICD-10-CM | POA: Diagnosis present

## 2020-08-08 DIAGNOSIS — Z888 Allergy status to other drugs, medicaments and biological substances status: Secondary | ICD-10-CM | POA: Diagnosis not present

## 2020-08-08 DIAGNOSIS — N390 Urinary tract infection, site not specified: Secondary | ICD-10-CM | POA: Diagnosis present

## 2020-08-08 DIAGNOSIS — Y830 Surgical operation with transplant of whole organ as the cause of abnormal reaction of the patient, or of later complication, without mention of misadventure at the time of the procedure: Secondary | ICD-10-CM | POA: Diagnosis present

## 2020-08-08 DIAGNOSIS — Z820 Family history of epilepsy and other diseases of the nervous system: Secondary | ICD-10-CM | POA: Diagnosis not present

## 2020-08-08 DIAGNOSIS — E7800 Pure hypercholesterolemia, unspecified: Secondary | ICD-10-CM | POA: Diagnosis present

## 2020-08-08 DIAGNOSIS — Z7901 Long term (current) use of anticoagulants: Secondary | ICD-10-CM | POA: Diagnosis not present

## 2020-08-08 DIAGNOSIS — N1330 Unspecified hydronephrosis: Secondary | ICD-10-CM | POA: Diagnosis not present

## 2020-08-08 DIAGNOSIS — Z79899 Other long term (current) drug therapy: Secondary | ICD-10-CM | POA: Diagnosis not present

## 2020-08-08 DIAGNOSIS — T8619 Other complication of kidney transplant: Secondary | ICD-10-CM | POA: Diagnosis present

## 2020-08-08 DIAGNOSIS — K219 Gastro-esophageal reflux disease without esophagitis: Secondary | ICD-10-CM | POA: Diagnosis present

## 2020-08-08 DIAGNOSIS — F4310 Post-traumatic stress disorder, unspecified: Secondary | ICD-10-CM | POA: Diagnosis present

## 2020-08-08 DIAGNOSIS — A498 Other bacterial infections of unspecified site: Secondary | ICD-10-CM

## 2020-08-08 DIAGNOSIS — T8613 Kidney transplant infection: Secondary | ICD-10-CM | POA: Diagnosis present

## 2020-08-08 DIAGNOSIS — E871 Hypo-osmolality and hyponatremia: Secondary | ICD-10-CM | POA: Diagnosis present

## 2020-08-08 DIAGNOSIS — R509 Fever, unspecified: Secondary | ICD-10-CM | POA: Diagnosis not present

## 2020-08-08 DIAGNOSIS — I129 Hypertensive chronic kidney disease with stage 1 through stage 4 chronic kidney disease, or unspecified chronic kidney disease: Secondary | ICD-10-CM | POA: Diagnosis present

## 2020-08-08 DIAGNOSIS — D6859 Other primary thrombophilia: Secondary | ICD-10-CM | POA: Diagnosis present

## 2020-08-08 LAB — PHOSPHORUS: Phosphorus: 2.4 mg/dL — ABNORMAL LOW (ref 2.5–4.6)

## 2020-08-08 LAB — CBC WITH DIFFERENTIAL/PLATELET
Abs Immature Granulocytes: 0.07 10*3/uL (ref 0.00–0.07)
Basophils Absolute: 0 10*3/uL (ref 0.0–0.1)
Basophils Relative: 0 %
Eosinophils Absolute: 0 10*3/uL (ref 0.0–0.5)
Eosinophils Relative: 0 %
HCT: 33.1 % — ABNORMAL LOW (ref 39.0–52.0)
Hemoglobin: 11 g/dL — ABNORMAL LOW (ref 13.0–17.0)
Immature Granulocytes: 1 %
Lymphocytes Relative: 5 %
Lymphs Abs: 0.6 10*3/uL — ABNORMAL LOW (ref 0.7–4.0)
MCH: 30.6 pg (ref 26.0–34.0)
MCHC: 33.2 g/dL (ref 30.0–36.0)
MCV: 91.9 fL (ref 80.0–100.0)
Monocytes Absolute: 0.8 10*3/uL (ref 0.1–1.0)
Monocytes Relative: 7 %
Neutro Abs: 10.5 10*3/uL — ABNORMAL HIGH (ref 1.7–7.7)
Neutrophils Relative %: 87 %
Platelets: 212 10*3/uL (ref 150–400)
RBC: 3.6 MIL/uL — ABNORMAL LOW (ref 4.22–5.81)
RDW: 15.2 % (ref 11.5–15.5)
WBC: 11.9 10*3/uL — ABNORMAL HIGH (ref 4.0–10.5)
nRBC: 0 % (ref 0.0–0.2)

## 2020-08-08 LAB — COMPREHENSIVE METABOLIC PANEL
ALT: 11 U/L (ref 0–44)
AST: 12 U/L — ABNORMAL LOW (ref 15–41)
Albumin: 2.8 g/dL — ABNORMAL LOW (ref 3.5–5.0)
Alkaline Phosphatase: 67 U/L (ref 38–126)
Anion gap: 8 (ref 5–15)
BUN: 25 mg/dL — ABNORMAL HIGH (ref 8–23)
CO2: 24 mmol/L (ref 22–32)
Calcium: 9.2 mg/dL (ref 8.9–10.3)
Chloride: 102 mmol/L (ref 98–111)
Creatinine, Ser: 1.29 mg/dL — ABNORMAL HIGH (ref 0.61–1.24)
GFR, Estimated: 58 mL/min — ABNORMAL LOW (ref >60)
Glucose, Bld: 335 mg/dL — ABNORMAL HIGH (ref 70–99)
Potassium: 5 mmol/L (ref 3.5–5.1)
Sodium: 134 mmol/L — ABNORMAL LOW (ref 135–145)
Total Bilirubin: 0.4 mg/dL (ref 0.3–1.2)
Total Protein: 6.1 g/dL — ABNORMAL LOW (ref 6.5–8.1)

## 2020-08-08 LAB — MAGNESIUM: Magnesium: 2 mg/dL (ref 1.7–2.4)

## 2020-08-08 LAB — GLUCOSE, CAPILLARY: Glucose-Capillary: 133 mg/dL — ABNORMAL HIGH (ref 70–99)

## 2020-08-08 MED ORDER — K PHOS MONO-SOD PHOS DI & MONO 155-852-130 MG PO TABS
500.0000 mg | ORAL_TABLET | Freq: Once | ORAL | Status: AC
  Administered 2020-08-08: 500 mg via ORAL
  Filled 2020-08-08: qty 2

## 2020-08-08 MED ORDER — RIVAROXABAN 15 MG PO TABS
15.0000 mg | ORAL_TABLET | Freq: Every day | ORAL | Status: DC
  Administered 2020-08-08: 15 mg via ORAL
  Filled 2020-08-08: qty 1

## 2020-08-08 MED ORDER — PANTOPRAZOLE SODIUM 40 MG PO TBEC
40.0000 mg | DELAYED_RELEASE_TABLET | Freq: Every day | ORAL | Status: DC
  Administered 2020-08-09: 40 mg via ORAL
  Filled 2020-08-08: qty 1

## 2020-08-08 NOTE — Progress Notes (Signed)
OT Cancellation Note  Patient Details Name: Cristian Henderson MRN: [id number] DOB: [birth year]   Cancelled Treatment:    Reason Eval/Treat Not Completed: OT screened, no needs identified, will sign off. Pt up standing in room upon OT arrival and reporting he had just walked around the halls again.   Sorcha Rotunno OT, MOT   Larey Seat 08/08/2020, 1:52 PM

## 2020-08-08 NOTE — Progress Notes (Signed)
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  Patient initially was scheduled for Foley catheter placement and cystoscopy and retrograde pyelogram and ureteral stent placement however because he complained of a 2-day onset of burning and discomfort in his bladder as well as a fever he presented to the ED for further evaluation.  Work-up in the ED showed that he had a leukocytosis and his BUN/creatinine was 20/1.76.  Urology was consulted and started on IV antibiotics and he was taken for cystoscopy today and attempted ureteral stent placement in the transplanted kidney however this was unsuccessful.  Plan was for Interventional Radiology to place a nephrostomy tube but after review of Renal U/S showed improvement in the hydronephrosis and because of his improving creatinine the interventional radiology team felt no indication for nephrostomy creation in the transplanted graft in the setting of improving renal function and minimal hydronephrosis.  Urology is doing a trial of void and his urine culture is now showing Klebsiella oxytocin.  Once sensitivities are resulted likely can be changed to po Abx and discharged if he passes his trial of void.  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   Atrial fibrillation,  chronic (HCC)  UTI, poA with Sepsis ruled out -Patient placed in Med-Surge Observation  -Did NOT meet Sepsis Criteria and only had a Leukocytosis on Admission of 13.9 and now improved to 12.5; T Max was 99.1 but he stated prior to Admit his Temperature was 102 -LA was 1.3 and went to 0.9 -U/A done and showed a Hazy Appearance, with Large Hgb, Moderate Leukocytes, Positive Nitrites, Many Bacteria, >50 RBC/HPF, >50 WBC -Patient has Irritative Bladder Symptoms -He was started on IV Ceftriaxone which we will continue  -Given Acetaminophen for fevers -Follow urinary culture results and they are showing greater than 100,000 colony-forming units of gram-negative rods Klebsiella Oxytoca  -Patient was  to have a nephrostomy tube placed in his transplanted kidney by IR but Renal U/S was obtained and showed "Moderate hydronephrosis of the transplant kidney in the right iliac fossa. 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 -Paitent's BUN/Cr went from 28/1.76 -> 25/1.52 and today it is 25/1.29 -C/w Cyclosporine 75 mg po BID and with Mycophenolate 180 mg BID -Resume Sodium Bicarbonate 650 mg po BID -Currently holding his Lasix 40 mg po Daily but can resume at discharge  Hypomagnesemia -Paitent's Mag Level was 1.6 and improved to 2.0 -Continue to Monitor and Replete as Necessary -Repeat Mag Level in the AM   Paroxysmal Atrial Fibrillation -Resume Dilitazem 240 mg  Daily when tolerating po -Was anticoagulation with Rivaroxaban but will resume today per urology recommendations and have resumed today   Acquired Thrombophilia -Patient has a Hypercoaguable state 2/2 to  PAF -Resume Rivaroxaban now since he is stable from a Urological Perspective   Anxiety and Depression/PTSD  -C/w Alprazolam 0.25 mg po Dailyprn and with Escitalopram 10 mg po Daily   Hydronephrosis with Ureteral Stricture Benign Prostatic Hyperplasia -Patient was supposed to have a Cystoscopy with Retrograde Pyelogram and Ureteral Stent placed but because of Urinay Symptoms and Fever for 2 days he presented to the ED early -Urology consulted and patient to go for Urological Procedure today; Dr. Noah Delaine the patient to the OR and do a cystoscopy and identify the transplant ureteral orifice in the right low.  He attempted to cannulate the orifice with a ureteral catheter and reservoir and a sensor wire which was unsuccessful.  Foley catheter was placed and he was admitted for IV antibiotics and taken back after he was extubated and is going to be scheduled for a nephrectomy tube placement and his transplant kidney -Resume Tamsulosin 0.4 mg po Daily  -Renal U/S being ordered and as above   Hyponatremia -Mild and Improving. Na+ has gone from 131 -> 134 -> 134 -Was onLR at 50 mL/hr and now stopped  -Continue to Monitor and Trend and Repeat CMP in the AM  Normocytic Anemia/Anemia of Chronic Kidney Disease -Patient's Hgb/Hct went from 11.7/36.5 -> 10.7/31.9 -> 11.0/33.1 -Check Anemia Panel in the AM as an outpatient  -Continue to Monitor for S/Sx of Bleeding; His UA did have Hgb in it with >50 RBC/HPF but this is improving  -Repeat CBC in the AM   Gout -C/w Allopurinol 50 mg po Daily   GERD -Resumed po Pantoprazole 40 mg po Daily when Tolerating Diet   Essential HTN -C/w Diltiazem 240 mg po Daily  -Resume Hydralazine 25 mg po TID as well when out of Urological Procedure  -Currently holding Lasix 40 mg po Daily and resume at D/C  -Continue to Monitor BP per Protocol -Last BP was 119/46  HLD -C/w with Ezetimibe 10 mg po Daily   DVT prophylaxis: His Rivaroxaban has been resumed  Code  Status: FULL CODE Family Communication: Discussed with wife at bedside Disposition Plan: Anticipating discharge in the next 24 to 48 hours pending urine sensitivities and trauma point as well as PT and OT evaluation  Status is: Inpatient  Remains inpatient appropriate because:Unsafe d/c plan, IV treatments appropriate due to intensity of illness or inability to take PO and Inpatient level of care appropriate due to severity of illness   Dispo: The patient is from: Home              Anticipated d/c is to: Home              Patient currently is not medically stable to d/c.   Difficult to place patient No  Consultants:   Urology   Interventional Radiology   Procedures: Cystoscopy as Stent placement Unsuccessful  Antimicrobials:  Anti-infectives (From admission, onward)   Start     Dose/Rate Route Frequency Ordered Stop   08/07/20 0841  ceFAZolin (ANCEF) IVPB 2g/100 mL premix  Status:  Discontinued        2 g 200 mL/hr over 30 Minutes Intravenous 30 min pre-op 08/07/20 0841 08/07/20 1245   08/07/20 0700  cefTRIAXone (ROCEPHIN) 1 g in sodium chloride 0.9 % 100 mL IVPB        1 g  200 mL/hr over 30 Minutes Intravenous Every 24 hours 08/07/20 0658     08/06/20 2230  cefTRIAXone (ROCEPHIN) 2 g in sodium chloride 0.9 % 100 mL IVPB        2 g 200 mL/hr over 30 Minutes Intravenous  Once 08/06/20 2223 08/06/20 2316        Subjective: Seen and examined at bedside and he was doing well.  Denied any nausea or vomiting.  Denies any chest pain, shortness of breath lightheadedness or dizziness.  His urinary symptoms have improved.  Happy that he no longer needs a nephrostomy tube and hopeful to go home later pending his trial of void and urine sensitivities.  No other concerns or complaints at this time.  Objective: Vitals:   08/07/20 2110 08/08/20 0141 08/08/20 0542 08/08/20 0732  BP: (!) 149/55 140/60 (!) 115/46 (!) 119/46  Pulse: 75 70 62 (!) 52  Resp: 18 18 20 18   Temp: 97.9 F (36.6  C) 98 F (36.7 C) (!) 97.4 F (36.3 C) 97.7 F (36.5 C)  TempSrc:  Oral  Oral  SpO2: 97% 98% 96% 96%  Weight:      Height:        Intake/Output Summary (Last 24 hours) at 08/08/2020 1138 Last data filed at 08/08/2020 0900 Gross per 24 hour  Intake 890 ml  Output 2450 ml  Net -1560 ml   Filed Weights   08/06/20 1838  Weight: 74.8 kg   Examination: Physical Exam:  Constitutional: WN/WD overweight Caucasian male in NAD and appears calm and comfortable Eyes: Lids and conjunctivae normal, sclerae anicteric  ENMT: External Ears, Nose appear normal. Grossly normal hearing.  Neck: Appears normal, supple, no cervical masses, normal ROM, no appreciable thyromegaly; no JVD Respiratory: Diminished to auscultation bilaterally, no wheezing, rales, rhonchi or crackles. Normal respiratory effort and patient is not tachypenic. No accessory muscle use. Unlabored breathing  Cardiovascular: RRR, no murmurs / rubs / gallops. S1 and S2 auscultated. No extremity edema. Abdomen: Soft, non-tender, Slightly distended 2/2 to body habitus. Bowel sounds positive.  GU: Deferred. Foley in place with Darker color urine but no overt blood Musculoskeletal: No clubbing / cyanosis of digits/nails. No joint deformity upper and lower extremities.  Skin: No rashes, lesions, ulcers on a limited skin evaluation. No induration; Warm and dry.  Neurologic: CN 2-12 grossly intact with no focal deficits.  Romberg sign and cerebellar reflexes not assessed.  Psychiatric: Normal judgment and insight. Alert and oriented x 3. Normal mood and appropriate affect.   Data Reviewed: I have personally reviewed following labs and imaging studies  CBC: Recent Labs  Lab 08/06/20 1925 08/07/20 0340 08/08/20 0622  WBC 13.9* 12.5* 11.9*  NEUTROABS 10.8*  --  10.5*  HGB 11.7* 10.7* 11.0*  HCT 36.5* 31.9* 33.1*  MCV 94.1 91.7 91.9  PLT 208 196 854   Basic Metabolic Panel: Recent Labs  Lab 08/06/20 1925 08/07/20 0340  08/08/20 0622  NA 131* 134* 134*  K 4.3 4.3 5.0  CL 100 103 102  CO2 23 24 24   GLUCOSE 129* 220* 335*  BUN 28* 25* 25*  CREATININE 1.76* 1.52* 1.29*  CALCIUM 8.9 8.3* 9.2  MG  --  1.6* 2.0  PHOS  --  2.6 2.4*   GFR: Estimated Creatinine Clearance: 46.7 mL/min (A) (by C-G formula based on SCr of 1.29 mg/dL (H)). Liver Function Tests: Recent Labs  Lab 08/06/20 1925 08/07/20 0340 08/08/20 0622  AST 11* 17 12*  ALT 12 10 11  ALKPHOS 71 61 67  BILITOT 0.7 0.6 0.4  PROT 7.0 6.0* 6.1*  ALBUMIN 3.6 2.9* 2.8*   No results for input(s): LIPASE, AMYLASE in the last 168 hours. No results for input(s): AMMONIA in the last 168 hours. Coagulation Profile: Recent Labs  Lab 08/07/20 0340  INR 1.8*   Cardiac Enzymes: No results for input(s): CKTOTAL, CKMB, CKMBINDEX, TROPONINI in the last 168 hours. BNP (last 3 results) No results for input(s): PROBNP in the last 8760 hours. HbA1C: No results for input(s): HGBA1C in the last 72 hours. CBG: Recent Labs  Lab 08/07/20 1155  GLUCAP 133*   Lipid Profile: No results for input(s): CHOL, HDL, LDLCALC, TRIG, CHOLHDL, LDLDIRECT in the last 72 hours. Thyroid Function Tests: No results for input(s): TSH, T4TOTAL, FREET4, T3FREE, THYROIDAB in the last 72 hours. Anemia Panel: No results for input(s): VITAMINB12, FOLATE, FERRITIN, TIBC, IRON, RETICCTPCT in the last 72 hours. Sepsis Labs: Recent Labs  Lab 08/06/20 1925 08/06/20 2130  LATICACIDVEN 1.3 0.9    Recent Results (from the past 240 hour(s))  SARS CORONAVIRUS 2 (TAT 6-24 HRS) Nasopharyngeal Nasopharyngeal Swab     Status: None   Collection Time: 08/05/20  9:26 AM   Specimen: Nasopharyngeal Swab  Result Value Ref Range Status   SARS Coronavirus 2 NEGATIVE NEGATIVE Final    Comment: (NOTE) SARS-CoV-2 target nucleic acids are NOT DETECTED.  The SARS-CoV-2 RNA is generally detectable in upper and lower respiratory specimens during the acute phase of infection.  Negative results do not preclude SARS-CoV-2 infection, do not rule out co-infections with other pathogens, and should not be used as the sole basis for treatment or other patient management decisions. Negative results must be combined with clinical observations, patient history, and epidemiological information. The expected result is Negative.  Fact Sheet for Patients: SugarRoll.be  Fact Sheet for Healthcare Providers: https://www.woods-mathews.com/  This test is not yet approved or cleared by the [place] FDA and  has been authorized for detection and/or diagnosis of SARS-CoV-2 by FDA under an Emergency Use Authorization (EUA). This EUA will remain  in effect (meaning this test can be used) for the duration of the COVID-19 declaration under Se ction 564(b)(1) of the Act, 21 U.S.C. section 360bbb-3(b)(1), unless the authorization is terminated or revoked sooner.  Performed at [facility], [place] [address] [place], [place] [address]   Urine Culture     Status: Abnormal (Preliminary result)   Collection Time: 08/06/20  8:44 PM   Specimen: Urine, Random  Result Value Ref Range Status   Specimen Description   Final    URINE, RANDOM Performed at [facility], [address] [place], [place] [address]    Special Requests   Final    NONE Performed at [facility], [address], [place], [place] [address]    Culture (A)  Final    >=100,000 COLONIES/mL KLEBSIELLA OXYTOCA SUSCEPTIBILITIES TO FOLLOW Performed at [facility], [place] [address] [place],  [address]    Report Status PENDING  Incomplete    RN Pressure Injury Documentation:     Estimated body mass index is 27.46 kg/m as calculated from the following:   Height as of this encounter: 5\' 5"  (1.651 m).   Weight as of this encounter: 74.8 kg.  Malnutrition Type:   Malnutrition Characteristics:   Nutrition Interventions:     Radiology Studies: DG  Chest 2 View  Result Date: 08/06/2020 CLINICAL DATA:  Fever EXAM: CHEST - 2 VIEW COMPARISON:  06/29/2020 FINDINGS: Lungs are well expanded, symmetric, and clear. No pneumothorax or pleural effusion. Cardiac size within normal limits. Pulmonary vascularity is normal. Osseous structures are age-appropriate. Left total shoulder arthroplasty has been performed. No acute bone abnormality. IMPRESSION: No active cardiopulmonary disease. Electronically Signed   By: Fidela Salisbury MD   On: 08/06/2020 19:33   US RENAL  Result Date: 08/08/2020 CLINICAL DATA:  Hydronephrosis of transplant kidney. Ureteral orifice could not be cannulated for retrograde stent placement cystoscopically. EXAM: RENAL / URINARY TRACT ULTRASOUND COMPLETE COMPARISON:  07/28/2020 CT FINDINGS: Transplant kidney: Location: Right iliac fossa. Renal measurements: 11.5 x 7.8 x 5.4 = volume: 254 mL. Echogenicity within normal limits. No mass visualized. There is moderate hydronephrosis. Right Kidney: Renal measurements: 9 x 4.8 x 4.4 = volume: 97 mL. Echogenic renal parenchyma. No hydronephrosis. 2 subcentimeter cystic appearing lesions in the lower pole. Left Kidney: Renal measurements: 9.7 x 7.3 x 6.6 = volume: 244 mL. Echogenic parenchyma. No hydronephrosis. 6.4 cm cystic lesion in the lower pole. Bladder: Decompressed by Foley catheter. Other: None. IMPRESSION: 1. Moderate hydronephrosis of the transplant kidney in the right iliac fossa. 2. Bilateral native renal atrophy with cystic lesions, no hydronephrosis. Electronically Signed   By: Lucrezia Europe M.D.   On: 08/08/2020 09:51   DG C-Arm 1-60 Min-No Report  Result Date: 08/07/2020 Fluoroscopy was utilized by the requesting physician.  No radiographic interpretation.   Scheduled Meds: . allopurinol  50 mg Oral Daily  . Chlorhexidine Gluconate Cloth  6 each Topical Daily  . cholecalciferol  1,000 Units Oral Daily  . cycloSPORINE modified  75 mg Oral BID  . diltiazem  240 mg Oral Daily  .  escitalopram  10 mg Oral Daily  . ezetimibe  10 mg Oral Daily  . hydrALAZINE  25 mg Oral TID  . mycophenolate  180 mg Oral BID  . pantoprazole (PROTONIX) IV  40 mg Intravenous Q24H  . phosphorus  500 mg Oral Once  . sodium bicarbonate  650 mg Oral BID  . tamsulosin  0.4 mg Oral Daily   Continuous Infusions: . cefTRIAXone (ROCEPHIN)  IV Stopped (08/07/20 2536)    LOS: 0 days   Kerney Elbe, DO [facility] PAGER is on [place]  If 7PM-7AM, please contact night-coverage [email or web address]

## 2020-08-08 NOTE — Progress Notes (Signed)
1 Day Post-Op Subjective: Patient reports no pain. Urine is celar. Renal US from yesterday showed resolution the hydronephrosis in his transplanted kidney. Creatinine 1.29 today  Objective: Vital signs in last 24 hours: Temp:  [97.4 F (36.3 C)-98.7 F (37.1 C)] 97.7 F (36.5 C) (05/06 0732) Pulse Rate:  [52-75] 52 (05/06 0732) Resp:  [16-20] 18 (05/06 0732) BP: (115-149)/(46-68) 119/46 (05/06 0732) SpO2:  [96 %-98 %] 96 % (05/06 0732)  Intake/Output from previous day: 05/05 0701 - 05/06 0700 In: 2097.9 [P.O.:400; I.V.:1600; IV Piggyback:97.9] Out: 2450 [Urine:2450] Intake/Output this shift: Total I/O In: 240 [P.O.:240] Out: -   Physical Exam:  General:alert, cooperative and appears stated age GI: soft, non tender, normal bowel sounds, no palpable masses, no organomegaly, no inguinal hernia Male genitalia: not done Extremities: extremities normal, atraumatic, no cyanosis or edema  Lab Results: Recent Labs    08/06/20 1925 08/07/20 0340 08/08/20 0622  HGB 11.7* 10.7* 11.0*  HCT 36.5* 31.9* 33.1*   BMET Recent Labs    08/07/20 0340 08/08/20 0622  NA 134* 134*  K 4.3 5.0  CL 103 102  CO2 24 24  GLUCOSE 220* 335*  BUN 25* 25*  CREATININE 1.52* 1.29*  CALCIUM 8.3* 9.2   Recent Labs    08/07/20 0340  INR 1.8*   No results for input(s): LABURIN in the last 72 hours. Results for orders placed or performed during the hospital encounter of 08/06/20  Urine Culture     Status: Abnormal (Preliminary result)   Collection Time: 08/06/20  8:44 PM   Specimen: Urine, Random  Result Value Ref Range Status   Specimen Description   Final    URINE, RANDOM Performed at [facility], [address] [place], [place] [address]    Special Requests   Final    NONE Performed at [facility], [address], [place], [place] [address]    Culture (A)  Final    >=100,000 COLONIES/mL KLEBSIELLA OXYTOCA SUSCEPTIBILITIES TO FOLLOW Performed at [facility]  [address], [place], [place] [address]    Report Status PENDING  Incomplete    Studies/Results: DG Chest 2 View  Result Date: 08/06/2020 CLINICAL DATA:  Fever EXAM: CHEST - 2 VIEW COMPARISON:  06/29/2020 FINDINGS: Lungs are well expanded, symmetric, and clear. No pneumothorax or pleural effusion. Cardiac size within normal limits. Pulmonary vascularity is normal. Osseous structures are age-appropriate. Left total shoulder arthroplasty has been performed. No acute bone abnormality. IMPRESSION: No active cardiopulmonary disease. Electronically Signed   By: Fidela Salisbury MD   On: 08/06/2020 19:33   US RENAL  Result Date: 08/08/2020 CLINICAL DATA:  Hydronephrosis of transplant kidney. Ureteral orifice could not be cannulated for retrograde stent placement cystoscopically. EXAM: RENAL / URINARY TRACT ULTRASOUND COMPLETE COMPARISON:  07/28/2020 CT FINDINGS: Transplant kidney: Location: Right iliac fossa. Renal measurements: 11.5 x 7.8 x 5.4 = volume: 254 mL. Echogenicity within normal limits. No mass visualized. There is moderate hydronephrosis. Right Kidney: Renal measurements: 9 x 4.8 x 4.4 = volume: 97 mL. Echogenic renal parenchyma. No hydronephrosis. 2 subcentimeter cystic appearing lesions in the lower pole. Left Kidney: Renal measurements: 9.7 x 7.3 x 6.6 = volume: 244 mL. Echogenic parenchyma. No hydronephrosis. 6.4 cm cystic lesion in the lower pole. Bladder: Decompressed by Foley catheter. Other: None. IMPRESSION: 1. Moderate hydronephrosis of the transplant kidney in the right iliac fossa. 2. Bilateral native renal atrophy with cystic lesions, no hydronephrosis. Electronically Signed   By: Eden Emms.D.  On: 08/08/2020 09:51   DG C-Arm 1-60 Min-No Report  Result Date: 08/07/2020 Fluoroscopy was utilized by the requesting physician.  No radiographic interpretation.    Assessment/Plan: 75yo with history of hydronephrosis of transplanted kidney, urinary retention and UTI  1. Hydronephrosis has  resolved and right nephrostomy tube placement is not longer needed 2. The foley catheter can be removed and PVRs should be monitored today 3. UTI: continue broad spectrum antibiotics pending urine culture   LOS: 0 days   Nicolette Bang 08/08/2020, 1:03 PM

## 2020-08-08 NOTE — Progress Notes (Signed)
PT Cancellation Note  Patient Details Name: Cristian Henderson MRN: [id number] DOB: 05/11/1945   Cancelled Treatment:    Reason Eval/Treat Not Completed: PT screened, no needs identified, will sign off.  Patient ambulating independently in room and hallways with no problems.   12:28 PM, 08/08/20 Lonell Grandchild, MPT Physical Therapist with [facility] 336 [phone] office [phone] mobile phone

## 2020-08-09 DIAGNOSIS — N1832 Chronic kidney disease, stage 3b: Secondary | ICD-10-CM | POA: Diagnosis not present

## 2020-08-09 DIAGNOSIS — N3000 Acute cystitis without hematuria: Secondary | ICD-10-CM | POA: Diagnosis not present

## 2020-08-09 DIAGNOSIS — I10 Essential (primary) hypertension: Secondary | ICD-10-CM | POA: Diagnosis not present

## 2020-08-09 DIAGNOSIS — I482 Chronic atrial fibrillation, unspecified: Secondary | ICD-10-CM | POA: Diagnosis not present

## 2020-08-09 LAB — CBC WITH DIFFERENTIAL/PLATELET
Abs Immature Granulocytes: 0.08 10*3/uL — ABNORMAL HIGH (ref 0.00–0.07)
Basophils Absolute: 0 10*3/uL (ref 0.0–0.1)
Basophils Relative: 0 %
Eosinophils Absolute: 0 10*3/uL (ref 0.0–0.5)
Eosinophils Relative: 0 %
HCT: 31.8 % — ABNORMAL LOW (ref 39.0–52.0)
Hemoglobin: 10.4 g/dL — ABNORMAL LOW (ref 13.0–17.0)
Immature Granulocytes: 1 %
Lymphocytes Relative: 12 %
Lymphs Abs: 1.3 10*3/uL (ref 0.7–4.0)
MCH: 30.2 pg (ref 26.0–34.0)
MCHC: 32.7 g/dL (ref 30.0–36.0)
MCV: 92.4 fL (ref 80.0–100.0)
Monocytes Absolute: 0.7 10*3/uL (ref 0.1–1.0)
Monocytes Relative: 7 %
Neutro Abs: 8.1 10*3/uL — ABNORMAL HIGH (ref 1.7–7.7)
Neutrophils Relative %: 80 %
Platelets: 235 10*3/uL (ref 150–400)
RBC: 3.44 MIL/uL — ABNORMAL LOW (ref 4.22–5.81)
RDW: 15.2 % (ref 11.5–15.5)
WBC: 10.2 10*3/uL (ref 4.0–10.5)
nRBC: 0 % (ref 0.0–0.2)

## 2020-08-09 LAB — COMPREHENSIVE METABOLIC PANEL
ALT: 12 U/L (ref 0–44)
AST: 12 U/L — ABNORMAL LOW (ref 15–41)
Albumin: 2.9 g/dL — ABNORMAL LOW (ref 3.5–5.0)
Alkaline Phosphatase: 60 U/L (ref 38–126)
Anion gap: 9 (ref 5–15)
BUN: 35 mg/dL — ABNORMAL HIGH (ref 8–23)
CO2: 26 mmol/L (ref 22–32)
Calcium: 9.1 mg/dL (ref 8.9–10.3)
Chloride: 100 mmol/L (ref 98–111)
Creatinine, Ser: 1.48 mg/dL — ABNORMAL HIGH (ref 0.61–1.24)
GFR, Estimated: 49 mL/min — ABNORMAL LOW (ref >60)
Glucose, Bld: 183 mg/dL — ABNORMAL HIGH (ref 70–99)
Potassium: 4.4 mmol/L (ref 3.5–5.1)
Sodium: 135 mmol/L (ref 135–145)
Total Bilirubin: 0.5 mg/dL (ref 0.3–1.2)
Total Protein: 6 g/dL — ABNORMAL LOW (ref 6.5–8.1)

## 2020-08-09 LAB — URINE CULTURE: Culture: >=100000 — AB

## 2020-08-09 LAB — MAGNESIUM: Magnesium: 1.8 mg/dL (ref 1.7–2.4)

## 2020-08-09 LAB — PHOSPHORUS: Phosphorus: 4.3 mg/dL (ref 2.5–4.6)

## 2020-08-09 MED ORDER — MAGNESIUM SULFATE 2 GM/50ML IV SOLN
2.0000 g | Freq: Once | INTRAVENOUS | Status: AC
  Administered 2020-08-09: 2 g via INTRAVENOUS
  Filled 2020-08-09: qty 50

## 2020-08-09 MED ORDER — CEPHALEXIN 500 MG PO CAPS: 500.0000 mg | ORAL_CAPSULE | Freq: Three times a day (TID) | ORAL | 0 refills | Status: AC

## 2020-08-09 NOTE — Discharge Summary (Signed)
Physician Discharge Summary  Cristian Henderson IOM:[id number] DOB: Jun 23, 1945 DOA: 08/06/2020  PCP: [name], MD  Admit date: 08/06/2020 Discharge date: 08/09/2020  Admitted From: Home Disposition:  Home  Recommendations for Outpatient Follow-up:  1. Follow up with PCP in 1-2 weeks 2. Follow up with Nephrology within 1-2 weeks 3. Follow up with Urology Dr. Alyson Ingles in 1-2 weeks 4. Please obtain CMP/CBC, Mag, Phos in one week 5. 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  Patient initially was scheduled for Foley catheter placement and cystoscopy and retrograde pyelogram and ureteral stent placement however because he complained of a 2-day onset of burning and discomfort in his bladder as well as a fever he presented to the ED for further evaluation.  Work-up in the ED showed that he had a leukocytosis and his BUN/creatinine was 20/1.76.  Urology was consulted and started on IV antibiotics and he was taken for cystoscopy today and attempted ureteral stent placement in the transplanted kidney however this was unsuccessful.  Plan was for Interventional Radiology to place a nephrostomy tube but after review of Renal U/S showed improvement in the hydronephrosis and because of his improving creatinine the interventional radiology team felt no indication for nephrostomy creation in the transplanted graft in the setting of improving renal function and  minimal hydronephrosis.  Urology is doing a trial of void and his urine culture is now showing Klebsiella Oxytoca.  Once sensitivities are resulted likely can be changed to po Abx and discharged if he passes his trial of void.  Klebsiella Oxytoca was pansensitive and he is transition to p.o. Keflex for total of 10 days of antibiotic therapy.  He passed his trial of void and urinated quite a bit last night and this morning.  Ambulated without issues and was stable to be discharged home and did not have any blood in his urine.  He will follow-up with his nephrologist as neurologist in outpatient setting.  He states his baseline creatinine is 1.7 and he is stable be discharged home with creatinine 1.48.  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   Atrial fibrillation, chronic (HCC) UTI, poA with Sepsis ruled out -Patient placed in Med-Surge Observation  -Did NOT meet Sepsis Criteria and only had a Leukocytosis on Admission of 13.9 and now improved to 12.5; T Max was 99.1 but he stated prior to Admit his Temperature was 102 -LA was 1.3 and went to 0.9 -U/A done and showed a Hazy Appearance, with Large Hgb, Moderate Leukocytes, Positive Nitrites, Many Bacteria, >50 RBC/HPF, >50 WBC -Patient has Irritative Bladder Symptoms -He was started on IV Ceftriaxone which we will continue  -Given Acetaminophen for fevers -Follow urinary culture results and they are showing greater than 100,000 colony-forming units of gram-negative rods Klebsiella Oxytoca  which was pansensitive -  WBC resolved to 10.2 -Patient was  to have a nephrostomy tube placed in his transplanted kidney by IR but Renal U/S was obtained and showed "Moderate hydronephrosis of the transplant kidney in the right iliac  fossa. Bilateral native renal atrophy with cystic lesions, no hydronephrosis" -IR feels no indication for nephrostomy creation of transplant graft in the setting of improving renal function as well as minimal hydronephrosis -Pathology now recommending trial of void and changing antibiotics to targeted therapy; will discharge with p.o. 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 -Paitent's BUN/Cr went from 28/1.76 -> 25/1.52 and yesterday it is 25/1.29 and today it is 35/1.48 better than baseline -C/w Cyclosporine 75 mg po BID and with Mycophenolate 180 mg BID -Resume Sodium Bicarbonate 650 mg po BID -Currently holding his Lasix 40 mg po Daily but can resume at discharge -Follow-up with nephrology in outpatient setting  Hypomagnesemia -Paitent's Mag Level was 1.8 -Continue to Monitor and Replete as Necessary -Repeat Mag Level in the AM   Paroxysmal Atrial Fibrillation -Resume Dilitazem 240 mg Daily when tolerating po -Was anticoagulation with Rivaroxaban but will resume today per urology recommendations and have resumed today   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 Noah Delaine the patient to the OR and do a cystoscopy and identify the transplant ureteral orifice in the right low.  He attempted to cannulate the orifice with a ureteral catheter and reservoir and  a sensor wire which was unsuccessful.  Foley catheter was placed and he was admitted for IV antibiotics and taken back after he was extubated and is going to be scheduled for a nephrectomy tube placement and his transplant kidney -Resume Tamsulosin 0.4 mg po Daily  -Renal U/S being ordered and as above   Hyponatremia -Mild and Improving. Na+ has gone from 131 -> 134 -> 134 and today is 135 -Was onLR at 50 mL/hr and now stopped  -Continue to Monitor and Trend and Repeat CMP in the AM  Normocytic Anemia/Anemia of Chronic Kidney Disease -Patient's Hgb/Hct went from 11.7/36.5 -> 10.7/31.9 -> 11.0/33.1 and today it is 10.4/31.8 -Check Anemia Panel in the AM as an outpatient  -Continue to Monitor for S/Sx of Bleeding; His UA did have Hgb in it with >50 RBC/HPF but this is improving  -Repeat CBC in the AM   Gout -C/w Allopurinol 50 mg po Daily   GERD -Resumed po Pantoprazole 40 mg po Daily when Tolerating Diet   Essential HTN -C/w Diltiazem 240 mg po Daily  -Resume Hydralazine 25 mg po TID as well when out of Urological Procedure  -Currently holding Lasix 40 mg po Daily and resume at D/C  -Continue to Monitor BP per Protocol -Last BP was  141/68  HLD -C/w with Ezetimibe 10 mg po Daily   Discharge Instructions  Discharge Instructions    Call MD for:  difficulty breathing, headache or visual disturbances   Complete by: As  directed    Call MD for:  extreme fatigue   Complete by: As directed    Call MD for:  hives   Complete by: As directed    Call MD for:  persistant dizziness or light-headedness   Complete by: As directed    Call MD for:  persistant nausea and vomiting   Complete by: As directed    Call MD for:  redness, tenderness, or signs of infection (pain, swelling, redness, odor or green/yellow discharge around incision site)   Complete by: As directed    Call MD for:  severe uncontrolled pain   Complete by: As directed    Call MD for:  temperature >100.4   Complete  by: As directed    Diet - low sodium heart healthy   Complete by: As directed    Discharge instructions   Complete by: As directed    You were cared for by a hospitalist during your hospital stay. If you have any questions about your discharge medications or the care you received while you were in the hospital after you are discharged, you can call the unit and ask to speak with the hospitalist on call if the hospitalist that took care of you is not available. Once you are discharged, your primary care physician will handle any further medical issues. Please note that NO REFILLS for any discharge medications will be authorized once you are discharged, as it is imperative that you return to your primary care physician (or establish a relationship with a primary care physician if you do not have one) for your aftercare needs so that they can reassess your need for medications and monitor your lab values.  Follow up with PCP and Urology within 1 week. Take all medications as prescribed. If symptoms change or worsen please return to the ED for evaluation   Increase activity slowly   Complete by: As directed    No wound care   Complete by: As directed      Allergies as of 08/09/2020      Reactions   Doxycycline Hives   Doxycycline Hyclate Hives   Simvastatin Other (See Comments)   Pt states he loss all use of muscles and he was in the hospital for 10 days       Medication List    STOP taking these medications   furosemide 40 MG tablet Commonly known as: LASIX   omeprazole 40 MG capsule Commonly known as: PRILOSEC     TAKE these medications   acetaminophen 500 MG tablet Commonly known as: TYLENOL Take 1,000 mg by mouth every 6 (six) hours as needed.   allopurinol 100 MG tablet Commonly known as: ZYLOPRIM Take 50 mg by mouth daily.   ALPRAZolam 0.25 MG tablet Commonly known as: XANAX Take 0.25 mg by mouth daily as needed.   cephALEXin 500 MG capsule Commonly known as:  KEFLEX Take 1 capsule (500 mg total) by mouth 3 (three) times daily for 7 days.   cholecalciferol 25 MCG (1000 UNIT) tablet Commonly known as: VITAMIN D3 Take 1,000 Units by mouth in the morning and at bedtime.   cycloSPORINE modified 25 MG capsule Commonly known as: NEORAL Take 75 mg by mouth 2 (two) times daily. Take 3 tablets PO BID   diltiazem 240 MG 24 hr capsule Commonly known as: DILACOR XR Take 240 mg by mouth daily.   escitalopram 10 MG tablet Commonly known as: Lexapro Take 1 tablet (10 mg total) by mouth daily.  ezetimibe 10 MG tablet Commonly known as: ZETIA Take 1 tablet (10 mg total) by mouth daily.   Fish Oil 1200 MG Caps Take 2,400 mg by mouth in the morning and at bedtime.   hydrALAZINE 25 MG tablet Commonly known as: APRESOLINE Take 1 tablet (25 mg total) by mouth 3 (three) times daily.   mycophenolate 180 MG EC tablet Commonly known as: MYFORTIC Take 180 mg by mouth 2 (two) times daily.   pantoprazole 40 MG tablet Commonly known as: PROTONIX Take 40 mg by mouth daily.   Rivaroxaban 15 MG Tabs tablet Commonly known as: XARELTO Take 15 mg by mouth daily with breakfast.   sodium bicarbonate 650 MG tablet Take 650 mg by mouth 2 (two) times daily.   tamsulosin 0.4 MG Caps capsule Commonly known as: FLOMAX Take 0.4 mg by mouth daily.   UNABLE TO FIND Vitamin D 10 mcg. Take 1 tablet PO BID.       Allergies  Allergen Reactions  . Doxycycline Hives  . Doxycycline Hyclate Hives  . Simvastatin Other (See Comments)    Pt states he loss all use of muscles and he was in the hospital for 10 days    Consultations:  Urology  Procedures/Studies: DG Chest 2 View  Result Date: 08/06/2020 CLINICAL DATA:  Fever EXAM: CHEST - 2 VIEW COMPARISON:  06/29/2020 FINDINGS: Lungs are well expanded, symmetric, and clear. No pneumothorax or pleural effusion. Cardiac size within normal limits. Pulmonary vascularity is normal. Osseous structures are  age-appropriate. Left total shoulder arthroplasty has been performed. No acute bone abnormality. IMPRESSION: No active cardiopulmonary disease. Electronically Signed   By: Fidela Salisbury MD   On: 08/06/2020 19:33   US RENAL  Result Date: 08/08/2020 CLINICAL DATA:  Hydronephrosis of transplant kidney. Ureteral orifice could not be cannulated for retrograde stent placement cystoscopically. EXAM: RENAL / URINARY TRACT ULTRASOUND COMPLETE COMPARISON:  07/28/2020 CT FINDINGS: Transplant kidney: Location: Right iliac fossa. Renal measurements: 11.5 x 7.8 x 5.4 = volume: 254 mL. Echogenicity within normal limits. No mass visualized. There is moderate hydronephrosis. Right Kidney: Renal measurements: 9 x 4.8 x 4.4 = volume: 97 mL. Echogenic renal parenchyma. No hydronephrosis. 2 subcentimeter cystic appearing lesions in the lower pole. Left Kidney: Renal measurements: 9.7 x 7.3 x 6.6 = volume: 244 mL. Echogenic parenchyma. No hydronephrosis. 6.4 cm cystic lesion in the lower pole. Bladder: Decompressed by Foley catheter. Other: None. IMPRESSION: 1. Moderate hydronephrosis of the transplant kidney in the right iliac fossa. 2. Bilateral native renal atrophy with cystic lesions, no hydronephrosis. Electronically Signed   By: Lucrezia Europe M.D.   On: 08/08/2020 09:51   CARDIAC EVENT MONITOR  Result Date: 08/06/2020 Frequent bouts of rapid PAF/Flutter/Fib that Correlate with symptoms of dyspnea and palpitations Refer to EP for further Rx  DG C-Arm 1-60 Min-No Report  Result Date: 08/07/2020 Fluoroscopy was utilized by the requesting physician.  No radiographic interpretation.   CT Renal Stone Study  Result Date: 07/28/2020 CLINICAL DATA:  Flank pain, kidney stone suspected Patient reports he is unable to void. Renal transplant 2013. Right lower quadrant and left upper abdominal pain today. EXAM: CT ABDOMEN AND PELVIS WITHOUT CONTRAST TECHNIQUE: Multidetector CT imaging of the abdomen and pelvis was performed following  the standard protocol without IV contrast. COMPARISON:  Renal transplant ultrasound 11/26/2020 FINDINGS: Lower chest: Upper normal heart size. There are coronary artery calcifications. No pleural fluid or acute airspace disease. Hepatobiliary: Borderline hepatic steatosis. No discrete focal hepatic lesion on noncontrast exam.  Decompressed gallbladder. No calcified gallstone. No biliary dilatation. Pancreas: No ductal dilatation or inflammation. Spleen: Normal in size without focal abnormality. Adrenals/Urinary Tract: Normal adrenal glands. Marked bilateral native renal atrophy. Low-density lesions arising from both native kidneys measure or simple fluid density and are consistent with cysts. Largest arises from the posterior mid left kidney measures 6.5 cm. No hydronephrosis or ureteral dilatation. Right lower quadrant renal transplant. Mild transplant hydronephrosis but no ureteral dilatation. Minor transplant perinephric edema. No transplant calculi. Possible cyst in the lower pole of the renal transplant, but not well assessed on this noncontrast exam. The urinary bladder is partially distended, no bladder wall thickening or stone. Stomach/Bowel: Unremarkable stomach. Small bowel primarily decompressed. No obstruction or inflammation. Normal appendix. Moderate colonic stool burden. No colonic wall thickening or inflammation. Occasional left colonic diverticula without diverticulitis. Vascular/Lymphatic: Moderate aortic and branch atherosclerosis. No aortic aneurysm no abdominopelvic adenopathy. Reproductive: Enlarged spanning 5.5 cm transverse. Other: Postsurgical change of the anterior abdominal wall. Tiny fat containing umbilical hernia. Minimal fat in both inguinal canals. No ascites, free air, or abdominopelvic fluid collection. Musculoskeletal: Degenerative disc disease at L4-L5 and L5-S1. There are no acute or suspicious osseous abnormalities. IMPRESSION: 1. Right lower quadrant renal transplant. Mild  transplant hydronephrosis but no urolithiasis or cause for obstruction. Minor transplant perinephric edema. The degree of hydronephrosis appears improved from ultrasound earlier this year. 2. Marked bilateral native renal atrophy with bilateral renal cysts. 3. Enlarged prostate spanning 5.5 cm. Aortic Atherosclerosis (ICD10-I70.0). Electronically Signed   By: Keith Rake M.D.   On: 07/28/2020 15:58     Subjective: Seen and examined at bedside and he is feeling much better and denied any chest pain, shortness of breath.  He urinated without issues and postvoid residual was 0.  He is doing fairly well and antibiotics were changed to p.o.  He is stable to be discharged home and follow-up with his PCP, nephrologist as well as urologist outpatient setting and he understands agrees with plan of care.  All questions were answered to the patient and the patient's wife satisfaction.  Discharge Exam: Vitals:   08/08/20 2120 08/09/20 0621  BP: 122/62 (!) 141/68  Pulse: (!) 51 (!) 52  Resp: 17 15  Temp: (!) 97.5 F (36.4 C) 97.8 F (36.6 C)  SpO2: 96% 96%   Vitals:   08/08/20 0732 08/08/20 1444 08/08/20 2120 08/09/20 0621  BP: (!) 119/46 (!) 102/44 122/62 (!) 141/68  Pulse: (!) 52 (!) 52 (!) 51 (!) 52  Resp: 18  17 15   Temp: 97.7 F (36.5 C) 97.8 F (36.6 C) (!) 97.5 F (36.4 C) 97.8 F (36.6 C)  TempSrc: Oral Oral Oral Oral  SpO2: 96% 96% 96% 96%  Weight:      Height:       General: Pt is alert, awake, not in acute distress Cardiovascular: RRR, S1/S2 +, no rubs, no gallops Respiratory: Slightly diminished bilaterally, no wheezing, no rhonchi; not having any respiratory distress and not wearing supplemental oxygen nasal cannula Abdominal: Soft, NT, distended secondary body habitus, bowel sounds + Extremities: no edema, no cyanosis  The results of significant diagnostics from this hospitalization (including imaging, microbiology, ancillary and laboratory) are listed below for reference.     Microbiology: Recent Results (from the past 240 hour(s))  SARS CORONAVIRUS 2 (TAT 6-24 HRS) Nasopharyngeal Nasopharyngeal Swab     Status: None   Collection Time: 08/05/20  9:26 AM   Specimen: Nasopharyngeal Swab  Result Value Ref Range Status  SARS Coronavirus 2 NEGATIVE NEGATIVE Final    Comment: (NOTE) SARS-CoV-2 target nucleic acids are NOT DETECTED.  The SARS-CoV-2 RNA is generally detectable in upper and lower respiratory specimens during the acute phase of infection. Negative results do not preclude SARS-CoV-2 infection, do not rule out co-infections with other pathogens, and should not be used as the sole basis for treatment or other patient management decisions. Negative results must be combined with clinical observations, patient history, and epidemiological information. The expected result is Negative.  Fact Sheet for Patients: SugarRoll.be  Fact Sheet for Healthcare Providers: https://www.woods-mathews.com/  This test is not yet approved or cleared by the [place] FDA and  has been authorized for detection and/or diagnosis of SARS-CoV-2 by FDA under an Emergency Use Authorization (EUA). This EUA will remain  in effect (meaning this test can be used) for the duration of the COVID-19 declaration under Se ction 564(b)(1) of the Act, 21 U.S.C. section 360bbb-3(b)(1), unless the authorization is terminated or revoked sooner.  Performed at [facility], [place] [address] [place], [place] [address]   Urine Culture     Status: Abnormal   Collection Time: 08/06/20  8:44 PM   Specimen: Urine, Random  Result Value Ref Range Status   Specimen Description   Final    URINE, RANDOM Performed at [facility], [address] [place], [place] [address]    Special Requests   Final    NONE Performed at [facility], [address], [place],  [address]    Culture >=100,000 COLONIES/mL KLEBSIELLA OXYTOCA (A)  Final    Report Status 08/09/2020 FINAL  Final   Organism ID, Bacteria KLEBSIELLA OXYTOCA (A)  Final      Susceptibility   Klebsiella oxytoca - MIC*    AMPICILLIN >=32 RESISTANT Resistant     CEFAZOLIN 16 SENSITIVE Sensitive     CEFEPIME <=0.12 SENSITIVE Sensitive     CEFTRIAXONE <=0.25 SENSITIVE Sensitive     CIPROFLOXACIN <=0.25 SENSITIVE Sensitive     GENTAMICIN <=1 SENSITIVE Sensitive     IMIPENEM <=0.25 SENSITIVE Sensitive     NITROFURANTOIN 64 INTERMEDIATE Intermediate     TRIMETH/SULFA <=20 SENSITIVE Sensitive     AMPICILLIN/SULBACTAM 8 SENSITIVE Sensitive     PIP/TAZO <=4 SENSITIVE Sensitive     * >=100,000 COLONIES/mL KLEBSIELLA OXYTOCA    Labs: BNP (last 3 results) No results for input(s): BNP in the last 8760 hours. Basic Metabolic Panel: Recent Labs  Lab 08/06/20 1925 08/07/20 0340 08/08/20 0622 08/09/20 0554  NA 131* 134* 134* 135  K 4.3 4.3 5.0 4.4  CL 100 103 102 100  CO2 23 24 24 26   GLUCOSE 129* 220* 335* 183*  BUN 28* 25* 25* 35*  CREATININE 1.76* 1.52* 1.29* 1.48*  CALCIUM 8.9 8.3* 9.2 9.1  MG  --  1.6* 2.0 1.8  PHOS  --  2.6 2.4* 4.3   Liver Function Tests: Recent Labs  Lab 08/06/20 1925 08/07/20 0340 08/08/20 0622 08/09/20 0554  AST 11* 17 12* 12*  ALT 12 10 11 12   ALKPHOS 71 61 67 60  BILITOT 0.7 0.6 0.4 0.5  PROT 7.0 6.0* 6.1* 6.0*  ALBUMIN 3.6 2.9* 2.8* 2.9*   No results for input(s): LIPASE, AMYLASE in the last 168 hours. No results for input(s): AMMONIA in the last 168 hours. CBC: Recent Labs  Lab 08/06/20 1925 08/07/20 0340 08/08/20 0622 08/09/20 0554  WBC 13.9* 12.5* 11.9* 10.2  NEUTROABS 10.8*  --  10.5* 8.1*  HGB  11.7* 10.7* 11.0* 10.4*  HCT 36.5* 31.9* 33.1* 31.8*  MCV 94.1 91.7 91.9 92.4  PLT 208 196 212 235   Cardiac Enzymes: No results for input(s): CKTOTAL, CKMB, CKMBINDEX, TROPONINI in the last 168 hours. BNP: Invalid input(s): POCBNP CBG: Recent Labs  Lab 08/07/20 1155  GLUCAP 133*   D-Dimer No results for  input(s): DDIMER in the last 72 hours. Hgb A1c No results for input(s): HGBA1C in the last 72 hours. Lipid Profile No results for input(s): CHOL, HDL, LDLCALC, TRIG, CHOLHDL, LDLDIRECT in the last 72 hours. Thyroid function studies No results for input(s): TSH, T4TOTAL, T3FREE, THYROIDAB in the last 72 hours.  Invalid input(s): FREET3 Anemia work up No results for input(s): VITAMINB12, FOLATE, FERRITIN, TIBC, IRON, RETICCTPCT in the last 72 hours. Urinalysis    Component Value Date/Time   COLORURINE YELLOW 08/06/2020 2044   APPEARANCEUR HAZY (A) 08/06/2020 2044   APPEARANCEUR Clear 07/09/2020 1354   LABSPEC 1.017 08/06/2020 2044   PHURINE 5.0 08/06/2020 2044   GLUCOSEU NEGATIVE 08/06/2020 2044   HGBUR LARGE (A) 08/06/2020 2044   BILIRUBINUR NEGATIVE 08/06/2020 2044   BILIRUBINUR Negative 07/09/2020 [place] 08/06/2020 2044   PROTEINUR 30 (A) 08/06/2020 2044   NITRITE POSITIVE (A) 08/06/2020 2044   LEUKOCYTESUR MODERATE (A) 08/06/2020 2044   Sepsis Labs Invalid input(s): PROCALCITONIN,  WBC,  LACTICIDVEN Microbiology Recent Results (from the past 240 hour(s))  SARS CORONAVIRUS 2 (TAT 6-24 HRS) Nasopharyngeal Nasopharyngeal Swab     Status: None   Collection Time: 08/05/20  9:26 AM   Specimen: Nasopharyngeal Swab  Result Value Ref Range Status   SARS Coronavirus 2 NEGATIVE NEGATIVE Final    Comment: (NOTE) SARS-CoV-2 target nucleic acids are NOT DETECTED.  The SARS-CoV-2 RNA is generally detectable in upper and lower respiratory specimens during the acute phase of infection. Negative results do not preclude SARS-CoV-2 infection, do not rule out co-infections with other pathogens, and should not be used as the sole basis for treatment or other patient management decisions. Negative results must be combined with clinical observations, patient history, and epidemiological information. The expected result is Negative.  Fact Sheet for  Patients: SugarRoll.be  Fact Sheet for Healthcare Providers: https://www.woods-mathews.com/  This test is not yet approved or cleared by the [place] FDA and  has been authorized for detection and/or diagnosis of SARS-CoV-2 by FDA under an Emergency Use Authorization (EUA). This EUA will remain  in effect (meaning this test can be used) for the duration of the COVID-19 declaration under Se ction 564(b)(1) of the Act, 21 U.S.C. section 360bbb-3(b)(1), unless the authorization is terminated or revoked sooner.  Performed at [facility], [place] [address] [place], [place] [address]   Urine Culture     Status: Abnormal   Collection Time: 08/06/20  8:44 PM   Specimen: Urine, Random  Result Value Ref Range Status   Specimen Description   Final    URINE, RANDOM Performed at [facility], [address] [place], [place] [address]    Special Requests   Final    NONE Performed at [facility], [address], [place], [place] [address]    Culture >=100,000 COLONIES/mL KLEBSIELLA OXYTOCA (A)  Final   Report Status 08/09/2020 FINAL  Final   Organism ID, Bacteria KLEBSIELLA OXYTOCA (A)  Final      Susceptibility   Klebsiella oxytoca - MIC*    AMPICILLIN >=32 RESISTANT Resistant     CEFAZOLIN 16 SENSITIVE Sensitive  CEFEPIME <=0.12 SENSITIVE Sensitive     CEFTRIAXONE <=0.25 SENSITIVE Sensitive     CIPROFLOXACIN <=0.25 SENSITIVE Sensitive     GENTAMICIN <=1 SENSITIVE Sensitive     IMIPENEM <=0.25 SENSITIVE Sensitive     NITROFURANTOIN 64 INTERMEDIATE Intermediate     TRIMETH/SULFA <=20 SENSITIVE Sensitive     AMPICILLIN/SULBACTAM 8 SENSITIVE Sensitive     PIP/TAZO <=4 SENSITIVE Sensitive     * >=100,000 COLONIES/mL KLEBSIELLA OXYTOCA   Time coordinating discharge: 35 minutes  SIGNED:  Kerney Elbe, DO [facility] 08/09/2020, 4:19 PM Pager is on AMION  If 7PM-7AM, please contact night-coverage [email or web address]

## 2020-08-11 ENCOUNTER — Telehealth: Payer: Self-pay

## 2020-08-11 NOTE — Telephone Encounter (Signed)
Transition Care Management Follow-up Telephone Call  Date of discharge and from where: 08/09/2020 from [facility]   How have you been since you were released from the hospital? Pt doing well.   Any questions or concerns? No  Items Reviewed:  Did the pt receive and understand the discharge instructions provided? Yes   Medications obtained and verified? Yes   Other? No   Any new allergies since your discharge? No   Dietary orders reviewed? Yes  Do you have support at home? Yes   Home Care and Equipment/Supplies: Were home health services ordered? no If so, what is the name of the agency? NA   Has the agency set up a time to come to the patient's home? not applicable Were any new equipment or medical supplies ordered?  No What is the name of the medical supply agency? NA  Were you able to get the supplies/equipment? not applicable Do you have any questions related to the use of the equipment or supplies? No  Functional Questionnaire: (I = Independent and D = Dependent) ADLs: I  Bathing/Dressing- I  Meal Prep- I  Eating- I  Maintaining continence- I  Transferring/Ambulation- I  Managing Meds- I  Follow up appointments reviewed:   PCP Hospital f/u appt confirmed? Yes  Scheduled to see Dr. [name] on Aug 29, 2020 @ 8:20am   [facility] f/u appt confirmed? Yes  Scheduled to see Dr. Alyson Ingles on 08/20/2020 @ 1:00pm.  Are transportation arrangements needed? No   If their condition worsens, is the pt aware to call PCP or go to the Emergency Dept.? Yes  Was the patient provided with contact information for the PCP's office or ED? Yes  Was to pt encouraged to call back with questions or concerns? Yes

## 2020-08-11 NOTE — Progress Notes (Deleted)
CARDIOLOGY CONSULT NOTE       Patient ID: Cristian [facility] MRN: [id number] DOB/AGE: [birth year] 75 y.o.  Admit date: (Not on file) Referring Physician: Posey [facility] Primary Physician: Cristian Spar, MD Primary Cardiologist: [facility] Reason for Consultation: PAF   HPI: 75 y.o. originally from [place]. History of renal transplant on mycophenolate and cyclosporine. PTSD on lexapro.HLD intolerant to statins Rx with zetia. Followed in [place] by [facility] History of PAF and bradycardia. [facility] lmyovue 09/24/18 EF 67% Baseline Cr 2.6 TTE done here 06/05/20 with normal EF mild/moderate AR Monitor 08/06/20 showed frequent bouts of PAF that were symptomatic Referred to EP.   June 2020 had CHF with afib CHADVASC 2.  Has had junctional rhy thm with hyperkalemia with his CRF.    Remarried x 6 years has a daughter in [place] area who is a Psychologist, clinical and a daughter in [place] 4 grand kids near by. Retired Development worker, international aid  Admitted with [facility] UTI and hydronephrosis Rx IV antibiotics and had failed attempt at uretal stent placement improved clinically and did not go for nephrostomy tube No hematuria D/c Cr 1.48   ***   ROS All other systems reviewed and negative except as noted above  Past Medical History:  Diagnosis Date  . Acid reflux   . Anxiety   . Arthritis   . Atrial fibrillation ([facility])   . Depression   . Dysrhythmia    AFIB  . Gout   . High cholesterol   . Hypertension   . Kidney failure   . PTSD (post-traumatic stress disorder)     Family History  Problem Relation Age of Onset  . Alzheimer's disease Mother   . Diabetes Mother     Social History   Socioeconomic History  . Marital status: Married    Spouse name: Not on file  . Number of children: Not on file  . Years of education: Not on file  . Highest education level: Not on file  Occupational History  . Not on file  Tobacco Use  . Smoking status: Never Smoker  . Smokeless tobacco: Never Used  Vaping Use  . Vaping Use:  Never used  Substance and Sexual Activity  . Alcohol use: Not Currently  . Drug use: Never  . Sexual activity: Not Currently  Other Topics Concern  . Not on file  Social History Narrative  . Not on file   Social Determinants of Health   Financial Resource Strain: Low Risk   . Difficulty of Paying Living Expenses: Not hard at all  Food Insecurity: No Food Insecurity  . Worried About Charity fundraiser in the Last Year: Never true  . Ran Out of Food in the Last Year: Never true  Transportation Needs: No Transportation Needs  . Lack of Transportation (Medical): No  . Lack of Transportation (Non-Medical): No  Physical Activity: Insufficiently Active  . Days of Exercise per Week: 3 days  . Minutes of Exercise per Session: 30 min  Stress: No Stress Concern Present  . Feeling of Stress : Not at all  Social Connections: Moderately Isolated  . Frequency of Communication with Friends and Family: More than three times a week  . Frequency of Social Gatherings with Friends and Family: Once a week  . Attends Religious Services: Never  . Active Member of Clubs or Organizations: No  . Attends Archivist Meetings: Never  . Marital Status: Married  Human resources officer Violence: Not At Risk  . Fear of Current or Ex-Partner:  No  . Emotionally Abused: No  . Physically Abused: No  . Sexually Abused: No    Past Surgical History:  Procedure Laterality Date  . CYSTOSCOPY  08/07/2020   Procedure: CYSTOSCOPY, INSERTION OF FOLEY CATHETER;  Surgeon: Cristian Gustin, MD;  Location: [facility];  Service: Urology;;  . KIDNEY TRANSPLANT Right 2013  . REPLACEMENT TOTAL KNEE Right   . SKIN CANCER EXCISION    . TOTAL SHOULDER REPLACEMENT Left       Current Outpatient Medications:  .  acetaminophen (TYLENOL) 500 MG tablet, Take 1,000 mg by mouth every 6 (six) hours as needed. (Patient not taking: Reported on 08/07/2020), Disp: , Rfl:  .  allopurinol (ZYLOPRIM) 100 MG tablet, Take 50 mg by mouth  daily., Disp: , Rfl:  .  ALPRAZolam (XANAX) 0.25 MG tablet, Take 0.25 mg by mouth daily as needed., Disp: , Rfl:  .  cephALEXin (KEFLEX) 500 MG capsule, Take 1 capsule (500 mg total) by mouth 3 (three) times daily for 7 days., Disp: 21 capsule, Rfl: 0 .  cholecalciferol (VITAMIN D3) 25 MCG (1000 UNIT) tablet, Take 1,000 Units by mouth in the morning and at bedtime., Disp: , Rfl:  .  cycloSPORINE modified (NEORAL) 25 MG capsule, Take 75 mg by mouth 2 (two) times daily. Take 3 tablets PO BID, Disp: , Rfl:  .  diltiazem (DILACOR XR) 240 MG 24 hr capsule, Take 240 mg by mouth daily., Disp: , Rfl:  .  escitalopram (LEXAPRO) 10 MG tablet, Take 1 tablet (10 mg total) by mouth daily., Disp: 30 tablet, Rfl: 3 .  ezetimibe (ZETIA) 10 MG tablet, Take 1 tablet (10 mg total) by mouth daily., Disp: 90 tablet, Rfl: 1 .  hydrALAZINE (APRESOLINE) 25 MG tablet, Take 1 tablet (25 mg total) by mouth 3 (three) times daily. (Patient not taking: Reported on 07/28/2020), Disp: 270 tablet, Rfl: 2 .  mycophenolate (MYFORTIC) 180 MG EC tablet, Take 180 mg by mouth 2 (two) times daily., Disp: , Rfl:  .  Omega-3 Fatty Acids (FISH OIL) 1200 MG CAPS, Take 2,400 mg by mouth in the morning and at bedtime., Disp: , Rfl:  .  pantoprazole (PROTONIX) 40 MG tablet, Take 40 mg by mouth daily., Disp: , Rfl:  .  Rivaroxaban (XARELTO) 15 MG TABS tablet, Take 15 mg by mouth daily with breakfast., Disp: , Rfl:  .  sodium bicarbonate 650 MG tablet, Take 650 mg by mouth 2 (two) times daily., Disp: , Rfl:  .  tamsulosin (FLOMAX) 0.4 MG CAPS capsule, Take 0.4 mg by mouth daily., Disp: , Rfl:  .  UNABLE TO FIND, Vitamin D 10 mcg. Take 1 tablet PO BID., Disp: , Rfl:     Physical Exam: There were no vitals taken for this visit.    Affect appropriate Healthy:  appears stated age 75: normal Neck supple with no adenopathy JVP normal no bruits no thyromegaly Lungs clear with no wheezing and good diaphragmatic motion Heart:  S1/S2 no murmur,  no rub, gallop or click PMI normal Abdomen: benighn, BS positve, no tenderness, no AAA no bruit.  No HSM or HJR post transplant  Distal pulses intact with no bruits No edema Neuro non-focal Skin warm and dry No muscular weakness    Radiology: DG Chest 2 View  Result Date: 08/06/2020 CLINICAL DATA:  Fever EXAM: CHEST - 2 VIEW COMPARISON:  06/29/2020 FINDINGS: Lungs are well expanded, symmetric, and clear. No pneumothorax or pleural effusion. Cardiac size within normal limits. Pulmonary vascularity is  normal. Osseous structures are age-appropriate. Left total shoulder arthroplasty has been performed. No acute bone abnormality. IMPRESSION: No active cardiopulmonary disease. Electronically Signed   By: Fidela Salisbury MD   On: 08/06/2020 19:33   US RENAL  Result Date: 08/08/2020 CLINICAL DATA:  Hydronephrosis of transplant kidney. Ureteral orifice could not be cannulated for retrograde stent placement cystoscopically. EXAM: RENAL / URINARY TRACT ULTRASOUND COMPLETE COMPARISON:  07/28/2020 CT FINDINGS: Transplant kidney: Location: Right iliac fossa. Renal measurements: 11.5 x 7.8 x 5.4 = volume: 254 mL. Echogenicity within normal limits. No mass visualized. There is moderate hydronephrosis. Right Kidney: Renal measurements: 9 x 4.8 x 4.4 = volume: 97 mL. Echogenic renal parenchyma. No hydronephrosis. 2 subcentimeter cystic appearing lesions in the lower pole. Left Kidney: Renal measurements: 9.7 x 7.3 x 6.6 = volume: 244 mL. Echogenic parenchyma. No hydronephrosis. 6.4 cm cystic lesion in the lower pole. Bladder: Decompressed by Foley catheter. Other: None. IMPRESSION: 1. Moderate hydronephrosis of the transplant kidney in the right iliac fossa. 2. Bilateral native renal atrophy with cystic lesions, no hydronephrosis. Electronically Signed   By: Lucrezia Europe M.D.   On: 08/08/2020 09:51   CARDIAC EVENT MONITOR  Result Date: 08/06/2020 Frequent bouts of rapid PAF/Flutter/Fib that Correlate with symptoms of  dyspnea and palpitations Refer to EP for further Rx  DG C-Arm 1-60 Min-No Report  Result Date: 08/07/2020 Fluoroscopy was utilized by the requesting physician.  No radiographic interpretation.   CT Renal Stone Study  Result Date: 07/28/2020 CLINICAL DATA:  Flank pain, kidney stone suspected Patient reports he is unable to void. Renal transplant 2013. Right lower quadrant and left upper abdominal pain today. EXAM: CT ABDOMEN AND PELVIS WITHOUT CONTRAST TECHNIQUE: Multidetector CT imaging of the abdomen and pelvis was performed following the standard protocol without IV contrast. COMPARISON:  Renal transplant ultrasound 11/26/2020 FINDINGS: Lower chest: Upper normal heart size. There are coronary artery calcifications. No pleural fluid or acute airspace disease. Hepatobiliary: Borderline hepatic steatosis. No discrete focal hepatic lesion on noncontrast exam. Decompressed gallbladder. No calcified gallstone. No biliary dilatation. Pancreas: No ductal dilatation or inflammation. Spleen: Normal in size without focal abnormality. Adrenals/Urinary Tract: Normal adrenal glands. Marked bilateral native renal atrophy. Low-density lesions arising from both native kidneys measure or simple fluid density and are consistent with cysts. Largest arises from the posterior mid left kidney measures 6.5 cm. No hydronephrosis or ureteral dilatation. Right lower quadrant renal transplant. Mild transplant hydronephrosis but no ureteral dilatation. Minor transplant perinephric edema. No transplant calculi. Possible cyst in the lower pole of the renal transplant, but not well assessed on this noncontrast exam. The urinary bladder is partially distended, no bladder wall thickening or stone. Stomach/Bowel: Unremarkable stomach. Small bowel primarily decompressed. No obstruction or inflammation. Normal appendix. Moderate colonic stool burden. No colonic wall thickening or inflammation. Occasional left colonic diverticula without  diverticulitis. Vascular/Lymphatic: Moderate aortic and branch atherosclerosis. No aortic aneurysm no abdominopelvic adenopathy. Reproductive: Enlarged spanning 5.5 cm transverse. Other: Postsurgical change of the anterior abdominal wall. Tiny fat containing umbilical hernia. Minimal fat in both inguinal canals. No ascites, free air, or abdominopelvic fluid collection. Musculoskeletal: Degenerative disc disease at L4-L5 and L5-S1. There are no acute or suspicious osseous abnormalities. IMPRESSION: 1. Right lower quadrant renal transplant. Mild transplant hydronephrosis but no urolithiasis or cause for obstruction. Minor transplant perinephric edema. The degree of hydronephrosis appears improved from ultrasound earlier this year. 2. Marked bilateral native renal atrophy with bilateral renal cysts. 3. Enlarged prostate spanning 5.5 cm. Aortic Atherosclerosis (  ICD10-I70.0). Electronically Signed   By: Keith Rake M.D.   On: 07/28/2020 15:58    EKG: NSR rate 56 noraml    ASSESSMENT AND PLAN:   1. PAF: on xarelto tends toward bradycardia Junctional when hyperkalemic Monitor with frequent bouts of PAF symptomatic. Given CRF/SSS referred to EP to consider ablation rather than AAT LA normal size by TTE 3.1 cm Continue xarelto and cardizem   2. HTN:  Improved meds cut back last visit   3. CRF: post transplant has f/u with nephrology locally Immunosuppressed Cr on recent d/c 1.48 better than usual  4. PTSD:  Started on Lexapro by primary  6. HLD: no history of CAD intolerant to statins on zetia labs with primary  7. Bradycardia:  SSS and PAF monitor with no high grade AV block avoid beta blockers   Refer EP  SSS symptomatic PAF ***  Signed: Jenkins Rouge 08/11/2020, 12:45 PM

## 2020-08-19 ENCOUNTER — Ambulatory Visit: Payer: Medicare Other | Admitting: Cardiovascular Disease

## 2020-08-20 ENCOUNTER — Other Ambulatory Visit: Payer: Self-pay

## 2020-08-20 ENCOUNTER — Ambulatory Visit (INDEPENDENT_AMBULATORY_CARE_PROVIDER_SITE_OTHER): Payer: Medicare Other | Admitting: Urology

## 2020-08-20 ENCOUNTER — Encounter: Payer: Self-pay | Admitting: Urology

## 2020-08-20 VITALS — BP 111/51 | HR 66 | Temp 98.0°F | Ht 65.0 in | Wt 160.0 lb

## 2020-08-20 DIAGNOSIS — N138 Other obstructive and reflux uropathy: Secondary | ICD-10-CM | POA: Diagnosis not present

## 2020-08-20 DIAGNOSIS — N401 Enlarged prostate with lower urinary tract symptoms: Secondary | ICD-10-CM | POA: Diagnosis not present

## 2020-08-20 DIAGNOSIS — R339 Retention of urine, unspecified: Secondary | ICD-10-CM | POA: Diagnosis not present

## 2020-08-20 DIAGNOSIS — N131 Hydronephrosis with ureteral stricture, not elsewhere classified: Secondary | ICD-10-CM | POA: Diagnosis not present

## 2020-08-20 LAB — URINALYSIS, ROUTINE W REFLEX MICROSCOPIC
Bilirubin, UA: NEGATIVE
Glucose, UA: NEGATIVE
Ketones, UA: NEGATIVE
Leukocytes,UA: NEGATIVE
Nitrite, UA: NEGATIVE
Specific Gravity, UA: >1.03 — ABNORMAL HIGH (ref 1.005–1.030)
Urobilinogen, Ur: 1 mg/dL (ref 0.2–1.0)
pH, UA: 6 (ref 5.0–7.5)

## 2020-08-20 LAB — MICROSCOPIC EXAMINATION
Bacteria, UA: NONE SEEN
Epithelial Cells (non renal): NONE SEEN /hpf (ref 0–10)
Renal Epithel, UA: NONE SEEN /hpf

## 2020-08-20 LAB — BLADDER SCAN AMB NON-IMAGING: Scan Result: 8

## 2020-08-20 MED ORDER — ALLOPURINOL 100 MG PO TABS: 50.0000 mg | ORAL_TABLET | Freq: Every day | ORAL | 11 refills | Status: AC

## 2020-08-20 NOTE — Progress Notes (Signed)
08/20/2020 1:20 PM   [facility] [birth year] [id number]  Referring provider: [name], MD [address] [place],  [place] [address]  followup hydronephrosis  HPI: Mr Menna is a 75yo here for followup for hydronephrosis and BPH. He underwent attempted ureteral stent placement 2 weeks ago which was unsuccessful due to a tortuous ureter. His hydronephrosis resolved on folllowup CT. He remains on flomax 0.4mg  daily. PVR minimal. He was started on allopurinol in 07/2020. He is drinking crystal light.     PMH: Past Medical History:  Diagnosis Date  . Acid reflux   . Anxiety   . Arthritis   . Atrial fibrillation ([place])   . Depression   . Dysrhythmia    AFIB  . Gout   . High cholesterol   . Hypertension   . Kidney failure   . PTSD (post-traumatic stress disorder)     Surgical History: Past Surgical History:  Procedure Laterality Date  . CYSTOSCOPY  08/07/2020   Procedure: CYSTOSCOPY, INSERTION OF FOLEY CATHETER;  Surgeon: Cleon Gustin, MD;  Location: [facility];  Service: Urology;;  . KIDNEY TRANSPLANT Right 2013  . REPLACEMENT TOTAL KNEE Right   . SKIN CANCER EXCISION    . TOTAL SHOULDER REPLACEMENT Left     Home Medications:  Allergies as of 08/20/2020      Reactions   Doxycycline Hives   Doxycycline Hyclate Hives   Simvastatin Other (See Comments)   Pt states he loss all use of muscles and he was in the hospital for 10 days       Medication List       Accurate as of Aug 20, 2020  1:20 PM. If you have any questions, ask your nurse or doctor.        acetaminophen 500 MG tablet Commonly known as: TYLENOL Take 1,000 mg by mouth every 6 (six) hours as needed.   allopurinol 100 MG tablet Commonly known as: ZYLOPRIM Take 50 mg by mouth daily.   ALPRAZolam 0.25 MG tablet Commonly known as: XANAX Take 0.25 mg by mouth daily as needed.   cholecalciferol 25 MCG (1000 UNIT) tablet Commonly known as: VITAMIN D3 Take 1,000 Units by mouth in the morning  and at bedtime.   cycloSPORINE modified 25 MG capsule Commonly known as: NEORAL Take 75 mg by mouth 2 (two) times daily. Take 3 tablets PO BID   diltiazem 240 MG 24 hr capsule Commonly known as: DILACOR XR Take 240 mg by mouth daily.   escitalopram 10 MG tablet Commonly known as: Lexapro Take 1 tablet (10 mg total) by mouth daily.   ezetimibe 10 MG tablet Commonly known as: ZETIA Take 1 tablet (10 mg total) by mouth daily.   Fish Oil 1200 MG Caps Take 2,400 mg by mouth in the morning and at bedtime.   hydrALAZINE 25 MG tablet Commonly known as: APRESOLINE Take 1 tablet (25 mg total) by mouth 3 (three) times daily.   mycophenolate 180 MG EC tablet Commonly known as: MYFORTIC Take 180 mg by mouth 2 (two) times daily.   pantoprazole 40 MG tablet Commonly known as: PROTONIX Take 40 mg by mouth daily.   Rivaroxaban 15 MG Tabs tablet Commonly known as: XARELTO Take 15 mg by mouth daily with breakfast.   sodium bicarbonate 650 MG tablet Take 650 mg by mouth 2 (two) times daily.   tamsulosin 0.4 MG Caps capsule Commonly known as: FLOMAX Take 0.4 mg by mouth daily.   UNABLE TO FIND Vitamin  D 10 mcg. Take 1 tablet PO BID.       Allergies:  Allergies  Allergen Reactions  . Doxycycline Hives  . Doxycycline Hyclate Hives  . Simvastatin Other (See Comments)    Pt states he loss all use of muscles and he was in the hospital for 10 days     Family History: Family History  Problem Relation Age of Onset  . Alzheimer's disease Mother   . Diabetes Mother     Social History:  reports that he has never smoked. He has never used smokeless tobacco. He reports previous alcohol use. He reports that he does not use drugs.  ROS: All other review of systems were reviewed and are negative except what is noted above in HPI  Physical Exam: BP (!) 111/51   Pulse 66   Temp 98 F (36.7 C)   Ht 5\' 5"  (1.651 m)   Wt 160 lb (72.6 kg)   BMI 26.63 kg/m   Constitutional:  Alert  and oriented, No acute distress. HEENT: [place] AT, moist mucus membranes.  Trachea midline, no masses. Cardiovascular: No clubbing, cyanosis, or edema. Respiratory: Normal respiratory effort, no increased work of breathing. GI: Abdomen is soft, nontender, nondistended, no abdominal masses GU: No CVA tenderness.  Lymph: No cervical or inguinal lymphadenopathy. Skin: No rashes, bruises or suspicious lesions. Neurologic: Grossly intact, no focal deficits, moving all 4 extremities. Psychiatric: Normal mood and affect.  Laboratory Data: Lab Results  Component Value Date   WBC 10.2 08/09/2020   HGB 10.4 (L) 08/09/2020   HCT 31.8 (L) 08/09/2020   MCV 92.4 08/09/2020   PLT 235 08/09/2020    Lab Results  Component Value Date   CREATININE 1.48 (H) 08/09/2020    No results found for: PSA  No results found for: TESTOSTERONE  No results found for: HGBA1C  Urinalysis    Component Value Date/Time   COLORURINE YELLOW 08/06/2020 2044   APPEARANCEUR HAZY (A) 08/06/2020 2044   APPEARANCEUR Clear 07/09/2020 1354   LABSPEC 1.017 08/06/2020 2044   PHURINE 5.0 08/06/2020 2044   GLUCOSEU NEGATIVE 08/06/2020 2044   HGBUR LARGE (A) 08/06/2020 2044   BILIRUBINUR NEGATIVE 08/06/2020 2044   BILIRUBINUR Negative 07/09/2020 [place] 08/06/2020 2044   PROTEINUR 30 (A) 08/06/2020 2044   NITRITE POSITIVE (A) 08/06/2020 2044   LEUKOCYTESUR MODERATE (A) 08/06/2020 2044    Lab Results  Component Value Date   LABMICR See below: 07/09/2020   WBCUA None seen 07/09/2020   LABEPIT None seen 07/09/2020   MUCUS Present 07/09/2020   BACTERIA MANY (A) 08/06/2020    Pertinent Imaging:  No results found for this or any previous visit.  No results found for this or any previous visit.  No results found for this or any previous visit.  No results found for this or any previous visit.  Results for orders placed during the hospital encounter of 08/06/20  US  RENAL  Narrative CLINICAL DATA:  Hydronephrosis of transplant kidney. Ureteral orifice could not be cannulated for retrograde stent placement cystoscopically.  EXAM: RENAL / URINARY TRACT ULTRASOUND COMPLETE  COMPARISON:  07/28/2020 CT  FINDINGS: Transplant kidney:  Location: Right iliac fossa. Renal measurements: 11.5 x 7.8 x 5.4 = volume: 254 mL. Echogenicity within normal limits. No mass visualized. There is moderate hydronephrosis.  Right Kidney:  Renal measurements: 9 x 4.8 x 4.4 = volume: 97 mL. Echogenic renal parenchyma. No hydronephrosis. 2 subcentimeter cystic appearing lesions in the lower pole.  Left  Kidney:  Renal measurements: 9.7 x 7.3 x 6.6 = volume: 244 mL. Echogenic parenchyma. No hydronephrosis. 6.4 cm cystic lesion in the lower pole.  Bladder:  Decompressed by Foley catheter.  Other:  None.  IMPRESSION: 1. Moderate hydronephrosis of the transplant kidney in the right iliac fossa. 2. Bilateral native renal atrophy with cystic lesions, no hydronephrosis.   Electronically Signed By: Lucrezia Europe M.D. On: 08/08/2020 09:51  No results found for this or any previous visit.  No results found for this or any previous visit.  Results for orders placed during the hospital encounter of 07/28/20  CT Renal Stone Study  Narrative CLINICAL DATA:  Flank pain, kidney stone suspected  Patient reports he is unable to void. Renal transplant 2013. Right lower quadrant and left upper abdominal pain today.  EXAM: CT ABDOMEN AND PELVIS WITHOUT CONTRAST  TECHNIQUE: Multidetector CT imaging of the abdomen and pelvis was performed following the standard protocol without IV contrast.  COMPARISON:  Renal transplant ultrasound 11/26/2020  FINDINGS: Lower chest: Upper normal heart size. There are coronary artery calcifications. No pleural fluid or acute airspace disease.  Hepatobiliary: Borderline hepatic steatosis. No discrete focal hepatic lesion on  noncontrast exam. Decompressed gallbladder. No calcified gallstone. No biliary dilatation.  Pancreas: No ductal dilatation or inflammation.  Spleen: Normal in size without focal abnormality.  Adrenals/Urinary Tract: Normal adrenal glands. Marked bilateral native renal atrophy. Low-density lesions arising from both native kidneys measure or simple fluid density and are consistent with cysts. Largest arises from the posterior mid left kidney measures 6.5 cm. No hydronephrosis or ureteral dilatation.  Right lower quadrant renal transplant. Mild transplant hydronephrosis but no ureteral dilatation. Minor transplant perinephric edema. No transplant calculi. Possible cyst in the lower pole of the renal transplant, but not well assessed on this noncontrast exam. The urinary bladder is partially distended, no bladder wall thickening or stone.  Stomach/Bowel: Unremarkable stomach. Small bowel primarily decompressed. No obstruction or inflammation. Normal appendix. Moderate colonic stool burden. No colonic wall thickening or inflammation. Occasional left colonic diverticula without diverticulitis.  Vascular/Lymphatic: Moderate aortic and branch atherosclerosis. No aortic aneurysm no abdominopelvic adenopathy.  Reproductive: Enlarged spanning 5.5 cm transverse.  Other: Postsurgical change of the anterior abdominal wall. Tiny fat containing umbilical hernia. Minimal fat in both inguinal canals. No ascites, free air, or abdominopelvic fluid collection.  Musculoskeletal: Degenerative disc disease at L4-L5 and L5-S1. There are no acute or suspicious osseous abnormalities.  IMPRESSION: 1. Right lower quadrant renal transplant. Mild transplant hydronephrosis but no urolithiasis or cause for obstruction. Minor transplant perinephric edema. The degree of hydronephrosis appears improved from ultrasound earlier this year. 2. Marked bilateral native renal atrophy with bilateral renal cysts. 3.  Enlarged prostate spanning 5.5 cm.  Aortic Atherosclerosis (ICD10-I70.0).   Electronically Signed By: Keith Rake M.D. On: 07/28/2020 15:58   Assessment & Plan:    1. Hydronephrosis with ureteral stricture, not elsewhere classified -RTC 3 months with renal US - Urinalysis, Routine w reflex microscopic  2. Benign prostatic hyperplasia with urinary obstruction -COntinue flomax 0.4mg  daily  3. Incomplete emptying of bladder Continue flomax 0.62mng daily - BLADDER SCAN AMB NON-IMAGING   No follow-ups on file.  Nicolette Bang, MD  [facility] [place]

## 2020-08-20 NOTE — Patient Instructions (Signed)
Hydronephrosis  Hydronephrosis is the swelling of one or both kidneys due to a blockage that stops urine from flowing out of the body. Kidneys filter waste from the blood and produce urine. This condition can lead to kidney failure and may become life-threatening if not treated promptly. What are the causes? In infants and children, common causes include problems that occur when a baby is developing in the womb. These can include problems in the kidneys or in the tubes that drain urine into the bladder (ureters). In adults, common causes include:  Kidney stones.  Pregnancy.  A tumor or cyst in the abdomen or pelvis.  An enlarged prostate gland. Other causes include:  Bladder infection.  Scar tissue from a previous surgery or injury.  A blood clot.  Cancer of the prostate, bladder, uterus, ovary, or colon. What are the signs or symptoms? Symptoms of this condition include:  Pain or discomfort in your side (flank) or abdomen.  Swelling in your abdomen.  Nausea and vomiting.  Fever.  Pain when passing urine.  Feelings of urgency when you need to urinate.  Urinating more often than normal. In some cases, you may not have any symptoms. How is this diagnosed? This condition may be diagnosed based on:  Your symptoms and medical history.  A physical exam.  Blood and urine tests.  Imaging tests, such as an ultrasound, CT scan, or MRI.  A procedure to look at your urinary tract and bladder by inserting a scope into the urethra (cystoscopy). How is this treated? Treatment for this condition depends on where the blockage is, how long it has been there, and what caused it. The goal of treatment is to remove the blockage. Treatment may include:  Antibiotic medicines to treat or prevent infection.  A procedure to place a small, thin tube (stent) into a blocked ureter. The stent will keep the ureter open so that urine can drain through it.  A nonsurgical procedure that  crushes kidney stones with shock waves (extracorporeal shock wave lithotripsy).  If kidney failure occurs, treatment may include dialysis or a kidney transplant. Follow these instructions at home:  Take over-the-counter and prescription medicines only as told by your health care provider.  If you were prescribed an antibiotic medicine, take it exactly as told by your health care provider. Do not stop taking the antibiotic even if you start to feel better.  Rest and return to your normal activities as told by your health care provider. Ask your health care provider what activities are safe for you.  Drink enough fluid to keep your urine pale yellow.  Keep all follow-up visits. This is important.   Contact a health care provider if:  You continue to have symptoms after treatment.  You develop new symptoms.  Your urine becomes cloudy or bloody.  You have a fever. Get help right away if:  You have severe flank or abdominal pain.  You cannot drink fluids without vomiting. Summary  Hydronephrosis is the swelling of one or both kidneys due to a blockage that stops urine from flowing out of the body.  Hydronephrosis can lead to kidney failure and may become life-threatening if not treated promptly.  The goal of treatment is to remove the blockage. It may include a procedure to insert a stent into a blocked ureter, a procedure to break up kidney stones, or taking antibiotic medicines.  Follow your health care provider's instructions for taking care of yourself at home, including instructions about drinking fluids, taking   medicines, and limiting activities. This information is not intended to replace advice given to you by your health care provider. Make sure you discuss any questions you have with your health care provider. Document Revised: 07/10/2019 Document Reviewed: 07/10/2019 Elsevier Patient Education  2021 [employer]vier Inc.  

## 2020-08-20 NOTE — Progress Notes (Signed)
post void residual=8  Urological Symptom Review  Patient is experiencing the following symptoms: Get up at night to urinate Erection problems (male only)   Review of Systems  Gastrointestinal (upper)  : Negative for upper GI symptoms  Gastrointestinal (lower) : Negative for lower GI symptoms  Constitutional : Negative for symptoms  Skin: Negative for skin symptoms  Eyes: Negative for eye symptoms  Ear/Nose/Throat : Negative for Ear/Nose/Throat symptoms  Hematologic/Lymphatic: Bruise/bleed easily  Cardiovascular : Leg swelling Chest pain  Respiratory : Negative for respiratory symptoms  Endocrine: Negative for endocrine symptoms  Musculoskeletal: Back pain  Neurological: Negative for neurological symptoms  Psychologic: Depression Anxiety

## 2020-08-25 ENCOUNTER — Ambulatory Visit (INDEPENDENT_AMBULATORY_CARE_PROVIDER_SITE_OTHER): Payer: Medicare Other | Admitting: Cardiology

## 2020-08-25 ENCOUNTER — Encounter: Payer: Self-pay | Admitting: Cardiology

## 2020-08-25 ENCOUNTER — Other Ambulatory Visit: Payer: Self-pay

## 2020-08-25 VITALS — BP 118/76 | HR 67 | Ht 65.0 in | Wt 163.0 lb

## 2020-08-25 DIAGNOSIS — Z01818 Encounter for other preprocedural examination: Secondary | ICD-10-CM

## 2020-08-25 DIAGNOSIS — I480 Paroxysmal atrial fibrillation: Secondary | ICD-10-CM | POA: Diagnosis not present

## 2020-08-25 DIAGNOSIS — Z01812 Encounter for preprocedural laboratory examination: Secondary | ICD-10-CM

## 2020-08-25 NOTE — Patient Instructions (Addendum)
Medication Instructions:  Your physician recommends that you continue on your current medications as directed. Please refer to the Current Medication list given to you today.  *If you need a refill on your cardiac medications before your next appointment, please call your pharmacy*   Lab Work: Please have lab work on 09/26/2020 You do NOT need to be fasting.  You can stop by any [facility] office for this blood work. If you have labs (blood work) drawn today and your tests are completely normal, you will receive your results only by: Marland Kitchen MyChart Message (if you have MyChart) OR . A paper copy in the mail If you have any lab test that is abnormal or we need to change your treatment, we will call you to review the results.   Testing/Procedures: Your physician has requested that you have a TEE prior to ablation. During a TEE, sound waves are used to create images of your heart. It provides your doctor with information about the size and shape of your heart and how well your heart's chambers and valves are working. In this test, a transducer is attached to the end of a flexible tube that's guided down your throat and into your esophagus (the tube leading from you mouth to your stomach) to get a more detailed image of your heart. You are not awake for the procedure.  Please follow instruction below located under "other instructions".   Your physician has recommended that you have an ablation. Catheter ablation is a medical procedure used to treat some cardiac arrhythmias (irregular heartbeats). During catheter ablation, a long, thin, flexible tube is put into a blood vessel in your groin (upper thigh), or neck. This tube is called an ablation catheter. It is then guided to your heart through the blood vessel. Radio frequency waves destroy small areas of heart tissue where abnormal heartbeats may cause an arrhythmia to start. Please follow instruction below located under "other  instructions".   Follow-Up: At [facility], you and your health needs are our priority.  As part of our continuing mission to provide you with exceptional heart care, we have created designated Provider Care Teams.  These Care Teams include your primary Cardiologist (physician) and Advanced Practice Providers (APPs -  Physician Assistants and Nurse Practitioners) who all work together to provide you with the care you need, when you need it.   Your next appointment:   1 month(s) after your ablation  The format for your next appointment:   In Person  Provider:   AFib clinic   Thank you for choosing [facility]!!   Trinidad Curet, RN [phone]    Other Instructions  TEE You are scheduled for a TEE on 10/03/2020 with O'Neal.  Please arrive at the [facility] (Main Entrance A) at [facility]: [address] [place], [place] [address] at 6:30 am.  DIET: Nothing to eat or drink after midnight except a sip of water with medications (see medication instructions below)  Medication Instructions: Hold all medications the morning of this procedure  Continue your anticoagulant: Xarelto You will need to continue your anticoagulant after your procedure until you are told by your [rovider that it is safe to stop   Labs: 09/26/20  You must have a responsible person to drive you home and stay in the waiting area during your procedure. Failure to do so could result in cancellation.  Bring your insurance cards.  *Special Note: Every effort is made to have your procedure done on time.  Occasionally there are emergencies that occur at the hospital that may cause delays. Please be patient if a delay does occur.       Electrophysiology/Ablation Procedure Instructions   You are scheduled for a(n)  ablation on 10/03/2020 with Dr. Allegra Lai.   1.   Pre procedure testing-             A.  LAB WORK --- On 09/26/2020  for your pre procedure blood work.  You do NOT need to be  fasting.  You can stop by any [facility]   2. On the day of your procedure 10/03/2020 you will go to [facility] [phone]) at 6:30 am (if your TEE is cancelled, then you will arrive at 8:30 am on day of ablation).  You will go to the main entrance A [employer]) and enter where the [employer] parking staff are.  Your driver will drop you off and you will head down the hallway to ADMITTING.  You may have one support person come in to the hospital with you.  They will be asked to wait in the waiting room. It is OK to have someone drop you off and come back when you are ready to be discharged.   3.   Do not eat or drink after midnight prior to your procedure.   4.   On the morning of your procedure do NOT take any medication. Do not miss any doses of your blood thinner prior to the morning of your procedure or your procedure will need to be rescheduled.   5.  Plan for an overnight stay but you may be discharged after your procedure, if you use your phone frequently bring your phone charger. If you are discharged after your procedure you will need someone to drive you home and be with you for 24 hours after your procedure.   6. You will follow up with the AFIB clinic 4 weeks after your procedure.  You will follow up with Dr. Curt Bears  3 months after your procedure.  These appointments will be made for you.   * If you have ANY questions please call the office [phone] and ask for Konica Stankowski RN or send me a MyChart message   * Occasionally, EP Studies and ablations can become lengthy.  Please make your family aware of this before your procedure starts.  Average time ranges from 2-8 hours for EP studies/ablations.  Your physician will call your family after the procedure with the results.                                    AFIB CLINIC INFORMATION: Your appointment is scheduled on: ___________ at ____________. Please arrive 15 minutes early for check-in. The AFib Clinic is located in the  Heart and Vascular Specialty Clinics at [facility]. Parking instructions/directions: [address] (off [address]). When you pull in to Entrance C, there is an underground parking garage to your right. The code to enter the garage is _______________. Take the elevators to the first floor. Follow the signs to the Heart and Vascular Specialty Clinics. You will see registration at the end of the hallway.  Phone number: [phone]    Cardiac Ablation Cardiac ablation is a procedure to destroy (ablate) some heart tissue that is sending bad signals. These bad signals cause problems in heart rhythm. The heart has many areas that make  these signals. If there are problems in these areas, they can make the heart beat in a way that is not normal. Destroying some tissues can help make the heart rhythm normal. Tell your doctor about:  Any allergies you have.  All medicines you are taking. These include vitamins, herbs, eye drops, creams, and over-the-counter medicines.  Any problems you or family members have had with medicines that make you fall asleep (anesthetics).  Any blood disorders you have.  Any surgeries you have had.  Any medical conditions you have, such as kidney failure.  Whether you are pregnant or may be pregnant. What are the risks? This is a safe procedure. But problems may occur, including:  Infection.  Bruising and bleeding.  Bleeding into the chest.  Stroke or blood clots.  Damage to nearby areas of your body.  Allergies to medicines or dyes.  The need for a pacemaker if the normal system is damaged.  Failure of the procedure to treat the problem. What happens before the procedure? Medicines Ask your doctor about:  Changing or stopping your normal medicines. This is important.  Taking aspirin and ibuprofen. Do not take these medicines unless your doctor tells you to take them.  Taking other medicines, vitamins, herbs, and supplements. General  instructions  Follow instructions from your doctor about what you cannot eat or drink.  Plan to have someone take you home from the hospital or clinic.  If you will be going home right after the procedure, plan to have someone with you for 24 hours.  Ask your doctor what steps will be taken to prevent infection. What happens during the procedure?  An IV tube will be put into one of your veins.  You will be given a medicine to help you relax.  The skin on your neck or groin will be numbed.  A cut (incision) will be made in your neck or groin. A needle will be put through your cut and into a large vein.  A tube (catheter) will be put into the needle. The tube will be moved to your heart.  Dye may be put through the tube. This helps your doctor see your heart.  Small devices (electrodes) on the tube will send out signals.  A type of energy will be used to destroy some heart tissue.  The tube will be taken out.  Pressure will be held on your cut. This helps stop bleeding.  A bandage will be put over your cut. The exact procedure may vary among doctors and hospitals.   What happens after the procedure?  You will be watched until you leave the hospital or clinic. This includes checking your heart rate, breathing rate, oxygen, and blood pressure.  Your cut will be watched for bleeding. You will need to lie still for a few hours.  Do not drive for 24 hours or as long as your doctor tells you. Summary  Cardiac ablation is a procedure to destroy some heart tissue. This is done to treat heart rhythm problems.  Tell your doctor about any medical conditions you may have. Tell him or her about all medicines you are taking to treat them.  This is a safe procedure. But problems may occur. These include infection, bruising, bleeding, and damage to nearby areas of your body.  Follow what your doctor tells you about food and drink. You may also be told to change or stop some of your  medicines.  After the procedure, do not drive for 24  hours or as long as your doctor tells you. This information is not intended to replace advice given to you by your health care provider. Make sure you discuss any questions you have with your health care provider. Document Revised: 02/22/2019 Document Reviewed: 02/22/2019 Elsevier Patient Education  2021 [employer].

## 2020-08-25 NOTE — Progress Notes (Signed)
Electrophysiology Office Note   Date:  08/25/2020   ID:  Cristian Henderson, DOB 05-Sep-1945, MRN [id number]  PCP:  [name], MD  Cardiologist:  Johnsie Cancel Primary Electrophysiologist:  Estes Lehner Meredith Leeds, MD    Chief Complaint: AF   History of Present Illness: Cristian Henderson is a 75 y.o. male who is being seen today for the evaluation of AF at the request of Josue Hector, MD. Presenting today for electrophysiology evaluation.  He has a history of atrial fibrillation, hypertension, gout, PTSD.  He is status post renal transplant in 2013.  He was admitted to the hospital June 2020 with heart failure and new onset atrial fibrillation.  He converted to sinus rhythm with Cardizem.  He was readmitted 01/12/2019 with junctional rhythm and hypokalemia as well as worsening renal dysfunction.  Today, he denies symptoms of palpitations, chest pain, shortness of breath, orthopnea, PND, lower extremity edema, claudication, dizziness, presyncope, syncope, bleeding, or neurologic sequela. The patient is tolerating medications without difficulties.  He does get chest pain and shortness of breath when he is in atrial fibrillation.  This was confirmed on cardiac monitor.  He feels well today.  He is usually quite active, able to do all of his daily activities unless he is in atrial fibrillation.   Past Medical History:  Diagnosis Date  . Acid reflux   . Anxiety   . Arthritis   . Atrial fibrillation ([place])   . Depression   . Dysrhythmia    AFIB  . Gout   . High cholesterol   . Hypertension   . Kidney failure   . PTSD (post-traumatic stress disorder)    Past Surgical History:  Procedure Laterality Date  . CYSTOSCOPY  08/07/2020   Procedure: CYSTOSCOPY, INSERTION OF FOLEY CATHETER;  Surgeon: Cleon Gustin, MD;  Location: [facility];  Service: Urology;;  . KIDNEY TRANSPLANT Right 2013  . REPLACEMENT TOTAL KNEE Right   . SKIN CANCER EXCISION    . TOTAL SHOULDER REPLACEMENT Left      Current  Outpatient Medications  Medication Sig Dispense Refill  . acetaminophen (TYLENOL) 500 MG tablet Take 1,000 mg by mouth every 6 (six) hours as needed.    Marland Kitchen allopurinol (ZYLOPRIM) 100 MG tablet Take 0.5 tablets (50 mg total) by mouth daily. 30 tablet 11  . ALPRAZolam (XANAX) 0.25 MG tablet Take 0.25 mg by mouth daily as needed.    . cholecalciferol (VITAMIN D3) 25 MCG (1000 UNIT) tablet Take 1,000 Units by mouth in the morning and at bedtime.    . cycloSPORINE modified (NEORAL) 25 MG capsule Take 75 mg by mouth 3 (three) times daily. Take 3 tablets PO BID    . diltiazem (DILACOR XR) 240 MG 24 hr capsule Take 240 mg by mouth daily.    Marland Kitchen escitalopram (LEXAPRO) 10 MG tablet Take 1 tablet (10 mg total) by mouth daily. 30 tablet 3  . ezetimibe (ZETIA) 10 MG tablet Take 1 tablet (10 mg total) by mouth daily. 90 tablet 1  . mycophenolate (MYFORTIC) 180 MG EC tablet Take 180 mg by mouth 2 (two) times daily.    . Omega-3 Fatty Acids (FISH OIL) 1200 MG CAPS Take 2,400 mg by mouth in the morning and at bedtime.    . pantoprazole (PROTONIX) 40 MG tablet Take 40 mg by mouth daily.    . Rivaroxaban (XARELTO) 15 MG TABS tablet Take 15 mg by mouth daily with breakfast.    . sodium bicarbonate 650 MG tablet Take  650 mg by mouth 2 (two) times daily.    . tamsulosin (FLOMAX) 0.4 MG CAPS capsule Take 0.4 mg by mouth daily.    Marland Kitchen UNABLE TO FIND Vitamin D 10 mcg. Take 1 tablet PO BID.     No current facility-administered medications for this visit.    Allergies:   Doxycycline, Doxycycline hyclate, and Simvastatin   Social History:  The patient  reports that he has never smoked. He has never used smokeless tobacco. He reports previous alcohol use. He reports that he does not use drugs.   Family History:  The patient's family history includes Alzheimer's disease in his mother; Diabetes in his mother.    ROS:  Please see the history of present illness.   Otherwise, review of systems is positive for none.   All other  systems are reviewed and negative.    PHYSICAL EXAM: VS:  BP 118/76   Pulse 67   Ht 5\' 5"  (1.651 m)   Wt 163 lb (73.9 kg)   SpO2 98%   BMI 27.12 kg/m  , BMI Body mass index is 27.12 kg/m. GEN: Well nourished, well developed, in no acute distress  HEENT: normal  Neck: no JVD, carotid bruits, or masses Cardiac: RRR; no murmurs, rubs, or gallops,no edema  Respiratory:  clear to auscultation bilaterally, normal work of breathing GI: soft, nontender, nondistended, + BS MS: no deformity or atrophy  Skin: warm and dry Neuro:  Strength and sensation are intact Psych: euthymic mood, full affect  EKG:  EKG is ordered today. Personal review of the ekg ordered shows rhythm, rate 67  Recent Labs: 08/09/2020: ALT 12; BUN 35; Creatinine, Ser 1.48; Hemoglobin 10.4; Magnesium 1.8; Platelets 235; Potassium 4.4; Sodium 135    Lipid Panel  No results found for: CHOL, TRIG, HDL, CHOLHDL, VLDL, LDLCALC, LDLDIRECT   Wt Readings from Last 3 Encounters:  08/25/20 163 lb (73.9 kg)  08/20/20 160 lb (72.6 kg)  08/06/20 165 lb (74.8 kg)      Other studies Reviewed: Additional studies/ records that were reviewed today include: Cardiac monitor 08/06/2020 personally reviewed Review of the above records today demonstrates:  Frequent bouts of rapid PAF/Flutter/Fib that Correlate with symptoms of dyspnea and palpitations  TTE 06/05/2020 1. Left ventricular ejection fraction, by estimation, is 55 to 60%. The  left ventricle has normal function. The left ventricle has no regional  wall motion abnormalities. There is mild left ventricular hypertrophy.  Left ventricular diastolic parameters  were normal.  2. Right ventricular systolic function is normal. The right ventricular  size is normal.  3. The mitral valve is normal in structure. Trivial mitral valve  regurgitation. No evidence of mitral stenosis.  4. The aortic valve is tricuspid. Aortic valve regurgitation is mild to  moderate. No aortic  stenosis is present.  5. The inferior vena cava is normal in size with greater than 50%  respiratory variability, suggesting right atrial pressure of 3 mmHg.   ASSESSMENT AND PLAN:  1.  Paroxysmal atrial fibrillation: Currently on Xarelto and diltiazem.  CHA2DS2-VASc of 3.  He has had multiple episodes of atrial fibrillation and has been quite symptomatic with chest discomfort, shortness of breath, and fatigue.  He is also had episodes of bradycardia.  It is certainly possible that if we can keep him in normal rhythm, he would be able to avoid pacemaker implant.  I spoke with him about medications versus ablation.  He has had a kidney transplant and is on antirejection medications which could  interact with antiarrhythmics.  We Merline Perkin thus plan for ablation.  Risk, benefits, and alternatives to EP study and radiofrequency ablation for afib were also discussed in detail today. These risks include but are not limited to stroke, bleeding, vascular damage, tamponade, perforation, damage to the esophagus, lungs, and other structures, pulmonary vein stenosis, worsening renal function, and death. The patient understands these risk and wishes to proceed.  We Lilyannah Zuelke therefore proceed with catheter ablation at the next available time.  Carto, ICE, anesthesia are requested for the procedure.  Llewellyn Choplin also obtain CT PV protocol prior to the procedure to exclude LAA thrombus and further evaluate atrial anatomy.   2.  Hypertension: Currently well controlled  3.  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  Continue to monitor.  Case discussed with referring cardiologist  Current medicines are reviewed at length with the patient today.   The patient does not have concerns regarding his medicines.  The following changes were made today:  none  Labs/ tests ordered today include:  Orders Placed This Encounter  Procedures  . Basic metabolic panel  . CBC  . EKG 12-Lead     Disposition:   FU with  Cesar Alf 3 months  Signed, Jacelyn Cuen Meredith Leeds, MD  08/25/2020 11:49 AM     [facility] [address] [place] [address] [phone] (office) [phone] (fax)

## 2020-08-29 ENCOUNTER — Encounter: Payer: Self-pay | Admitting: Internal Medicine

## 2020-08-29 ENCOUNTER — Ambulatory Visit (INDEPENDENT_AMBULATORY_CARE_PROVIDER_SITE_OTHER): Payer: Medicare Other | Admitting: Internal Medicine

## 2020-08-29 ENCOUNTER — Other Ambulatory Visit: Payer: Self-pay

## 2020-08-29 VITALS — BP 128/60 | HR 71 | Temp 97.8°F | Resp 18 | Ht 65.0 in | Wt 163.0 lb

## 2020-08-29 DIAGNOSIS — I480 Paroxysmal atrial fibrillation: Secondary | ICD-10-CM | POA: Diagnosis not present

## 2020-08-29 DIAGNOSIS — Z789 Other specified health status: Secondary | ICD-10-CM

## 2020-08-29 DIAGNOSIS — I10 Essential (primary) hypertension: Secondary | ICD-10-CM | POA: Diagnosis not present

## 2020-08-29 DIAGNOSIS — N1832 Chronic kidney disease, stage 3b: Secondary | ICD-10-CM

## 2020-08-29 NOTE — Assessment & Plan Note (Signed)
Rate-controlled with Diltiazem On Xarelto for [facility] Planned to have cardiac ablation

## 2020-08-29 NOTE — Assessment & Plan Note (Signed)
S/p kidney transplant ?On Cyclosporine, Mycophenolate ?On Sodium bicarb ?F/u with Nephrology ?

## 2020-08-29 NOTE — Patient Instructions (Addendum)
Please continue taking medications as prescribed.  Please consider getting Shingrix vaccines at your local pharmacy.  Please continue to follow low salt diet and ambulate as tolerated.

## 2020-08-29 NOTE — Assessment & Plan Note (Addendum)
BP Readings from Last 1 Encounters:  08/29/20 128/60   Well-controlled with Diltiazem (mainly for A Fib) Counseled for compliance with the medications Advised DASH diet and moderate exercise/walking, at least 150 mins/week

## 2020-08-29 NOTE — Progress Notes (Signed)
Established Patient Office Visit  Subjective:  Patient ID: Cristian Henderson, male    DOB: June 14, 1945  Age: 75 y.o. MRN: [id number]  CC:  Chief Complaint  Patient presents with  . Transitions Of Care    Pt was in hospital for swelling in kidney and kidney blockage has been better since being home still having Afib     HPI Cristian Henderson is a 75 year old male with PMH of kidney transplant, paroxysmal A Fib, HTN, HLD, gout, PTSD and BPH who presents for follow up of his chronic medical conditions.  He was recently hospitalized with AKI, and was found to have hydronephrosis. Ureteral stent placement was unsuccessful, so he was planned to have nephrostomy tube placed with IR. His kidney function and hydronephrosis started improving on the day he was planned to have procedure. It was thought that he had sludge and/or kidney stones in the ureter, which upon clearing could have resulted in improvement of kidney function tests. He has been doing well since being discharged from hospital. Denies dysuria or hematuria.  HTN and A Fib: BP well-controlled. On Diltiazem only currently. He is planned to have cardiac ablation in July.   Past Medical History:  Diagnosis Date  . Acid reflux   . Anxiety   . Arthritis   . Atrial fibrillation ([place])   . Depression   . Dysrhythmia    AFIB  . Gout   . High cholesterol   . Hypertension   . Kidney failure   . PTSD (post-traumatic stress disorder)     Past Surgical History:  Procedure Laterality Date  . CYSTOSCOPY  08/07/2020   Procedure: CYSTOSCOPY, INSERTION OF FOLEY CATHETER;  Surgeon: Cleon Gustin, MD;  Location: [facility];  Service: Urology;;  . KIDNEY TRANSPLANT Right 2013  . REPLACEMENT TOTAL KNEE Right   . SKIN CANCER EXCISION    . TOTAL SHOULDER REPLACEMENT Left     Family History  Problem Relation Age of Onset  . Alzheimer's disease Mother   . Diabetes Mother     Social History   Socioeconomic History  . Marital status: Married     Spouse name: Not on file  . Number of children: Not on file  . Years of education: Not on file  . Highest education level: Not on file  Occupational History  . Not on file  Tobacco Use  . Smoking status: Never Smoker  . Smokeless tobacco: Never Used  Vaping Use  . Vaping Use: Never used  Substance and Sexual Activity  . Alcohol use: Not Currently  . Drug use: Never  . Sexual activity: Not Currently  Other Topics Concern  . Not on file  Social History Narrative  . Not on file   Social Determinants of Health   Financial Resource Strain: Low Risk   . Difficulty of Paying Living Expenses: Not hard at all  Food Insecurity: No Food Insecurity  . Worried About Charity fundraiser in the Last Year: Never true  . Ran Out of Food in the Last Year: Never true  Transportation Needs: No Transportation Needs  . Lack of Transportation (Medical): No  . Lack of Transportation (Non-Medical): No  Physical Activity: Insufficiently Active  . Days of Exercise per Week: 3 days  . Minutes of Exercise per Session: 30 min  Stress: No Stress Concern Present  . Feeling of Stress : Not at all  Social Connections: Moderately Isolated  . Frequency of Communication with Friends and Family: More than three times  a week  . Frequency of Social Gatherings with Friends and Family: Once a week  . Attends Religious Services: Never  . Active Member of Clubs or Organizations: No  . Attends Archivist Meetings: Never  . Marital Status: Married  Human resources officer Violence: Not At Risk  . Fear of Current or Ex-Partner: No  . Emotionally Abused: No  . Physically Abused: No  . Sexually Abused: No    Outpatient Medications Prior to Visit  Medication Sig Dispense Refill  . acetaminophen (TYLENOL) 500 MG tablet Take 1,000 mg by mouth every 6 (six) hours as needed.    Marland Kitchen allopurinol (ZYLOPRIM) 100 MG tablet Take 0.5 tablets (50 mg total) by mouth daily. 30 tablet 11  . ALPRAZolam (XANAX) 0.25 MG  tablet Take 0.25 mg by mouth daily as needed.    . cholecalciferol (VITAMIN D3) 25 MCG (1000 UNIT) tablet Take 1,000 Units by mouth in the morning and at bedtime.    . cycloSPORINE modified (NEORAL) 25 MG capsule Take 75 mg by mouth 3 (three) times daily. Take 3 tablets PO BID    . diltiazem (DILACOR XR) 240 MG 24 hr capsule Take 240 mg by mouth daily.    Marland Kitchen escitalopram (LEXAPRO) 10 MG tablet Take 1 tablet (10 mg total) by mouth daily. 30 tablet 3  . ezetimibe (ZETIA) 10 MG tablet Take 1 tablet (10 mg total) by mouth daily. 90 tablet 1  . mycophenolate (MYFORTIC) 180 MG EC tablet Take 180 mg by mouth 2 (two) times daily.    . Omega-3 Fatty Acids (FISH OIL) 1200 MG CAPS Take 2,400 mg by mouth in the morning and at bedtime.    . pantoprazole (PROTONIX) 40 MG tablet Take 40 mg by mouth daily.    . Rivaroxaban (XARELTO) 15 MG TABS tablet Take 15 mg by mouth daily with breakfast.    . sodium bicarbonate 650 MG tablet Take 650 mg by mouth 2 (two) times daily.    . tamsulosin (FLOMAX) 0.4 MG CAPS capsule Take 0.4 mg by mouth daily.    Marland Kitchen UNABLE TO FIND Vitamin D 10 mcg. Take 1 tablet PO BID.     No facility-administered medications prior to visit.    Allergies  Allergen Reactions  . Doxycycline Hives  . Doxycycline Hyclate Hives  . Simvastatin Other (See Comments)    Pt states he loss all use of muscles and he was in the hospital for 10 days     ROS Review of Systems  Constitutional: Negative for chills and fever.  HENT: Negative for congestion and sore throat.   Eyes: Negative for pain and discharge.  Respiratory: Negative for cough and shortness of breath.   Cardiovascular: Positive for palpitations (Intermittent). Negative for chest pain.  Gastrointestinal: Negative for constipation, diarrhea, nausea and vomiting.  Endocrine: Negative for polydipsia and polyuria.  Genitourinary: Negative for dysuria and hematuria.  Musculoskeletal: Negative for neck pain and neck stiffness.  Skin:  Negative for rash.  Neurological: Negative for dizziness, weakness, numbness and headaches.  Psychiatric/Behavioral: Negative for agitation and behavioral problems.      Objective:    Physical Exam Vitals reviewed.  Constitutional:      General: He is not in acute distress.    Appearance: He is not diaphoretic.  HENT:     Head: Normocephalic and atraumatic.     Nose: Nose normal.     Mouth/Throat:     Mouth: Mucous membranes are moist.  Eyes:     General:  No scleral icterus.    Extraocular Movements: Extraocular movements intact.  Cardiovascular:     Rate and Rhythm: Normal rate and regular rhythm.     Pulses: Normal pulses.     Heart sounds: Normal heart sounds. No murmur heard.   Pulmonary:     Breath sounds: Normal breath sounds. No wheezing or rales.  Abdominal:     Palpations: Abdomen is soft.     Tenderness: There is no abdominal tenderness.  Musculoskeletal:     Cervical back: Neck supple. No tenderness.     Right lower leg: No edema.     Left lower leg: No edema.  Skin:    General: Skin is warm.     Findings: No rash.  Neurological:     General: No focal deficit present.     Mental Status: He is alert and oriented to person, place, and time.     Sensory: No sensory deficit.     Motor: No weakness.  Psychiatric:        Mood and Affect: Mood normal.        Behavior: Behavior normal.     BP 128/60 (BP Location: Right Arm, Patient Position: Sitting, Cuff Size: Normal)   Pulse 71   Temp 97.8 F (36.6 C) (Oral)   Resp 18   Ht 5\' 5"  (1.651 m)   Wt 163 lb (73.9 kg)   SpO2 98%   BMI 27.12 kg/m  Wt Readings from Last 3 Encounters:  08/29/20 163 lb (73.9 kg)  08/25/20 163 lb (73.9 kg)  08/20/20 160 lb (72.6 kg)     Health Maintenance Due  Topic Date Due  . Hepatitis C Screening  Never done  . TETANUS/TDAP  Never done  . COLONOSCOPY (Pts 45-56yrs Insurance coverage will need to be confirmed)  Never done  . Zoster Vaccines- Shingrix (1 of 2) Never  done  . PNA vac Low Risk Adult (1 of 2 - PCV13) Never done  . COVID-19 Vaccine (4 - Booster for Moderna series) 03/20/2020    There are no preventive care reminders to display for this patient.  No results found for: TSH Lab Results  Component Value Date   WBC 10.2 08/09/2020   HGB 10.4 (L) 08/09/2020   HCT 31.8 (L) 08/09/2020   MCV 92.4 08/09/2020   PLT 235 08/09/2020   Lab Results  Component Value Date   NA 135 08/09/2020   K 4.4 08/09/2020   CO2 26 08/09/2020   GLUCOSE 183 (H) 08/09/2020   BUN 35 (H) 08/09/2020   CREATININE 1.48 (H) 08/09/2020   BILITOT 0.5 08/09/2020   ALKPHOS 60 08/09/2020   AST 12 (L) 08/09/2020   ALT 12 08/09/2020   PROT 6.0 (L) 08/09/2020   ALBUMIN 2.9 (L) 08/09/2020   CALCIUM 9.1 08/09/2020   ANIONGAP 9 08/09/2020   No results found for: CHOL No results found for: HDL No results found for: LDLCALC No results found for: TRIG No results found for: CHOLHDL No results found for: HGBA1C    Assessment & Plan:   Problem List Items Addressed This Visit      Inpatient hospitalization within last 85 days    [facility] chart reviewed including discharge summary F/u with Urology and Nephrology S/p abx for UTI   Cardiovascular and Mediastinum   Paroxysmal atrial fibrillation ([place])    Rate-controlled with Diltiazem On Xarelto for [facility] Planned to have cardiac ablation      Essential (primary) hypertension  BP Readings from Last 1 Encounters:  08/29/20 128/60   Well-controlled with Diltiazem (mainly for A Fib) Counseled for compliance with the medications Advised DASH diet and moderate exercise/walking, at least 150 mins/week        Genitourinary   CKD (chronic kidney disease), stage III ([place])    S/p kidney transplant On Cyclosporine, Mycophenolate On Sodium bicarb F/u with Nephrology       No orders of the defined types were placed in this encounter.   Follow-up: Return in about 6 months (around 03/01/2021) for  Routine follow up.    [name], MD

## 2020-09-16 ENCOUNTER — Telehealth: Payer: Self-pay | Admitting: *Deleted

## 2020-09-16 DIAGNOSIS — I4819 Other persistent atrial fibrillation: Secondary | ICD-10-CM

## 2020-09-16 NOTE — Telephone Encounter (Signed)
Spoke to pt's wife (dpr on file)  Called to discuss plan to switch to pre procedure cardiac CT, and cancel TEE  ([place] prefers CT if can arrange). Aware I will forward this to CT team to see if availability  to work pt onto CT schedule next Thursday/Friday Aware I will not cancel TEE until we get confirmation that have room to schedule and precerted. Wife aware I will let her know by Thursday

## 2020-09-17 NOTE — Telephone Encounter (Signed)
Aware ok to change to CT. Aware hospital will call to schedule. Wife agreeable to plan

## 2020-09-23 ENCOUNTER — Telehealth: Payer: Self-pay | Admitting: Cardiology

## 2020-09-23 ENCOUNTER — Telehealth (HOSPITAL_COMMUNITY): Payer: Self-pay | Admitting: *Deleted

## 2020-09-23 NOTE — Telephone Encounter (Signed)
Reaching out to patient to offer assistance regarding upcoming cardiac imaging study; pt's wife verbalizes understanding of appt date/time, parking situation and where to check in, pre-test NPO status and verified current allergies; name and call back number provided for further questions should they arise  Gordy Clement RN Navigator Cardiac [place] [facility] [phone] office [phone] cell  Pt's wife aware of need of blood work prior to cardiac CT.

## 2020-09-23 NOTE — Telephone Encounter (Signed)
Spoke with Cristian Henderson who states that she got labs sorted out. The patient is coming by the office tomorrow for lab work.

## 2020-09-23 NOTE — Telephone Encounter (Signed)
Merle with Cardiac Imaging is requesting to speak with an EP RN regarding patient's upcoming lab work needed on Wednesday. She would like to know which location they can have it done at. Please advise.

## 2020-09-24 ENCOUNTER — Other Ambulatory Visit: Payer: Medicare Other | Admitting: *Deleted

## 2020-09-24 ENCOUNTER — Other Ambulatory Visit: Payer: Self-pay

## 2020-09-24 DIAGNOSIS — Z01812 Encounter for preprocedural laboratory examination: Secondary | ICD-10-CM

## 2020-09-24 DIAGNOSIS — I480 Paroxysmal atrial fibrillation: Secondary | ICD-10-CM

## 2020-09-24 LAB — BASIC METABOLIC PANEL
BUN/Creatinine Ratio: 18 (ref 10–24)
BUN: 26 mg/dL (ref 8–27)
CO2: 21 mmol/L (ref 20–29)
Calcium: 9.4 mg/dL (ref 8.6–10.2)
Chloride: 101 mmol/L (ref 96–106)
Creatinine, Ser: 1.47 mg/dL — ABNORMAL HIGH (ref 0.76–1.27)
Glucose: 131 mg/dL — ABNORMAL HIGH (ref 65–99)
Potassium: 4.5 mmol/L (ref 3.5–5.2)
Sodium: 138 mmol/L (ref 134–144)
eGFR: 49 mL/min/{1.73_m2} — ABNORMAL LOW (ref >59)

## 2020-09-24 LAB — CBC
Hematocrit: 32.9 % — ABNORMAL LOW (ref 37.5–51.0)
Hemoglobin: 11.2 g/dL — ABNORMAL LOW (ref 13.0–17.7)
MCH: 29.9 pg (ref 26.6–33.0)
MCHC: 34 g/dL (ref 31.5–35.7)
MCV: 88 fL (ref 79–97)
Platelets: 182 10*3/uL (ref 150–450)
RBC: 3.75 x10E6/uL — ABNORMAL LOW (ref 4.14–5.80)
RDW: 15.2 % (ref 11.6–15.4)
WBC: 5.8 10*3/uL (ref 3.4–10.8)

## 2020-09-25 ENCOUNTER — Ambulatory Visit (HOSPITAL_COMMUNITY)
Admission: RE | Admit: 2020-09-25 | Discharge: 2020-09-25 | Disposition: A | Discharge status: Home / Self Care | Payer: Medicare Other | Source: Ambulatory Visit | Attending: Cardiology | Admitting: Cardiology

## 2020-09-25 DIAGNOSIS — I4819 Other persistent atrial fibrillation: Secondary | ICD-10-CM | POA: Insufficient documentation

## 2020-09-25 MED ORDER — IOHEXOL 350 MG/ML SOLN
80.0000 mL | Freq: Once | INTRAVENOUS | Status: AC | PRN
  Administered 2020-09-25: 80 mL via INTRAVENOUS

## 2020-09-30 ENCOUNTER — Telehealth: Payer: Self-pay | Admitting: Cardiology

## 2020-09-30 NOTE — Telephone Encounter (Signed)
  Wife is calling to find out any instructions her husband needs to follow before his ablation on Friday 10/03/20. Please advise.

## 2020-09-30 NOTE — Telephone Encounter (Signed)
Left message to call back  

## 2020-10-01 NOTE — Telephone Encounter (Signed)
Patient's wife returning call. 

## 2020-10-01 NOTE — Telephone Encounter (Signed)
Reviewed procedure instructions with wife. Informed of CT & lab result as well. Wife verbalized understanding and agreeable to plan.

## 2020-10-01 NOTE — Telephone Encounter (Signed)
Waldon's wife is returning [employer]. Please advise.

## 2020-10-01 NOTE — Telephone Encounter (Signed)
Left message to call back  

## 2020-10-02 ENCOUNTER — Ambulatory Visit: Payer: Medicare Other | Admitting: Physician Assistant

## 2020-10-02 NOTE — Pre-Procedure Instructions (Signed)
Instructed patient on the following items: Arrival time 0830 Nothing to eat or drink after midnight No meds AM of procedure Responsible person to drive you home and stay with you for 24 hrs  Have you missed any doses of anti-coagulant Xarelto-hasn't missed any doses   

## 2020-10-03 ENCOUNTER — Other Ambulatory Visit: Payer: Self-pay

## 2020-10-03 ENCOUNTER — Ambulatory Visit (HOSPITAL_COMMUNITY): Payer: Medicare Other | Admitting: Certified Registered"

## 2020-10-03 ENCOUNTER — Ambulatory Visit (HOSPITAL_COMMUNITY)
Admission: RE | Admit: 2020-10-03 | Discharge: 2020-10-03 | Disposition: A | Discharge status: Home / Self Care | Payer: Medicare Other | Attending: Cardiology | Admitting: Cardiology

## 2020-10-03 ENCOUNTER — Encounter (HOSPITAL_COMMUNITY)
Admission: RE | Disposition: A | Discharge status: Home / Self Care | Payer: Self-pay | Source: Home / Self Care | Attending: Cardiology

## 2020-10-03 ENCOUNTER — Encounter (HOSPITAL_COMMUNITY): Payer: Self-pay | Admitting: Cardiology

## 2020-10-03 DIAGNOSIS — Z940 Kidney transplant status: Secondary | ICD-10-CM | POA: Insufficient documentation

## 2020-10-03 DIAGNOSIS — I10 Essential (primary) hypertension: Secondary | ICD-10-CM | POA: Insufficient documentation

## 2020-10-03 DIAGNOSIS — I480 Paroxysmal atrial fibrillation: Secondary | ICD-10-CM | POA: Insufficient documentation

## 2020-10-03 DIAGNOSIS — F4310 Post-traumatic stress disorder, unspecified: Secondary | ICD-10-CM | POA: Insufficient documentation

## 2020-10-03 DIAGNOSIS — M109 Gout, unspecified: Secondary | ICD-10-CM | POA: Diagnosis not present

## 2020-10-03 DIAGNOSIS — Z881 Allergy status to other antibiotic agents status: Secondary | ICD-10-CM | POA: Insufficient documentation

## 2020-10-03 HISTORY — PX: ATRIAL FIBRILLATION ABLATION: EP1191

## 2020-10-03 LAB — POCT ACTIVATED CLOTTING TIME: Activated Clotting Time: 358 seconds

## 2020-10-03 SURGERY — ATRIAL FIBRILLATION ABLATION
Anesthesia: General

## 2020-10-03 SURGERY — ECHOCARDIOGRAM, TRANSESOPHAGEAL
Anesthesia: Moderate Sedation

## 2020-10-03 MED ORDER — DOBUTAMINE IN D5W 4-5 MG/ML-% IV SOLN
INTRAVENOUS | Status: DC | PRN
  Administered 2020-10-03: 20 ug/kg/min via INTRAVENOUS

## 2020-10-03 MED ORDER — HEPARIN SODIUM (PORCINE) 1000 UNIT/ML IJ SOLN
INTRAMUSCULAR | Status: DC | PRN
  Administered 2020-10-03: 1000 [IU] via INTRAVENOUS
  Administered 2020-10-03: 15000 [IU] via INTRAVENOUS

## 2020-10-03 MED ORDER — HEPARIN (PORCINE) IN NACL 1000-0.9 UT/500ML-% IV SOLN
INTRAVENOUS | Status: AC
  Filled 2020-10-03: qty 500

## 2020-10-03 MED ORDER — LIDOCAINE HCL (CARDIAC) PF 100 MG/5ML IV SOSY
PREFILLED_SYRINGE | INTRAVENOUS | Status: DC | PRN
  Administered 2020-10-03: 60 mg via INTRATRACHEAL

## 2020-10-03 MED ORDER — DEXAMETHASONE SODIUM PHOSPHATE 10 MG/ML IJ SOLN
INTRAMUSCULAR | Status: DC | PRN
  Administered 2020-10-03: 10 mg via INTRAVENOUS

## 2020-10-03 MED ORDER — ROCURONIUM BROMIDE 100 MG/10ML IV SOLN
INTRAVENOUS | Status: DC | PRN
  Administered 2020-10-03: 80 mg via INTRAVENOUS

## 2020-10-03 MED ORDER — ONDANSETRON HCL 4 MG/2ML IJ SOLN: 4.0000 mg | Freq: Four times a day (QID) | INTRAMUSCULAR | Status: DC | PRN

## 2020-10-03 MED ORDER — SODIUM CHLORIDE 0.9% FLUSH: 3.0000 mL | INTRAVENOUS | Status: DC | PRN

## 2020-10-03 MED ORDER — HEPARIN SODIUM (PORCINE) 1000 UNIT/ML IJ SOLN
INTRAMUSCULAR | Status: AC
  Filled 2020-10-03: qty 1

## 2020-10-03 MED ORDER — SODIUM CHLORIDE 0.9 % IV SOLN: INTRAVENOUS | Status: DC

## 2020-10-03 MED ORDER — FENTANYL CITRATE (PF) 100 MCG/2ML IJ SOLN
INTRAMUSCULAR | Status: DC | PRN
  Administered 2020-10-03: 50 ug via INTRAVENOUS

## 2020-10-03 MED ORDER — SUGAMMADEX SODIUM 200 MG/2ML IV SOLN
INTRAVENOUS | Status: DC | PRN
  Administered 2020-10-03: 200 mg via INTRAVENOUS

## 2020-10-03 MED ORDER — PROPOFOL 10 MG/ML IV BOLUS
INTRAVENOUS | Status: DC | PRN
  Administered 2020-10-03: 150 mg via INTRAVENOUS

## 2020-10-03 MED ORDER — PROTAMINE SULFATE 10 MG/ML IV SOLN
INTRAVENOUS | Status: DC | PRN
  Administered 2020-10-03: 40 mg via INTRAVENOUS
  Administered 2020-10-03: 30 mg via INTRAVENOUS

## 2020-10-03 MED ORDER — DOBUTAMINE IN D5W 4-5 MG/ML-% IV SOLN
INTRAVENOUS | Status: AC
  Filled 2020-10-03: qty 250

## 2020-10-03 MED ORDER — ACETAMINOPHEN 325 MG PO TABS
650.0000 mg | ORAL_TABLET | ORAL | Status: DC | PRN
  Filled 2020-10-03: qty 2

## 2020-10-03 MED ORDER — ONDANSETRON HCL 4 MG/2ML IJ SOLN
INTRAMUSCULAR | Status: DC | PRN
  Administered 2020-10-03: 4 mg via INTRAVENOUS

## 2020-10-03 MED ORDER — HEPARIN (PORCINE) IN NACL 1000-0.9 UT/500ML-% IV SOLN
INTRAVENOUS | Status: DC | PRN
  Administered 2020-10-03 (×4): 500 mL

## 2020-10-03 MED ORDER — SODIUM CHLORIDE 0.9% FLUSH: 3.0000 mL | Freq: Two times a day (BID) | INTRAVENOUS | Status: DC

## 2020-10-03 MED ORDER — HEPARIN SODIUM (PORCINE) 1000 UNIT/ML IJ SOLN
INTRAMUSCULAR | Status: DC | PRN
  Administered 2020-10-03: 1000 [IU] via INTRAVENOUS

## 2020-10-03 MED ORDER — SODIUM CHLORIDE 0.9 % IV SOLN: 250.0000 mL | INTRAVENOUS | Status: DC | PRN

## 2020-10-03 SURGICAL SUPPLY — 21 items
BAG SNAP BAND KOVER 36X36 (MISCELLANEOUS) ×3 IMPLANT
BLANKET WARM UNDERBOD FULL ACC (MISCELLANEOUS) ×3 IMPLANT
CATH MAPPNG PENTARAY F 2-6-2MM (CATHETERS) ×1 IMPLANT
CATH S CIRCA THERM PROBE 10F (CATHETERS) ×3 IMPLANT
CATH SMTCH THERMOCOOL SF DF (CATHETERS) ×3 IMPLANT
CATH SOUNDSTAR ECO 8FR (CATHETERS) ×3 IMPLANT
CATH WEBSTER BI DIR CS D-F CRV (CATHETERS) ×3 IMPLANT
CLOSURE PERCLOSE PROSTYLE (VASCULAR PRODUCTS) ×12 IMPLANT
COVER SWIFTLINK CONNECTOR (BAG) ×3 IMPLANT
KIT VERSACROSS STEERABLE D1 (CATHETERS) ×3 IMPLANT
PACK EP LATEX FREE (CUSTOM PROCEDURE TRAY) ×2
PACK EP LF (CUSTOM PROCEDURE TRAY) ×1 IMPLANT
PAD PRO RADIOLUCENT 2001M-C (PAD) ×3 IMPLANT
PATCH CARTO3 (PAD) ×3 IMPLANT
PENTARAY F 2-6-2MM (CATHETERS) ×3
SHEATH CARTO VIZIGO SM CVD (SHEATH) ×3 IMPLANT
SHEATH PINNACLE 7F 10CM (SHEATH) ×3 IMPLANT
SHEATH PINNACLE 8F 10CM (SHEATH) ×6 IMPLANT
SHEATH PINNACLE 9F 10CM (SHEATH) ×3 IMPLANT
SHEATH PROBE COVER 6X72 (BAG) ×3 IMPLANT
TUBING SMART ABLATE COOLFLOW (TUBING) ×3 IMPLANT

## 2020-10-03 NOTE — H&P (Signed)
Electrophysiology Office Note   Date:  10/03/2020   ID:  Cristian Henderson, DOB 1945/10/05, MRN [id number]  PCP:  [name], MD  Cardiologist:  Johnsie Cancel Primary Electrophysiologist:  Lunette Tapp Meredith Leeds, MD    Chief Complaint: AF   History of Present Illness: Cristian Henderson is a 75 y.o. male who is being seen today for the evaluation of AF at the request of No ref. provider found. Presenting today for electrophysiology evaluation.  He has a history of atrial fibrillation, hypertension, gout, PTSD.  He is status post renal transplant in 2013.  He was admitted to the hospital June 2020 with heart failure and new onset atrial fibrillation.  He converted to sinus rhythm with Cardizem.  He was readmitted 01/12/2019 with junctional rhythm and hypokalemia as well as worsening renal dysfunction.  Today, he denies symptoms of palpitations, chest pain, shortness of breath, orthopnea, PND, lower extremity edema, claudication, dizziness, presyncope, syncope, bleeding, or neurologic sequela. The patient is tolerating medications without difficulties.  He does get chest pain and shortness of breath when he is in atrial fibrillation.  This was confirmed on cardiac monitor.  He feels well today.  He is usually quite active, able to do all of his daily activities unless he is in atrial fibrillation.   Past Medical History:  Diagnosis Date   Acid reflux    Anxiety    Arthritis    Atrial fibrillation ([facility])    Depression    Dysrhythmia    AFIB   Gout    High cholesterol    Hypertension    Kidney failure    PTSD (post-traumatic stress disorder)    Past Surgical History:  Procedure Laterality Date   CYSTOSCOPY  08/07/2020   Procedure: CYSTOSCOPY, INSERTION OF FOLEY CATHETER;  Surgeon: Cleon Gustin, MD;  Location: [facility];  Service: Urology;;   KIDNEY TRANSPLANT Right 2013   REPLACEMENT TOTAL KNEE Right    SKIN CANCER EXCISION     TOTAL SHOULDER REPLACEMENT Left      Current  Facility-Administered Medications  Medication Dose Route Frequency Provider Last Rate Last Admin   0.9 %  sodium chloride infusion   Intravenous Continuous Constance Haw, MD 50 mL/hr at 10/03/20 0918 New Bag at 10/03/20 0918    Allergies:   Doxycycline, Doxycycline hyclate, and Simvastatin   Social History:  The patient  reports that he has never smoked. He has never used smokeless tobacco. He reports previous alcohol use. He reports that he does not use drugs.   Family History:  The patient's family history includes Alzheimer's disease in his mother; Diabetes in his mother.    ROS:  Please see the history of present illness.   Otherwise, review of systems is positive for none.   All other systems are reviewed and negative.    PHYSICAL EXAM: VS:  BP (!) 142/66   Pulse 60   Temp 97.9 F (36.6 C)   Resp 18   Ht 5\' 5"  (1.651 m)   Wt 72.6 kg   SpO2 99%   BMI 26.63 kg/m  , BMI Body mass index is 26.63 kg/m. GEN: Well nourished, well developed, in no acute distress  HEENT: normal  Neck: no JVD, carotid bruits, or masses Cardiac: RRR; no murmurs, rubs, or gallops,no edema  Respiratory:  clear to auscultation bilaterally, normal work of breathing GI: soft, nontender, nondistended, + BS MS: no deformity or atrophy  Skin: warm and dry Neuro:  Strength and sensation are intact  Psych: euthymic mood, full affect  EKG:  EKG is ordered today. Personal review of the ekg ordered shows rhythm, rate 67  Recent Labs: 08/09/2020: ALT 12; Magnesium 1.8 09/24/2020: BUN 26; Creatinine, Ser 1.47; Hemoglobin 11.2; Platelets 182; Potassium 4.5; Sodium 138    Lipid Panel  No results found for: CHOL, TRIG, HDL, CHOLHDL, VLDL, LDLCALC, LDLDIRECT   Wt Readings from Last 3 Encounters:  10/03/20 72.6 kg  08/29/20 73.9 kg  08/25/20 73.9 kg      Other studies Reviewed: Additional studies/ records that were reviewed today include: Cardiac monitor 08/06/2020 personally reviewed Review of the  above records today demonstrates:  Frequent bouts of rapid PAF/Flutter/Fib that Correlate with symptoms of dyspnea and palpitations  TTE 06/05/2020  1. Left ventricular ejection fraction, by estimation, is 55 to 60%. The  left ventricle has normal function. The left ventricle has no regional  wall motion abnormalities. There is mild left ventricular hypertrophy.  Left ventricular diastolic parameters  were normal.   2. Right ventricular systolic function is normal. The right ventricular  size is normal.   3. The mitral valve is normal in structure. Trivial mitral valve  regurgitation. No evidence of mitral stenosis.   4. The aortic valve is tricuspid. Aortic valve regurgitation is mild to  moderate. No aortic stenosis is present.   5. The inferior vena cava is normal in size with greater than 50%  respiratory variability, suggesting right atrial pressure of 3 mmHg.   ASSESSMENT AND PLAN:  1.  Paroxysmal atrial fibrillation: Cristian Henderson has presented today for surgery, with the diagnosis of AF.  The various methods of treatment have been discussed with the patient and family. After consideration of risks, benefits and other options for treatment, the patient has consented to  Procedure(s): Catheter ablation as a surgical intervention .  Risks include but not limited to complete heart block, stroke, esophageal damage, nerve damage, bleeding, vascular damage, tamponade, perforation, MI, and death. The patient's history has been reviewed, patient examined, no change in status, stable for surgery.  I have reviewed the patient's chart and labs.  Questions were answered to the patient's satisfaction.    Kynslee Baham Curt Bears, MD 10/03/2020 10:49 AM

## 2020-10-03 NOTE — Anesthesia Procedure Notes (Signed)
Procedure Name: Intubation Date/Time: 10/03/2020 11:23 AM Performed by: Myna Bright, CRNA Pre-anesthesia Checklist: Patient identified, Emergency Drugs available, Suction available and Patient being monitored Patient Re-evaluated:Patient Re-evaluated prior to induction Oxygen Delivery Method: Circle system utilized Preoxygenation: Pre-oxygenation with 100% oxygen Induction Type: IV induction Ventilation: Mask ventilation without difficulty Laryngoscope Size: Mac and 4 Grade View: Grade I Tube type: Oral Tube size: 7.5 mm Number of attempts: 1 Airway Equipment and Method: Stylet Placement Confirmation: ETT inserted through vocal cords under direct vision, positive ETCO2 and breath sounds checked- equal and bilateral Secured at: 22 cm Tube secured with: Tape Dental Injury: Teeth and Oropharynx as per pre-operative assessment

## 2020-10-03 NOTE — Discharge Instructions (Signed)

## 2020-10-03 NOTE — Progress Notes (Signed)
Pt ambulated without difficulty or bleeding.   Discharged home with his wife who will drive and stay with pt x 24 hrs. 

## 2020-10-03 NOTE — Transfer of Care (Signed)
Immediate Anesthesia Transfer of Care Note  Patient: Cristian Henderson  Procedure(s) Performed: ATRIAL FIBRILLATION ABLATION  Patient Location: Cath Lab  Anesthesia Type:General  Level of Consciousness: awake, alert , oriented and patient cooperative  Airway & Oxygen Therapy: Patient Spontanous Breathing and Patient connected to face mask oxygen  Post-op Assessment: Report given to RN, Post -op Vital signs reviewed and stable and Patient moving all extremities  Post vital signs: Reviewed and stable  Last Vitals:  Vitals Value Taken Time  BP 128/56 10/03/20 1323  Temp 36.8 C 10/03/20 1315  Pulse 60 10/03/20 1328  Resp 16 10/03/20 1328  SpO2 93 % 10/03/20 1328  Vitals shown include unvalidated device data.  Last Pain:  Vitals:   10/03/20 1315  TempSrc: Temporal  PainSc: 0-No pain         Complications: No notable events documented.

## 2020-10-03 NOTE — Anesthesia Preprocedure Evaluation (Signed)
Anesthesia Evaluation  Patient identified by MRN, date of birth, ID band Patient awake    Reviewed: Allergy & Precautions, NPO status , Patient's Chart, lab work & pertinent test results  History of Anesthesia Complications Negative for: history of anesthetic complications  Airway Mallampati: I  TM Distance: >3 FB Neck ROM: Full    Dental  (+) Upper Dentures, Partial Lower, Poor Dentition, Dental Advisory Given,    Pulmonary neg shortness of breath, neg sleep apnea, neg COPD, neg recent URI,  Covid-19 Nucleic Acid Test Results Lab Results      Component                Value               Date                      SARSCOV2NAA              NEGATIVE            08/05/2020                [place]              NEGATIVE            07/28/2020              breath sounds clear to auscultation       Cardiovascular hypertension, Pt. on medications (-) angina(-) Past MI and (-) CHF + dysrhythmias Atrial Fibrillation  Rhythm:Irregular  1. Left ventricular ejection fraction, by estimation, is 55 to 60%. The  left ventricle has normal function. The left ventricle has no regional  wall motion abnormalities. There is mild left ventricular hypertrophy.  Left ventricular diastolic parameters  were normal.  2. Right ventricular systolic function is normal. The right ventricular  size is normal.  3. The mitral valve is normal in structure. Trivial mitral valve  regurgitation. No evidence of mitral stenosis.  4. The aortic valve is tricuspid. Aortic valve regurgitation is mild to  moderate. No aortic stenosis is present.  5. The inferior vena cava is normal in size with greater than 50%  respiratory variability, suggesting right atrial pressure of 3 mmHg.   Neuro/Psych PSYCHIATRIC DISORDERS Anxiety Depression negative neurological ROS     GI/Hepatic Neg liver ROS, GERD  Medicated and Controlled,  Endo/Other  negative endocrine ROS   Renal/GU CRFRenal diseaseLab Results      Component                Value               Date                      CREATININE               1.47 (H)            09/24/2020                Musculoskeletal  (+) Arthritis ,   Abdominal   Peds  Hematology  (+) Blood dyscrasia, anemia , Lab Results      Component                Value               Date  WBC                      5.8                 09/24/2020                HGB                      11.2 (L)            09/24/2020                HCT                      32.9 (L)            09/24/2020                MCV                      88                  09/24/2020                PLT                      182                 09/24/2020            xarelto   Anesthesia Other Findings   Reproductive/Obstetrics                             Anesthesia Physical Anesthesia Plan  ASA: 3  Anesthesia Plan: General   Post-op Pain Management:    Induction: Intravenous  PONV Risk Score and Plan: 2 and Ondansetron and Dexamethasone  Airway Management Planned: Oral ETT  Additional Equipment: None  Intra-op Plan:   Post-operative Plan: Extubation in OR  Informed Consent: I have reviewed the patients History and Physical, chart, labs and discussed the procedure including the risks, benefits and alternatives for the proposed anesthesia with the patient or authorized representative who has indicated his/her understanding and acceptance.     Dental advisory given  Plan Discussed with: CRNA and Surgeon  Anesthesia Plan Comments:         Anesthesia Quick Evaluation

## 2020-10-03 NOTE — Anesthesia Postprocedure Evaluation (Signed)
Anesthesia Post Note  Patient: Cristian Henderson  Procedure(s) Performed: ATRIAL FIBRILLATION ABLATION     Patient location during evaluation: Endoscopy Anesthesia Type: General Level of consciousness: awake and alert Pain management: pain level controlled Vital Signs Assessment: post-procedure vital signs reviewed and stable Respiratory status: spontaneous breathing, nonlabored ventilation, respiratory function stable and patient connected to nasal cannula oxygen Cardiovascular status: blood pressure returned to baseline and stable Postop Assessment: no apparent nausea or vomiting Anesthetic complications: no   No notable events documented.  Last Vitals:  Vitals:   10/03/20 1445 10/03/20 1515  BP: (!) 144/59 (!) 148/63  Pulse: 68 65  Resp: (!) 22 20  Temp:    SpO2: 97% 97%    Last Pain:  Vitals:   10/03/20 1355  TempSrc:   PainSc: 0-No pain                 Kloee Ballew

## 2020-10-07 ENCOUNTER — Encounter (HOSPITAL_COMMUNITY): Payer: Self-pay | Admitting: Cardiology

## 2020-10-15 ENCOUNTER — Telehealth: Payer: Self-pay | Admitting: *Deleted

## 2020-10-15 NOTE — Telephone Encounter (Signed)
-----   Message from Will Meredith Leeds, MD sent at 09/26/2020  9:04 AM EDT ----- CT without LAA thrombus.  Significantly elevated coronary calcium score.  Needs Myoview.

## 2020-10-15 NOTE — Telephone Encounter (Signed)
Left message to call back  

## 2020-10-31 ENCOUNTER — Other Ambulatory Visit: Payer: Self-pay

## 2020-10-31 ENCOUNTER — Ambulatory Visit (HOSPITAL_COMMUNITY)
Admission: RE | Admit: 2020-10-31 | Discharge: 2020-10-31 | Disposition: A | Discharge status: Home / Self Care | Payer: Medicare Other | Source: Ambulatory Visit | Attending: Physician Assistant | Admitting: Physician Assistant

## 2020-10-31 ENCOUNTER — Encounter (HOSPITAL_COMMUNITY): Payer: Self-pay | Admitting: Physician Assistant

## 2020-10-31 VITALS — BP 134/62 | HR 72 | Ht 65.0 in | Wt 163.2 lb

## 2020-10-31 DIAGNOSIS — D6869 Other thrombophilia: Secondary | ICD-10-CM

## 2020-10-31 DIAGNOSIS — Z79899 Other long term (current) drug therapy: Secondary | ICD-10-CM | POA: Diagnosis not present

## 2020-10-31 DIAGNOSIS — I480 Paroxysmal atrial fibrillation: Secondary | ICD-10-CM | POA: Diagnosis not present

## 2020-10-31 DIAGNOSIS — I10 Essential (primary) hypertension: Secondary | ICD-10-CM | POA: Insufficient documentation

## 2020-10-31 DIAGNOSIS — Z7901 Long term (current) use of anticoagulants: Secondary | ICD-10-CM | POA: Insufficient documentation

## 2020-10-31 NOTE — Progress Notes (Signed)
Primary Care Physician: [name], MD Primary Cardiologist: Dr Johnsie Cancel Primary Electrophysiologist: Dr Curt Bears Referring Physician: Dr Faythe Dingwall [name] is a 75 y.o. male with a history of HTN, gout, PTSD, kidney failure s/p transplant 2013, atrial fibrillation who presents for follow up in the [facility]. He was admitted to the hospital June 2020 with heart failure and new onset atrial fibrillation.  He converted to sinus rhythm with Cardizem.  He was readmitted 01/12/2019 with junctional rhythm and hypokalemia as well as worsening renal dysfunction. Patient is on Xarelto for a CHADS2VASC score of 3.   On follow up today, patient is s/p afib ablation with Dr Curt Bears on 10/03/20. Patient reports that he has done well since the procedure. He did have two episodes of what felt like afib but he states that theses were brief and not as severe as before his ablation. He also reports one episode of dizziness which resolved quickly, no LOC.  Today, he denies symptoms of palpitations, chest pain, shortness of breath, orthopnea, PND, lower extremity edema, presyncope, syncope, snoring, daytime somnolence, bleeding, or neurologic sequela. The patient is tolerating medications without difficulties and is otherwise without complaint today.    Atrial Fibrillation Risk Factors:  he does not have symptoms or diagnosis of sleep apnea. he does not have a history of rheumatic fever.   he has a BMI of Body mass index is 27.16 kg/m.Marland Kitchen Filed Weights   10/31/20 0903  Weight: 74 kg    Family History  Problem Relation Age of Onset   Alzheimer's disease Mother    Diabetes Mother      Atrial Fibrillation Management history:  Previous antiarrhythmic drugs: none Previous cardioversions: none Previous ablations: 10/03/20 CHADS2VASC score: 3 Anticoagulation history: Xarelto    Past Medical History:  Diagnosis Date   Acid reflux    Anxiety    Arthritis     Atrial fibrillation (HCC)    Depression    Dysrhythmia    AFIB   Gout    High cholesterol    Hypertension    Kidney failure    PTSD (post-traumatic stress disorder)    Past Surgical History:  Procedure Laterality Date   ATRIAL FIBRILLATION ABLATION N/A 10/03/2020   Procedure: ATRIAL FIBRILLATION ABLATION;  Surgeon: Constance Haw, MD;  Location: [place] [facility];  Service: Cardiovascular;  Laterality: N/A;   CYSTOSCOPY  08/07/2020   Procedure: CYSTOSCOPY, INSERTION OF FOLEY CATHETER;  Surgeon: Cleon Gustin, MD;  Location: [facility];  Service: Urology;;   KIDNEY TRANSPLANT Right 2013   REPLACEMENT TOTAL KNEE Right    SKIN CANCER EXCISION     TOTAL SHOULDER REPLACEMENT Left     Current Outpatient Medications  Medication Sig Dispense Refill   acetaminophen (TYLENOL) 500 MG tablet Take 1,000 mg by mouth every 6 (six) hours as needed for mild pain or moderate pain (Back pain).     allopurinol (ZYLOPRIM) 100 MG tablet Take 0.5 tablets (50 mg total) by mouth daily. 30 tablet 11   cholecalciferol (VITAMIN D3) 25 MCG (1000 UNIT) tablet Take 1,000 Units by mouth in the morning and at bedtime.     cycloSPORINE modified (NEORAL) 25 MG capsule Take 75 mg by mouth 2 (two) times daily.     diltiazem (DILACOR XR) 240 MG 24 hr capsule Take 240 mg by mouth daily.     escitalopram (LEXAPRO) 10 MG tablet Take 1 tablet (10 mg total) by mouth daily. 30 tablet 3  ezetimibe (ZETIA) 10 MG tablet Take 1 tablet (10 mg total) by mouth daily. 90 tablet 1   mycophenolate (MYFORTIC) 180 MG EC tablet Take 180 mg by mouth 2 (two) times daily.     Omega-3 Fatty Acids (FISH OIL) 1200 MG CAPS Take 2,400 mg by mouth in the morning and at bedtime.     omeprazole (PRILOSEC) 40 MG capsule Take 40 mg by mouth daily.     Rivaroxaban (XARELTO) 15 MG TABS tablet Take 15 mg by mouth daily with breakfast.     sodium bicarbonate 650 MG tablet Take 650 mg by mouth 2 (two) times daily.     tamsulosin (FLOMAX) 0.4 MG  CAPS capsule Take 0.4 mg by mouth daily.     No current facility-administered medications for this encounter.    Allergies  Allergen Reactions   Doxycycline Hives   Doxycycline Hyclate Hives   Simvastatin Other (See Comments)    Pt states he loss all use of muscles and he was in the hospital for 10 days     Social History   Socioeconomic History   Marital status: Married    Spouse name: Not on file   Number of children: Not on file   Years of education: Not on file   Highest education level: Not on file  Occupational History   Not on file  Tobacco Use   Smoking status: Never    Passive exposure: Past   Smokeless tobacco: Never  Vaping Use   Vaping Use: Never used  Substance and Sexual Activity   Alcohol use: Not Currently   Drug use: Never   Sexual activity: Not Currently  Other Topics Concern   Not on file  Social History Narrative   Not on file   Social Determinants of Health   Financial Resource Strain: Low Risk    Difficulty of Paying Living Expenses: Not hard at all  Food Insecurity: No Food Insecurity   Worried About Charity fundraiser in the Last Year: Never true   Ran Out of Food in the Last Year: Never true  Transportation Needs: No Transportation Needs   Lack of Transportation (Medical): No   Lack of Transportation (Non-Medical): No  Physical Activity: Insufficiently Active   Days of Exercise per Week: 3 days   Minutes of Exercise per Session: 30 min  Stress: No Stress Concern Present   Feeling of Stress : Not at all  Social Connections: Moderately Isolated   Frequency of Communication with Friends and Family: More than three times a week   Frequency of Social Gatherings with Friends and Family: Once a week   Attends Religious Services: Never   Marine scientist or Organizations: No   Attends Music therapist: Never   Marital Status: Married  Human resources officer Violence: Not At Risk   Fear of Current or Ex-Partner: No    Emotionally Abused: No   Physically Abused: No   Sexually Abused: No     ROS- All systems are reviewed and negative except as per the HPI above.  Physical Exam: Vitals:   10/31/20 0903  BP: 134/62  Pulse: 72  SpO2: 98%  Weight: 74 kg  Height: 5\' 5"  (1.651 m)    GEN- The patient is a well appearing elderly male, alert and oriented x 3 today.   Head- normocephalic, atraumatic Eyes-  Sclera clear, conjunctiva pink Ears- hearing intact Oropharynx- clear Neck- supple  Lungs- Clear to ausculation bilaterally, normal work of breathing Heart- Regular  rate and rhythm, no murmurs, rubs or gallops  GI- soft, NT, ND, + BS Extremities- no clubbing, cyanosis, or edema MS- no significant deformity or atrophy Skin- no rash or lesion Psych- euthymic mood, full affect Neuro- strength and sensation are intact  Wt Readings from Last 3 Encounters:  10/31/20 74 kg  10/03/20 72.6 kg  08/29/20 73.9 kg    EKG today demonstrates  SR Vent. rate 72 BPM PR interval 150 ms QRS duration 86 ms QT/QTcB 390/427 ms  Echo 06/05/20 demonstrated   1. Left ventricular ejection fraction, by estimation, is 55 to 60%. The  left ventricle has normal function. The left ventricle has no regional  wall motion abnormalities. There is mild left ventricular hypertrophy.  Left ventricular diastolic parameters  were normal.   2. Right ventricular systolic function is normal. The right ventricular  size is normal.   3. The mitral valve is normal in structure. Trivial mitral valve  regurgitation. No evidence of mitral stenosis.   4. The aortic valve is tricuspid. Aortic valve regurgitation is mild to  moderate. No aortic stenosis is present.   5. The inferior vena cava is normal in size with greater than 50%  respiratory variability, suggesting right atrial pressure of 3 mmHg.   Epic records are reviewed at length today  CHA2DS2-VASc Score = 3  The patient's score is based upon: CHF History: No HTN  History: Yes Diabetes History: No Stroke History: No Vascular Disease History: No Age Score: 2 Gender Score: 0     ASSESSMENT AND PLAN: 1. Paroxysmal Atrial Fibrillation (ICD10:  I48.0) The patient's CHA2DS2-VASc score is 3, indicating a 3.2% annual risk of stroke.   S/p afib ablation 10/03/20 Patient appears to be maintaining SR. Continue diltiazem 240 mg daily Continue Xarelto 15 mg daily  2. Secondary Hypercoagulable State (ICD10:  D68.69) The patient is at significant risk for stroke/thromboembolism based upon his CHA2DS2-VASc Score of 3.  Continue Rivaroxaban (Xarelto).   3. HTN Stable, no changes today.   Follow up with Dr Johnsie Cancel and Dr Curt Bears as scheduled.    [facility] [address] [place], [place] [address] [phone] 10/31/2020 9:14 AM

## 2020-11-13 NOTE — Progress Notes (Signed)
Primary Care Physician: [name], MD Primary Cardiologist: Dr Johnsie Cancel Primary Electrophysiologist: Dr Curt Bears  75 y.o. with history of HTN, Gout and PTSD post renal transplant 2013 Moved from [place] 2021 History of PAF/bradycardia particularly in setting of high K and renal failure Intolerant to statins on Zetia    Ex myovue: 09/24/18 normal EF 67% Echo 11/06/18 Normal EF 55-60% mild LVH mild AR/MR LA 39 mm  Monitor 07/04/20 with frequent PAF SSS/Bradycardia  Echo: 06/05/20 EF 50-55% mild/mod AR   He was admitted to the hospital June 2020 with heart failure and new onset atrial fibrillation.  He converted to sinus rhythm with Cardizem.  He was readmitted 01/12/2019 with junctional rhythm and hyperkalemia as well as worsening renal dysfunction. Patient is on Xarelto for a CHADS2VASC score of 3.   On follow up today, patient is s/p afib ablation with Dr Curt Bears on 10/03/20. Patient reports that he has done well since the procedure.   Remarried x 6 years Daughter in [place] and one in [place] Retired Development worker, international aid With 4 grand children   Doing great since ablation no palpitations  .    Atrial Fibrillation Risk Factors:  he does not have symptoms or diagnosis of sleep apnea. he does not have a history of rheumatic fever.   he has a BMI of Body mass index is 27.29 kg/m.Marland Kitchen Filed Weights   11/21/20 0906  Weight: 74.4 kg     Family History  Problem Relation Age of Onset   Alzheimer's disease Mother    Diabetes Mother      Atrial Fibrillation Management history:  Previous antiarrhythmic drugs: none Previous cardioversions: none Previous ablations: 10/03/20 CHADS2VASC score: 3 Anticoagulation history: Xarelto    Past Medical History:  Diagnosis Date   Acid reflux    Anxiety    Arthritis    Atrial fibrillation ([place])    Depression    Dysrhythmia    AFIB   Gout    High cholesterol    Hypertension    Kidney failure    PTSD (post-traumatic stress disorder)    Past Surgical  History:  Procedure Laterality Date   ATRIAL FIBRILLATION ABLATION N/A 10/03/2020   Procedure: ATRIAL FIBRILLATION ABLATION;  Surgeon: Constance Haw, MD;  Location: [place] [facility];  Service: Cardiovascular;  Laterality: N/A;   CYSTOSCOPY  08/07/2020   Procedure: CYSTOSCOPY, INSERTION OF FOLEY CATHETER;  Surgeon: Cleon Gustin, MD;  Location: [facility];  Service: Urology;;   KIDNEY TRANSPLANT Right 2013   REPLACEMENT TOTAL KNEE Right    SKIN CANCER EXCISION     TOTAL SHOULDER REPLACEMENT Left     Current Outpatient Medications  Medication Sig Dispense Refill   acetaminophen (TYLENOL) 500 MG tablet Take 1,000 mg by mouth every 6 (six) hours as needed for mild pain or moderate pain (Back pain).     allopurinol (ZYLOPRIM) 100 MG tablet Take 0.5 tablets (50 mg total) by mouth daily. 30 tablet 11   cholecalciferol (VITAMIN D3) 25 MCG (1000 UNIT) tablet Take 1,000 Units by mouth in the morning and at bedtime.     cycloSPORINE modified (NEORAL) 25 MG capsule Take 75 mg by mouth 2 (two) times daily.     diltiazem (DILACOR XR) 240 MG 24 hr capsule Take 240 mg by mouth daily.     escitalopram (LEXAPRO) 10 MG tablet Take 1 tablet (10 mg total) by mouth daily. 30 tablet 3   ezetimibe (ZETIA) 10 MG tablet Take 1 tablet (10 mg total)  by mouth daily. 90 tablet 1   mycophenolate (MYFORTIC) 180 MG EC tablet Take 180 mg by mouth 2 (two) times daily.     Omega-3 Fatty Acids (FISH OIL) 1200 MG CAPS Take 2,400 mg by mouth in the morning and at bedtime.     omeprazole (PRILOSEC) 40 MG capsule Take 40 mg by mouth daily.     Rivaroxaban (XARELTO) 15 MG TABS tablet Take 15 mg by mouth daily with breakfast.     sodium bicarbonate 650 MG tablet Take 650 mg by mouth 2 (two) times daily.     tamsulosin (FLOMAX) 0.4 MG CAPS capsule Take 0.4 mg by mouth daily.     No current facility-administered medications for this visit.    Allergies  Allergen Reactions   Doxycycline Hives   Doxycycline Hyclate Hives    Simvastatin Other (See Comments)    Pt states he loss all use of muscles and he was in the hospital for 10 days     Social History   Socioeconomic History   Marital status: Married    Spouse name: Not on file   Number of children: Not on file   Years of education: Not on file   Highest education level: Not on file  Occupational History   Not on file  Tobacco Use   Smoking status: Never    Passive exposure: Past   Smokeless tobacco: Never  Vaping Use   Vaping Use: Never used  Substance and Sexual Activity   Alcohol use: Not Currently   Drug use: Never   Sexual activity: Not Currently  Other Topics Concern   Not on file  Social History Narrative   Not on file   Social Determinants of Health   Financial Resource Strain: Low Risk    Difficulty of Paying Living Expenses: Not hard at all  Food Insecurity: No Food Insecurity   Worried About Charity fundraiser in the Last Year: Never true   Ran Out of Food in the Last Year: Never true  Transportation Needs: No Transportation Needs   Lack of Transportation (Medical): No   Lack of Transportation (Non-Medical): No  Physical Activity: Insufficiently Active   Days of Exercise per Week: 3 days   Minutes of Exercise per Session: 30 min  Stress: No Stress Concern Present   Feeling of Stress : Not at all  Social Connections: Moderately Isolated   Frequency of Communication with Friends and Family: More than three times a week   Frequency of Social Gatherings with Friends and Family: Once a week   Attends Religious Services: Never   Marine scientist or Organizations: No   Attends Music therapist: Never   Marital Status: Married  Human resources officer Violence: Not At Risk   Fear of Current or Ex-Partner: No   Emotionally Abused: No   Physically Abused: No   Sexually Abused: No     ROS- All systems are reviewed and negative except as per the HPI above.  Physical Exam: Vitals:   11/21/20 0906  BP: (!)  142/62  Pulse: 72  SpO2: 98%  Weight: 74.4 kg  Height: 5\' 5"  (1.651 m)    Affect appropriate Healthy:  appears stated age HEENT: normal Neck supple with no adenopathy JVP normal no bruits no thyromegaly Lungs clear with no wheezing and good diaphragmatic motion Heart:  S1/S2 no murmur, no rub, gallop or click PMI normal Abdomen: benighn, BS positve, no tenderness, no AAA no bruit.  No HSM or  HJR Distal pulses intact with no bruits No edema Neuro non-focal Skin warm and dry No muscular weakness   Wt Readings from Last 3 Encounters:  11/21/20 74.4 kg  10/31/20 74 kg  10/03/20 72.6 kg    EKG today demonstrates  SR Vent. rate 72 BPM PR interval 150 ms QRS duration 86 ms QT/QTcB 390/427 ms  Echo 06/05/20 demonstrated   1. Left ventricular ejection fraction, by estimation, is 55 to 60%. The  left ventricle has normal function. The left ventricle has no regional  wall motion abnormalities. There is mild left ventricular hypertrophy.  Left ventricular diastolic parameters  were normal.   2. Right ventricular systolic function is normal. The right ventricular  size is normal.   3. The mitral valve is normal in structure. Trivial mitral valve  regurgitation. No evidence of mitral stenosis.   4. The aortic valve is tricuspid. Aortic valve regurgitation is mild to  moderate. No aortic stenosis is present.   5. The inferior vena cava is normal in size with greater than 50%  respiratory variability, suggesting right atrial pressure of 3 mmHg.   Epic records are reviewed at length today  CHA2DS2-VASc Score = 3  The patient's score is based upon: CHF History: No HTN History: Yes Diabetes History: No Stroke History: No Vascular Disease History: No Age Score: 2 Gender Score: 0     ASSESSMENT AND PLAN: 1. Paroxysmal Atrial Fibrillation (ICD10:  I48.0) The patient's CHA2DS2-VASc score is 3, indicating a 3.2% annual risk of stroke.   S/p afib ablation 10/03/20 Patient  appears to be maintaining SR. Continue diltiazem 240 mg daily Continue Xarelto 15 mg daily renal dose adjusted   2. PTSD Previously in [employer] on Lexapro stable   3. Renal Transplant Immunosuppressed with Myfortic and cyclosporine Cr 1.47 09/24/20  4. Gout: Continue allopurinol   5. HLD: Continue zetia intolerant to statins   6. GERD: Low carb diet Prilosec stable    F/U EP September and me in a year   Jenkins Rouge MD [facility]

## 2020-11-19 ENCOUNTER — Other Ambulatory Visit: Payer: Self-pay

## 2020-11-19 ENCOUNTER — Ambulatory Visit (HOSPITAL_COMMUNITY)
Admission: RE | Admit: 2020-11-19 | Discharge: 2020-11-19 | Disposition: A | Discharge status: Home / Self Care | Payer: Medicare Other | Source: Ambulatory Visit | Attending: Urology | Admitting: Urology

## 2020-11-19 ENCOUNTER — Other Ambulatory Visit: Payer: Self-pay | Admitting: Urology

## 2020-11-19 DIAGNOSIS — N131 Hydronephrosis with ureteral stricture, not elsewhere classified: Secondary | ICD-10-CM | POA: Diagnosis present

## 2020-11-21 ENCOUNTER — Ambulatory Visit (INDEPENDENT_AMBULATORY_CARE_PROVIDER_SITE_OTHER): Payer: Medicare Other | Admitting: Cardiovascular Disease

## 2020-11-21 ENCOUNTER — Other Ambulatory Visit: Payer: Self-pay

## 2020-11-21 ENCOUNTER — Encounter: Payer: Self-pay | Admitting: Cardiovascular Disease

## 2020-11-21 VITALS — BP 142/62 | HR 72 | Ht 65.0 in | Wt 164.0 lb

## 2020-11-21 DIAGNOSIS — F4310 Post-traumatic stress disorder, unspecified: Secondary | ICD-10-CM | POA: Diagnosis not present

## 2020-11-21 DIAGNOSIS — T8610 Unspecified complication of kidney transplant: Secondary | ICD-10-CM

## 2020-11-21 DIAGNOSIS — I480 Paroxysmal atrial fibrillation: Secondary | ICD-10-CM

## 2020-11-21 DIAGNOSIS — E782 Mixed hyperlipidemia: Secondary | ICD-10-CM | POA: Diagnosis not present

## 2020-11-21 NOTE — Patient Instructions (Signed)
Medication Instructions:  Your physician recommends that you continue on your current medications as directed. Please refer to the Current Medication list given to you today.  *If you need a refill on your cardiac medications before your next appointment, please call your pharmacy*   Lab Work: NONE   If you have labs (blood work) drawn today and your tests are completely normal, you will receive your results only by: . MyChart Message (if you have MyChart) OR . A paper copy in the mail If you have any lab test that is abnormal or we need to change your treatment, we will call you to review the results.   Testing/Procedures: NONE    Follow-Up: At [facility], you and your health needs are our priority.  As part of our continuing mission to provide you with exceptional heart care, we have created designated Provider Care Teams.  These Care Teams include your primary Cardiologist (physician) and Advanced Practice Providers (APPs -  Physician Assistants and Nurse Practitioners) who all work together to provide you with the care you need, when you need it.  We recommend signing up for the patient portal called "MyChart".  Sign up information is provided on this After Visit Summary.  MyChart is used to connect with patients for Virtual Visits (Telemedicine).  Patients are able to view lab/test results, encounter notes, upcoming appointments, etc.  Non-urgent messages can be sent to your provider as well.   To learn more about what you can do with MyChart, go to [email or web address].    Your next appointment:   1 year(s)  The format for your next appointment:   In Person  Provider:   Peter Nishan, MD   Other Instructions Thank you for choosing [facility]!    

## 2020-11-26 ENCOUNTER — Ambulatory Visit: Payer: Medicare Other | Admitting: Urology

## 2020-11-28 ENCOUNTER — Other Ambulatory Visit: Payer: Self-pay

## 2020-11-28 ENCOUNTER — Telehealth (INDEPENDENT_AMBULATORY_CARE_PROVIDER_SITE_OTHER): Payer: Medicare Other | Admitting: Urology

## 2020-11-28 DIAGNOSIS — N401 Enlarged prostate with lower urinary tract symptoms: Secondary | ICD-10-CM

## 2020-11-28 DIAGNOSIS — R339 Retention of urine, unspecified: Secondary | ICD-10-CM

## 2020-11-28 DIAGNOSIS — N131 Hydronephrosis with ureteral stricture, not elsewhere classified: Secondary | ICD-10-CM | POA: Diagnosis not present

## 2020-11-28 DIAGNOSIS — N138 Other obstructive and reflux uropathy: Secondary | ICD-10-CM | POA: Diagnosis not present

## 2020-11-28 MED ORDER — TAMSULOSIN HCL 0.4 MG PO CAPS: 0.4000 mg | ORAL_CAPSULE | Freq: Every day | ORAL | 11 refills | Status: DC

## 2020-11-28 NOTE — Progress Notes (Signed)
11/28/2020 12:44 PM   Cristian Henderson 05-15-1945 [id number]  Referring provider: [name], MD [address] [place],  [place] [address]  Patient location: home Physician location: office I connected with  [name] on 11/28/20 by a video enabled telemedicine application and verified that I am speaking with the correct person using two identifiers.   I discussed the limitations of evaluation and management by telemedicine. The patient expressed understanding and agreed to proceed.    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 He has mild LUTS on flomax 0.4mg  daily. He underwent transplant renal US which showed mild fullness of the collecting system. PVR 33cc on the [place]. No other complaints today.    PMH: Past Medical History:  Diagnosis Date   Acid reflux    Anxiety    Arthritis    Atrial fibrillation (HCC)    Depression    Dysrhythmia    AFIB   Gout    High cholesterol    Hypertension    Kidney failure    PTSD (post-traumatic stress disorder)     Surgical History: Past Surgical History:  Procedure Laterality Date   ATRIAL FIBRILLATION ABLATION N/A 10/03/2020   Procedure: ATRIAL FIBRILLATION ABLATION;  Surgeon: Constance Haw, MD;  Location: [facility];  Service: Cardiovascular;  Laterality: N/A;   CYSTOSCOPY  08/07/2020   Procedure: CYSTOSCOPY, INSERTION OF FOLEY CATHETER;  Surgeon: Cleon Gustin, MD;  Location: [facility];  Service: Urology;;   KIDNEY TRANSPLANT Right 2013   REPLACEMENT TOTAL KNEE Right    SKIN CANCER EXCISION     TOTAL SHOULDER REPLACEMENT Left     Home Medications:  Allergies as of 11/28/2020       Reactions   Doxycycline Hives   Doxycycline Hyclate Hives   Simvastatin Other (See Comments)   Pt states he loss all use of muscles and he was in the hospital for 10 days         Medication List         Accurate as of November 28, 2020 12:44 PM. If you have any questions, ask your nurse or doctor.          acetaminophen 500 MG tablet Commonly known as: TYLENOL Take 1,000 mg by mouth every 6 (six) hours as needed for mild pain or moderate pain (Back pain).   allopurinol 100 MG tablet Commonly known as: ZYLOPRIM Take 0.5 tablets (50 mg total) by mouth daily.   cholecalciferol 25 MCG (1000 UNIT) tablet Commonly known as: VITAMIN D3 Take 1,000 Units by mouth in the morning and at bedtime.   cycloSPORINE modified 25 MG capsule Commonly known as: NEORAL Take 75 mg by mouth 2 (two) times daily.   diltiazem 240 MG 24 hr capsule Commonly known as: DILACOR XR Take 240 mg by mouth daily.   escitalopram 10 MG tablet Commonly known as: Lexapro Take 1 tablet (10 mg total) by mouth daily.   ezetimibe 10 MG tablet Commonly known as: ZETIA Take 1 tablet (10 mg total) by mouth daily.   Fish Oil 1200 MG Caps Take 2,400 mg by mouth in the morning and at bedtime.   mycophenolate 180 MG EC tablet Commonly known as: MYFORTIC Take 180 mg by mouth 2 (two) times daily.   omeprazole 40 MG capsule Commonly known as: PRILOSEC Take 40 mg by mouth daily.   Rivaroxaban 15 MG Tabs tablet  Commonly known as: XARELTO Take 15 mg by mouth daily with breakfast.   sodium bicarbonate 650 MG tablet Take 650 mg by mouth 2 (two) times daily.   tamsulosin 0.4 MG Caps capsule Commonly known as: FLOMAX Take 0.4 mg by mouth daily.        Allergies:  Allergies  Allergen Reactions   Doxycycline Hives   Doxycycline Hyclate Hives   Simvastatin Other (See Comments)    Pt states he loss all use of muscles and he was in the hospital for 10 days     Family History: Family History  Problem Relation Age of Onset   Alzheimer's disease Mother    Diabetes Mother     Social History:  reports that he has never smoked. He has been exposed to tobacco smoke. He has never used smokeless tobacco. He reports  that he does not currently use alcohol. He reports that he does not use drugs.  ROS: All other review of systems were reviewed and are negative except what is noted above in HPI   Laboratory Data: Lab Results  Component Value Date   WBC 5.8 09/24/2020   HGB 11.2 (L) 09/24/2020   HCT 32.9 (L) 09/24/2020   MCV 88 09/24/2020   PLT 182 09/24/2020    Lab Results  Component Value Date   CREATININE 1.47 (H) 09/24/2020    No results found for: PSA  No results found for: TESTOSTERONE  No results found for: HGBA1C  Urinalysis    Component Value Date/Time   COLORURINE YELLOW 08/06/2020 2044   APPEARANCEUR Clear 08/20/2020 1327   LABSPEC 1.017 08/06/2020 2044   PHURINE 5.0 08/06/2020 2044   GLUCOSEU Negative 08/20/2020 1327   HGBUR LARGE (A) 08/06/2020 2044   BILIRUBINUR Negative 08/20/2020 1327   KETONESUR NEGATIVE 08/06/2020 2044   PROTEINUR 1+ (A) 08/20/2020 1327   PROTEINUR 30 (A) 08/06/2020 2044   NITRITE Negative 08/20/2020 1327   NITRITE POSITIVE (A) 08/06/2020 2044   LEUKOCYTESUR Negative 08/20/2020 1327   LEUKOCYTESUR MODERATE (A) 08/06/2020 2044    Lab Results  Component Value Date   LABMICR See below: 08/20/2020   WBCUA 0-5 08/20/2020   LABEPIT None seen 08/20/2020   MUCUS Present 07/09/2020   BACTERIA None seen 08/20/2020    Pertinent Imaging: Renal US 11/19/2020: Images reviewed and discussed with the patient No results found for this or any previous visit.  No results found for this or any previous visit.  No results found for this or any previous visit.  No results found for this or any previous visit.  Results for orders placed during the hospital encounter of 08/06/20  US RENAL  Narrative CLINICAL DATA:  Hydronephrosis of transplant kidney. Ureteral orifice could not be cannulated for retrograde stent placement cystoscopically.  EXAM: RENAL / URINARY TRACT ULTRASOUND COMPLETE  COMPARISON:  07/28/2020 CT  FINDINGS: Transplant  kidney:  Location: Right iliac fossa. Renal measurements: 11.5 x 7.8 x 5.4 = volume: 254 mL. Echogenicity within normal limits. No mass visualized. There is moderate hydronephrosis.  Right Kidney:  Renal measurements: 9 x 4.8 x 4.4 = volume: 97 mL. Echogenic renal parenchyma. No hydronephrosis. 2 subcentimeter cystic appearing lesions in the lower pole.  Left Kidney:  Renal measurements: 9.7 x 7.3 x 6.6 = volume: 244 mL. Echogenic parenchyma. No hydronephrosis. 6.4 cm cystic lesion in the lower pole.  Bladder:  Decompressed by Foley catheter.  Other:  None.  IMPRESSION: 1. Moderate hydronephrosis of the transplant kidney in the right iliac fossa.  2. Bilateral native renal atrophy with cystic lesions, no hydronephrosis.   Electronically Signed By: Lucrezia Europe M.D. On: 08/08/2020 09:51  No results found for this or any previous visit.  No results found for this or any previous visit.  Results for orders placed during the hospital encounter of 07/28/20  CT Renal Stone Study  Narrative CLINICAL DATA:  Flank pain, kidney stone suspected  Patient reports he is unable to void. Renal transplant 2013. Right lower quadrant and left upper abdominal pain today.  EXAM: CT ABDOMEN AND PELVIS WITHOUT CONTRAST  TECHNIQUE: Multidetector CT imaging of the abdomen and pelvis was performed following the standard protocol without IV contrast.  COMPARISON:  Renal transplant ultrasound 11/26/2020  FINDINGS: Lower chest: Upper normal heart size. There are coronary artery calcifications. No pleural fluid or acute airspace disease.  Hepatobiliary: Borderline hepatic steatosis. No discrete focal hepatic lesion on noncontrast exam. Decompressed gallbladder. No calcified gallstone. No biliary dilatation.  Pancreas: No ductal dilatation or inflammation.  Spleen: Normal in size without focal abnormality.  Adrenals/Urinary Tract: Normal adrenal glands. Marked bilateral native  renal atrophy. Low-density lesions arising from both native kidneys measure or simple fluid density and are consistent with cysts. Largest arises from the posterior mid left kidney measures 6.5 cm. No hydronephrosis or ureteral dilatation.  Right lower quadrant renal transplant. Mild transplant hydronephrosis but no ureteral dilatation. Minor transplant perinephric edema. No transplant calculi. Possible cyst in the lower pole of the renal transplant, but not well assessed on this noncontrast exam. The urinary bladder is partially distended, no bladder wall thickening or stone.  Stomach/Bowel: Unremarkable stomach. Small bowel primarily decompressed. No obstruction or inflammation. Normal appendix. Moderate colonic stool burden. No colonic wall thickening or inflammation. Occasional left colonic diverticula without diverticulitis.  Vascular/Lymphatic: Moderate aortic and branch atherosclerosis. No aortic aneurysm no abdominopelvic adenopathy.  Reproductive: Enlarged spanning 5.5 cm transverse.  Other: Postsurgical change of the anterior abdominal wall. Tiny fat containing umbilical hernia. Minimal fat in both inguinal canals. No ascites, free air, or abdominopelvic fluid collection.  Musculoskeletal: Degenerative disc disease at L4-L5 and L5-S1. There are no acute or suspicious osseous abnormalities.  IMPRESSION: 1. Right lower quadrant renal transplant. Mild transplant hydronephrosis but no urolithiasis or cause for obstruction. Minor transplant perinephric edema. The degree of hydronephrosis appears improved from ultrasound earlier this year. 2. Marked bilateral native renal atrophy with bilateral renal cysts. 3. Enlarged prostate spanning 5.5 cm.  Aortic Atherosclerosis (ICD10-I70.0).   Electronically Signed By: Keith Rake M.D. On: 07/28/2020 15:58   Assessment & Plan:    1. Hydronephrosis with ureteral stricture, not elsewhere classified -RTC 6 months with  renal US  2. Benign prostatic hyperplasia with urinary obstruction -Continue flomax 0.4mg  daily  3. Incomplete emptying of bladder Continue flomax 0.4mg  daily   No follow-ups on file.  Nicolette Bang, MD  [facility] [place]

## 2020-12-02 ENCOUNTER — Encounter: Payer: Self-pay | Admitting: Urology

## 2020-12-02 NOTE — Patient Instructions (Signed)
Hydronephrosis ?Hydronephrosis is the swelling of one or both kidneys due to a blockage that stops urine from flowing out of the body. Kidneys filter waste from the blood and produce urine. This condition can lead to kidney failure and may become life-threatening if not treated promptly. ?What are the causes? ?In infants and children, common causes include problems that occur when a baby is developing in the womb. These can include problems in the kidneys or in the tubes that drain urine into the bladder (ureters). ?In adults, common causes include: ?Kidney stones. ?Pregnancy. ?A tumor or cyst in the abdomen or pelvis. ?An enlarged prostate gland. ?Other causes include: ?Bladder infection. ?Scar tissue from a previous surgery or injury. ?A blood clot. ?Cancer of the prostate, bladder, uterus, ovary, or colon. ?What are the signs or symptoms? ?Symptoms of this condition include: ?Pain or discomfort in your side (flank) or abdomen. ?Swelling in your abdomen. ?Nausea and vomiting. ?Fever. ?Pain when passing urine. ?Feelings of urgency when you need to urinate. ?Urinating more often than normal. ?In some cases, you may not have any symptoms. ?How is this diagnosed? ?This condition may be diagnosed based on: ?Your symptoms and medical history. ?A physical exam. ?Blood and urine tests. ?Imaging tests, such as an ultrasound, CT scan, or MRI. ?A procedure to look at your urinary tract and bladder by inserting a scope into the urethra (cystoscopy). ?How is this treated? ?Treatment for this condition depends on where the blockage is, how long it has been there, and what caused it. The goal of treatment is to remove the blockage. Treatment may include: ?Antibiotic medicines to treat or prevent infection. ?A procedure to place a small, thin tube (stent) into a blocked ureter. The stent will keep the ureter open so that urine can drain through it. ?A nonsurgical procedure that crushes kidney stones with shock waves  (extracorporeal shock wave lithotripsy). ?If kidney failure occurs, treatment may include dialysis or a kidney transplant. ?Follow these instructions at home: ? ?Take over-the-counter and prescription medicines only as told by your health care provider. ?If you were prescribed an antibiotic medicine, take it exactly as told by your health care provider. Do not stop taking the antibiotic even if you start to feel better. ?Rest and return to your normal activities as told by your health care provider. Ask your health care provider what activities are safe for you. ?Drink enough fluid to keep your urine pale yellow. ?Keep all follow-up visits. This is important. ?Contact a health care provider if: ?You continue to have symptoms after treatment. ?You develop new symptoms. ?Your urine becomes cloudy or bloody. ?You have a fever. ?Get help right away if: ?You have severe flank or abdominal pain. ?You cannot drink fluids without vomiting. ?Summary ?Hydronephrosis is the swelling of one or both kidneys due to a blockage that stops urine from flowing out of the body. ?Hydronephrosis can lead to kidney failure and may become life-threatening if not treated promptly. ?The goal of treatment is to remove the blockage. It may include a procedure to insert a stent into a blocked ureter, a procedure to break up kidney stones, or taking antibiotic medicines. ?Follow your health care provider's instructions for taking care of yourself at home, including instructions about drinking fluids, taking medicines, and limiting activities. ?This information is not intended to replace advice given to you by your health care provider. Make sure you discuss any questions you have with your health care provider. ?Document Revised: 07/10/2019 Document Reviewed: 07/10/2019 ?Elsevier Patient   Education ? 2022 [employer]vier Inc[employer] ? ?

## 2020-12-24 ENCOUNTER — Ambulatory Visit (INDEPENDENT_AMBULATORY_CARE_PROVIDER_SITE_OTHER): Payer: Medicare Other | Admitting: Internal Medicine

## 2020-12-24 ENCOUNTER — Other Ambulatory Visit: Payer: Self-pay

## 2020-12-24 ENCOUNTER — Encounter: Payer: Self-pay | Admitting: Internal Medicine

## 2020-12-24 VITALS — BP 147/71 | HR 70 | Temp 97.7°F | Resp 18 | Ht 65.0 in | Wt 164.0 lb

## 2020-12-24 DIAGNOSIS — M7021 Olecranon bursitis, right elbow: Secondary | ICD-10-CM | POA: Diagnosis not present

## 2020-12-24 DIAGNOSIS — Z23 Encounter for immunization: Secondary | ICD-10-CM

## 2020-12-24 MED ORDER — PREDNISONE 20 MG PO TABS: 20.0000 mg | ORAL_TABLET | Freq: Every day | ORAL | 0 refills | Status: DC

## 2020-12-24 NOTE — Progress Notes (Signed)
Acute Office Visit  Subjective:    Patient ID: Cristian Henderson, male    DOB: 24-Aug-1945, 75 y.o.   MRN: [id number]  Chief Complaint  Patient presents with   Elbow Injury    Bump on elbow painful since Saturday swelling     HPI Patient is in today for evaluation of right elbow swelling for last 4 days. He has right elbow pain, which is constant, worse with movement and was progressing till this morning. He denies any recent injury. Denies any recent insect bite.  He received flu vaccine in the office today.  Past Medical History:  Diagnosis Date   Acid reflux    Anxiety    Arthritis    Atrial fibrillation (HCC)    Depression    Dysrhythmia    AFIB   Gout    High cholesterol    Hypertension    Kidney failure    PTSD (post-traumatic stress disorder)     Past Surgical History:  Procedure Laterality Date   ATRIAL FIBRILLATION ABLATION N/A 10/03/2020   Procedure: ATRIAL FIBRILLATION ABLATION;  Surgeon: Constance Haw, MD;  Location: [facility];  Service: Cardiovascular;  Laterality: N/A;   CYSTOSCOPY  08/07/2020   Procedure: CYSTOSCOPY, INSERTION OF FOLEY CATHETER;  Surgeon: Cleon Gustin, MD;  Location: [facility];  Service: Urology;;   KIDNEY TRANSPLANT Right 2013   REPLACEMENT TOTAL KNEE Right    SKIN CANCER EXCISION     TOTAL SHOULDER REPLACEMENT Left     Family History  Problem Relation Age of Onset   Alzheimer's disease Mother    Diabetes Mother     Social History   Socioeconomic History   Marital status: Married    Spouse name: Not on file   Number of children: Not on file   Years of education: Not on file   Highest education level: Not on file  Occupational History   Not on file  Tobacco Use   Smoking status: Never    Passive exposure: Past   Smokeless tobacco: Never  Vaping Use   Vaping Use: Never used  Substance and Sexual Activity   Alcohol use: Not Currently   Drug use: Never   Sexual activity: Not Currently  Other Topics  Concern   Not on file  Social History Narrative   Not on file   Social Determinants of Health   Financial Resource Strain: Low Risk    Difficulty of Paying Living Expenses: Not hard at all  Food Insecurity: No Food Insecurity   Worried About Charity fundraiser in the Last Year: Never true   [place] in the Last Year: Never true  Transportation Needs: No Transportation Needs   Lack of Transportation (Medical): No   Lack of Transportation (Non-Medical): No  Physical Activity: Insufficiently Active   Days of Exercise per Week: 3 days   Minutes of Exercise per Session: 30 min  Stress: No Stress Concern Present   Feeling of Stress : Not at all  Social Connections: Moderately Isolated   Frequency of Communication with Friends and Family: More than three times a week   Frequency of Social Gatherings with Friends and Family: Once a week   Attends Religious Services: Never   Marine scientist or Organizations: No   Attends Archivist Meetings: Never   Marital Status: Married  Human resources officer Violence: Not At Risk   Fear of Current or Ex-Partner: No   Emotionally Abused: No  Physically Abused: No   Sexually Abused: No    Outpatient Medications Prior to Visit  Medication Sig Dispense Refill   acetaminophen (TYLENOL) 500 MG tablet Take 1,000 mg by mouth every 6 (six) hours as needed for mild pain or moderate pain (Back pain).     allopurinol (ZYLOPRIM) 100 MG tablet Take 0.5 tablets (50 mg total) by mouth daily. 30 tablet 11   cholecalciferol (VITAMIN D3) 25 MCG (1000 UNIT) tablet Take 1,000 Units by mouth in the morning and at bedtime.     cycloSPORINE modified (NEORAL) 25 MG capsule Take 75 mg by mouth 2 (two) times daily.     diltiazem (DILACOR XR) 240 MG 24 hr capsule Take 240 mg by mouth daily.     escitalopram (LEXAPRO) 10 MG tablet Take 1 tablet (10 mg total) by mouth daily. 30 tablet 3   ezetimibe (ZETIA) 10 MG tablet Take 1 tablet (10 mg total) by mouth  daily. 90 tablet 1   mycophenolate (MYFORTIC) 180 MG EC tablet Take 180 mg by mouth 2 (two) times daily.     Omega-3 Fatty Acids (FISH OIL) 1200 MG CAPS Take 2,400 mg by mouth in the morning and at bedtime.     omeprazole (PRILOSEC) 40 MG capsule Take 40 mg by mouth daily.     Rivaroxaban (XARELTO) 15 MG TABS tablet Take 15 mg by mouth daily with breakfast.     sodium bicarbonate 650 MG tablet Take 650 mg by mouth 2 (two) times daily.     tamsulosin (FLOMAX) 0.4 MG CAPS capsule Take 1 capsule (0.4 mg total) by mouth daily. 30 capsule 11   No facility-administered medications prior to visit.    Allergies  Allergen Reactions   Doxycycline Hives   Doxycycline Hyclate Hives   Simvastatin Other (See Comments)    Pt states he loss all use of muscles and he was in the hospital for 10 days     Review of Systems  Constitutional:  Negative for chills and fever.  HENT:  Negative for congestion and sore throat.   Eyes:  Negative for pain and discharge.  Respiratory:  Negative for cough and shortness of breath.   Cardiovascular:  Positive for palpitations (Intermittent). Negative for chest pain.  Gastrointestinal:  Negative for constipation, diarrhea, nausea and vomiting.  Endocrine: Negative for polydipsia and polyuria.  Genitourinary:  Negative for dysuria and hematuria.  Musculoskeletal:  Positive for joint swelling (R elbow). Negative for neck pain and neck stiffness.  Skin:  Negative for rash.  Neurological:  Negative for dizziness, weakness, numbness and headaches.  Psychiatric/Behavioral:  Negative for agitation and behavioral problems.       Objective:    Physical Exam Vitals reviewed.  Constitutional:      General: He is not in acute distress.    Appearance: He is not diaphoretic.  HENT:     Head: Normocephalic and atraumatic.     Nose: Nose normal.     Mouth/Throat:     Mouth: Mucous membranes are moist.  Eyes:     General: No scleral icterus.    Extraocular Movements:  Extraocular movements intact.  Cardiovascular:     Rate and Rhythm: Normal rate and regular rhythm.     Pulses: Normal pulses.     Heart sounds: Normal heart sounds. No murmur heard. Pulmonary:     Breath sounds: Normal breath sounds. No wheezing or rales.  Musculoskeletal:        General: Tenderness (Right elbow, with swelling) present.  Cervical back: Neck supple. No tenderness.     Right lower leg: No edema.     Left lower leg: No edema.  Skin:    General: Skin is warm.     Findings: No rash.  Neurological:     General: No focal deficit present.     Mental Status: He is alert and oriented to person, place, and time.  Psychiatric:        Mood and Affect: Mood normal.        Behavior: Behavior normal.    BP (!) 147/71 (BP Location: Left Arm, Patient Position: Sitting, Cuff Size: Normal)   Pulse 70   Temp 97.7 F (36.5 C) (Oral)   Resp 18   Ht $R'5\' 5"'nz$  (1.651 m)   Wt 164 lb 0.6 oz (74.4 kg)   SpO2 97%   BMI 27.30 kg/m  Wt Readings from Last 3 Encounters:  12/24/20 164 lb 0.6 oz (74.4 kg)  11/21/20 164 lb (74.4 kg)  10/31/20 163 lb 3.2 oz (74 kg)    Health Maintenance Due  Topic Date Due   Hepatitis C Screening  Never done   TETANUS/TDAP  Never done   Zoster Vaccines- Shingrix (1 of 2) Never done   COLONOSCOPY (Pts 45-80yrs Insurance coverage will need to be confirmed)  Never done   COVID-19 Vaccine (4 - Booster for Moderna series) 03/13/2020    There are no preventive care reminders to display for this patient.   No results found for: TSH Lab Results  Component Value Date   WBC 5.8 09/24/2020   HGB 11.2 (L) 09/24/2020   HCT 32.9 (L) 09/24/2020   MCV 88 09/24/2020   PLT 182 09/24/2020   Lab Results  Component Value Date   NA 138 09/24/2020   K 4.5 09/24/2020   CO2 21 09/24/2020   GLUCOSE 131 (H) 09/24/2020   BUN 26 09/24/2020   CREATININE 1.47 (H) 09/24/2020   BILITOT 0.5 08/09/2020   ALKPHOS 60 08/09/2020   AST 12 (L) 08/09/2020   ALT 12  08/09/2020   PROT 6.0 (L) 08/09/2020   ALBUMIN 2.9 (L) 08/09/2020   CALCIUM 9.4 09/24/2020   ANIONGAP 9 08/09/2020   EGFR 49 (L) 09/24/2020   No results found for: CHOL No results found for: HDL No results found for: LDLCALC No results found for: TRIG No results found for: CHOLHDL No results found for: HGBA1C     Assessment & Plan:   Problem List Items Addressed This Visit    Visit Diagnoses     Olecranon bursitis of right elbow    -  Primary Could be due to overuse injury during yardwork or household work Heating pad and/or ice application Prednisone 20 mg QD X 5 days as he can't take NSAIDs due to chronic anticoagulation and CKD    Relevant Medications   predniSONE (DELTASONE) 20 MG tablet   Need for immunization against influenza       Relevant Orders   Flu Vaccine QUAD High Dose(Fluad) (Completed)        Meds ordered this encounter  Medications   predniSONE (DELTASONE) 20 MG tablet    Sig: Take 1 tablet (20 mg total) by mouth daily with breakfast.    Dispense:  5 tablet    Refill:  0     Tiger Spieker Keith Rake, MD

## 2021-01-01 ENCOUNTER — Ambulatory Visit: Payer: Medicare Other | Admitting: Cardiology

## 2021-01-15 ENCOUNTER — Other Ambulatory Visit: Payer: Self-pay

## 2021-01-15 ENCOUNTER — Encounter: Payer: Self-pay | Admitting: Cardiology

## 2021-01-15 ENCOUNTER — Ambulatory Visit (INDEPENDENT_AMBULATORY_CARE_PROVIDER_SITE_OTHER): Payer: Medicare Other | Admitting: Cardiology

## 2021-01-15 VITALS — BP 122/62 | HR 61 | Ht 65.0 in | Wt 164.6 lb

## 2021-01-15 DIAGNOSIS — I480 Paroxysmal atrial fibrillation: Secondary | ICD-10-CM | POA: Diagnosis not present

## 2021-01-15 NOTE — Patient Instructions (Signed)
Medication Instructions:  Your physician recommends that you continue on your current medications as directed. Please refer to the Current Medication list given to you today.  *If you need a refill on your cardiac medications before your next appointment, please call your pharmacy*   Lab Work: None ordered   Testing/Procedures: None ordered   Follow-Up: At [facility], you and your health needs are our priority.  As part of our continuing mission to provide you with exceptional heart care, we have created designated Provider Care Teams.  These Care Teams include your primary Cardiologist (physician) and Advanced Practice Providers (APPs -  Physician Assistants and Nurse Practitioners) who all work together to provide you with the care you need, when you need it.  Your next appointment:   3 month(s)  The format for your next appointment:   In Person  Provider:   Will Camnitz, MD    Thank you for choosing [facility]!!   Jaquawn Saffran, RN [phone]     

## 2021-01-15 NOTE — Progress Notes (Signed)
Electrophysiology Office Note   Date:  01/15/2021   ID:  Cristian Henderson, DOB [birth year], MRN [id number]  PCP:  [name], MD  Cardiologist:  Johnsie Cancel Primary Electrophysiologist:  Asiya Cutbirth Meredith Leeds, MD    Chief Complaint: AF   History of Present Illness: Cristian Henderson is a 75 y.o. male who is being seen today for the evaluation of AF at the request of [name], MD. Presenting today for electrophysiology evaluation.  She is significant for atrial fibrillation, hypertension, gout, PTSD.  He is status post renal transplant in 2013.  He was admitted to the hospital June 2020 with heart failure and new onset atrial fibrillation.  He converted to sinus rhythm with Cardizem.  He was readmitted 01/12/2019 with junctional rhythm and hypokalemia as well as worsening renal dysfunction.  He is now status post atrial fibrillation ablation 10/03/2020.  Today, denies symptoms of palpitations, chest pain, shortness of breath, orthopnea, PND, lower extremity edema, claudication, dizziness, presyncope, syncope, bleeding, or neurologic sequela. The patient is tolerating medications without difficulties.  Since his ablation he has done well.  He is noted no further episodes of atrial fibrillation.  He is able to do all of his daily activities without restriction.  He is overall happy with his control.   Past Medical History:  Diagnosis Date   Acid reflux    Anxiety    Arthritis    Atrial fibrillation (HCC)    Depression    Dysrhythmia    AFIB   Gout    High cholesterol    Hypertension    Kidney failure    PTSD (post-traumatic stress disorder)    Past Surgical History:  Procedure Laterality Date   ATRIAL FIBRILLATION ABLATION N/A 10/03/2020   Procedure: ATRIAL FIBRILLATION ABLATION;  Surgeon: Constance Haw, MD;  Location: [facility];  Service: Cardiovascular;  Laterality: N/A;   CYSTOSCOPY  08/07/2020   Procedure: CYSTOSCOPY, INSERTION OF FOLEY CATHETER;  Surgeon:  Cleon Gustin, MD;  Location: [facility];  Service: Urology;;   KIDNEY TRANSPLANT Right 2013   REPLACEMENT TOTAL KNEE Right    SKIN CANCER EXCISION     TOTAL SHOULDER REPLACEMENT Left      Current Outpatient Medications  Medication Sig Dispense Refill   acetaminophen (TYLENOL) 500 MG tablet Take 1,000 mg by mouth every 6 (six) hours as needed for mild pain or moderate pain (Back pain).     allopurinol (ZYLOPRIM) 100 MG tablet Take 0.5 tablets (50 mg total) by mouth daily. 30 tablet 11   cholecalciferol (VITAMIN D3) 25 MCG (1000 UNIT) tablet Take 1,000 Units by mouth in the morning and at bedtime.     cycloSPORINE modified (NEORAL) 25 MG capsule Take 75 mg by mouth 2 (two) times daily.     diltiazem (DILACOR XR) 240 MG 24 hr capsule Take 240 mg by mouth daily.     escitalopram (LEXAPRO) 10 MG tablet Take 1 tablet (10 mg total) by mouth daily. 30 tablet 3   ezetimibe (ZETIA) 10 MG tablet Take 1 tablet (10 mg total) by mouth daily. 90 tablet 1   mycophenolate (MYFORTIC) 180 MG EC tablet Take 180 mg by mouth 2 (two) times daily.     Omega-3 Fatty Acids (FISH OIL) 1200 MG CAPS Take 2,400 mg by mouth in the morning and at bedtime.     omeprazole (PRILOSEC) 40 MG capsule Take 40 mg by mouth daily.     predniSONE (DELTASONE) 20 MG tablet Take 1 tablet (  20 mg total) by mouth daily with breakfast. 5 tablet 0   Rivaroxaban (XARELTO) 15 MG TABS tablet Take 15 mg by mouth daily with breakfast.     sodium bicarbonate 650 MG tablet Take 650 mg by mouth 2 (two) times daily.     tamsulosin (FLOMAX) 0.4 MG CAPS capsule Take 1 capsule (0.4 mg total) by mouth daily. 30 capsule 11   No current facility-administered medications for this visit.    Allergies:   Doxycycline, Doxycycline hyclate, and Simvastatin   Social History:  The patient  reports that he has never smoked. He has been exposed to tobacco smoke. He has never used smokeless tobacco. He reports that he does not currently use alcohol. He  reports that he does not use drugs.   Family History:  The patient's family history includes Alzheimer's disease in his mother; Diabetes in his mother.   ROS:  Please see the history of present illness.   Otherwise, review of systems is positive for none.   All other systems are reviewed and negative.   PHYSICAL EXAM: VS:  BP 122/62   Pulse 61   Ht 5\' 5"  (1.651 m)   Wt 164 lb 9.6 oz (74.7 kg)   SpO2 97%   BMI 27.39 kg/m  , BMI Body mass index is 27.39 kg/m. GEN: Well nourished, well developed, in no acute distress  HEENT: normal  Neck: no JVD, carotid bruits, or masses Cardiac: RRR; no murmurs, rubs, or gallops,no edema  Respiratory:  clear to auscultation bilaterally, normal work of breathing GI: soft, nontender, nondistended, + BS MS: no deformity or atrophy  Skin: warm and dry Neuro:  Strength and sensation are intact Psych: euthymic mood, full affect  EKG:  EKG is ordered today. Personal review of the ekg ordered shows this rhythm, rate 61  Recent Labs: 08/09/2020: ALT 12; Magnesium 1.8 09/24/2020: BUN 26; Creatinine, Ser 1.47; Hemoglobin 11.2; Platelets 182; Potassium 4.5; Sodium 138    Lipid Panel  No results found for: CHOL, TRIG, HDL, CHOLHDL, VLDL, LDLCALC, LDLDIRECT   Wt Readings from Last 3 Encounters:  01/15/21 164 lb 9.6 oz (74.7 kg)  12/24/20 164 lb 0.6 oz (74.4 kg)  11/21/20 164 lb (74.4 kg)      Other studies Reviewed: Additional studies/ records that were reviewed today include: Cardiac monitor 08/06/2020 personally reviewed Review of the above records today demonstrates:  Frequent bouts of rapid PAF/Flutter/Fib that Correlate with symptoms of dyspnea and palpitations  TTE 06/05/2020  1. Left ventricular ejection fraction, by estimation, is 55 to 60%. The  left ventricle has normal function. The left ventricle has no regional  wall motion abnormalities. There is mild left ventricular hypertrophy.  Left ventricular diastolic parameters  were normal.    2. Right ventricular systolic function is normal. The right ventricular  size is normal.   3. The mitral valve is normal in structure. Trivial mitral valve  regurgitation. No evidence of mitral stenosis.   4. The aortic valve is tricuspid. Aortic valve regurgitation is mild to  moderate. No aortic stenosis is present.   5. The inferior vena cava is normal in size with greater than 50%  respiratory variability, suggesting right atrial pressure of 3 mmHg.   ASSESSMENT AND PLAN:  1.  Paroxysmal atrial fibrillation: Currently on Xarelto 15 mg daily, diltiazem 240 mg daily.  CHA2DS2-VASc of 3.  He is status post ablation 10/03/2020.  He has had no further episodes of atrial fibrillation.  We Kinnedy Mongiello continue with current  management.  2.  Hypertension: Currently well controlled  3.  Junctional bradycardia: Currently asymptomatic and doing well.  In sinus rhythm today.   Current medicines are reviewed at length with the patient today.   The patient does not have concerns regarding his medicines.  The following changes were made today: None  Labs/ tests ordered today include:  Orders Placed This Encounter  Procedures   EKG 12-Lead      Disposition:   FU with [place] 3 months  Signed, Brazil Voytko Meredith Leeds, MD  01/15/2021 2:51 PM     [facility] [address] [place] [address] [phone] (office) [phone] (fax)

## 2021-02-04 ENCOUNTER — Encounter (INDEPENDENT_AMBULATORY_CARE_PROVIDER_SITE_OTHER): Payer: Self-pay | Admitting: *Deleted

## 2021-03-05 ENCOUNTER — Ambulatory Visit: Payer: Medicare Other | Admitting: Internal Medicine

## 2021-03-26 ENCOUNTER — Ambulatory Visit: Payer: Medicare Other | Admitting: Internal Medicine

## 2021-04-24 ENCOUNTER — Other Ambulatory Visit: Payer: Self-pay

## 2021-04-24 ENCOUNTER — Ambulatory Visit (INDEPENDENT_AMBULATORY_CARE_PROVIDER_SITE_OTHER): Payer: Medicare Other | Admitting: Cardiology

## 2021-04-24 ENCOUNTER — Encounter: Payer: Self-pay | Admitting: Cardiology

## 2021-04-24 VITALS — BP 124/68 | HR 56 | Ht 65.0 in | Wt 168.0 lb

## 2021-04-24 DIAGNOSIS — R4781 Slurred speech: Secondary | ICD-10-CM | POA: Diagnosis not present

## 2021-04-24 DIAGNOSIS — I480 Paroxysmal atrial fibrillation: Secondary | ICD-10-CM | POA: Diagnosis not present

## 2021-04-24 NOTE — Patient Instructions (Signed)
Medication Instructions:  Your physician recommends that you continue on your current medications as directed. Please refer to the Current Medication list given to you today.  *If you need a refill on your cardiac medications before your next appointment, please call your pharmacy*   Lab Work: None ordered If you have labs (blood work) drawn today and your tests are completely normal, you will receive your results only by: [place] (if you have MyChart) OR A paper copy in the mail If you have any lab test that is abnormal or we need to change your treatment, we will call you to review the results.   Testing/Procedures: None ordered   Follow-Up: At [facility], you and your health needs are our priority.  As part of our continuing mission to provide you with exceptional heart care, we have created designated Provider Care Teams.  These Care Teams include your primary Cardiologist (physician) and Advanced Practice Providers (APPs -  Physician Assistants and Nurse Practitioners) who all work together to provide you with the care you need, when you need it.  We recommend signing up for the patient portal called "MyChart".  Sign up information is provided on this After Visit Summary.  MyChart is used to connect with patients for Virtual Visits (Telemedicine).  Patients are able to view lab/test results, encounter notes, upcoming appointments, etc.  Non-urgent messages can be sent to your provider as well.   To learn more about what you can do with MyChart, go to [email or web address].    Your next appointment:   6 month(s)  The format for your next appointment:   In Person  Provider:   Allegra Lai, MD  You have been referred to neurology    Thank you for choosing [facility]!!   Trinidad Curet, RN [phone]

## 2021-04-24 NOTE — Progress Notes (Signed)
Electrophysiology Office Note   Date:  04/24/2021   ID:  Cristian Henderson, DOB May 22, 1945, MRN [id number]  PCP:  [name], MD  Cardiologist:  Johnsie Cancel Primary Electrophysiologist:  Cassia Fein Meredith Leeds, MD    Chief Complaint: AF   History of Present Illness: Cristian Henderson is a 76 y.o. male who is being seen today for the evaluation of AF at the request of [name], MD. Presenting today for electrophysiology evaluation.  He has a history significant for atrial fibrillation, hypertension, gout, PTSD.  He is status post renal transplant in 2013.  He was admitted to the hospital June 2020 with heart failure and new onset atrial fibrillation.  He converted to sinus rhythm with Cardizem.  He was readmitted 01/12/2019 with junctional rhythm and hypokalemia as well as worsening renal dysfunction.  He is now status post atrial fibrillation ablation 10/03/2020.  Today, denies symptoms of palpitations, chest pain, shortness of breath, orthopnea, PND, lower extremity edema, claudication, dizziness, presyncope, syncope, bleeding. The patient is tolerating medications without difficulties.  He has noted no further episodes of atrial fibrillation.  A few days ago, he noted some word finding difficulties and confusion.  This lasted for a few hours.  It went away and he was back to his normal health.  Both he and his wife are quite concerned about this as this had never happened before.  He had no palpitations at the time.   Past Medical History:  Diagnosis Date   Acid reflux    Anxiety    Arthritis    Atrial fibrillation (HCC)    Depression    Dysrhythmia    AFIB   Gout    High cholesterol    Hypertension    Kidney failure    PTSD (post-traumatic stress disorder)    Past Surgical History:  Procedure Laterality Date   ATRIAL FIBRILLATION ABLATION N/A 10/03/2020   Procedure: ATRIAL FIBRILLATION ABLATION;  Surgeon: Constance Haw, MD;  Location: [facility];  Service:  Cardiovascular;  Laterality: N/A;   CYSTOSCOPY  08/07/2020   Procedure: CYSTOSCOPY, INSERTION OF FOLEY CATHETER;  Surgeon: Cleon Gustin, MD;  Location: [facility];  Service: Urology;;   KIDNEY TRANSPLANT Right 2013   REPLACEMENT TOTAL KNEE Right    SKIN CANCER EXCISION     TOTAL SHOULDER REPLACEMENT Left      Current Outpatient Medications  Medication Sig Dispense Refill   acetaminophen (TYLENOL) 500 MG tablet Take 1,000 mg by mouth every 6 (six) hours as needed for mild pain or moderate pain (Back pain).     allopurinol (ZYLOPRIM) 100 MG tablet Take 0.5 tablets (50 mg total) by mouth daily. 30 tablet 11   cholecalciferol (VITAMIN D3) 25 MCG (1000 UNIT) tablet Take 1,000 Units by mouth in the morning and at bedtime.     cycloSPORINE modified (NEORAL) 25 MG capsule Take 75 mg by mouth 2 (two) times daily.     diltiazem (DILACOR XR) 240 MG 24 hr capsule Take 240 mg by mouth daily.     escitalopram (LEXAPRO) 10 MG tablet Take 1 tablet (10 mg total) by mouth daily. 30 tablet 3   ezetimibe (ZETIA) 10 MG tablet Take 1 tablet (10 mg total) by mouth daily. 90 tablet 1   ferrous sulfate 325 (65 FE) MG EC tablet Take 1 tablet by mouth every morning.     mycophenolate (MYFORTIC) 180 MG EC tablet Take 180 mg by mouth 2 (two) times daily.  Omega-3 Fatty Acids (FISH OIL) 1200 MG CAPS Take 2,400 mg by mouth in the morning and at bedtime.     omeprazole (PRILOSEC) 40 MG capsule Take 40 mg by mouth daily.     predniSONE (DELTASONE) 20 MG tablet Take 1 tablet (20 mg total) by mouth daily with breakfast. 5 tablet 0   Rivaroxaban (XARELTO) 15 MG TABS tablet Take 15 mg by mouth daily with breakfast.     sodium bicarbonate 650 MG tablet Take 650 mg by mouth 2 (two) times daily.     tamsulosin (FLOMAX) 0.4 MG CAPS capsule Take 1 capsule (0.4 mg total) by mouth daily. 30 capsule 11   No current facility-administered medications for this visit.    Allergies:   Doxycycline, Doxycycline hyclate, and  Simvastatin   Social History:  The patient  reports that he has never smoked. He has been exposed to tobacco smoke. He has never used smokeless tobacco. He reports that he does not currently use alcohol. He reports that he does not use drugs.   Family History:  The patient's family history includes Alzheimer's disease in his mother; Diabetes in his mother.   ROS:  Please see the history of present illness.   Otherwise, review of systems is positive for none.   All other systems are reviewed and negative.   PHYSICAL EXAM: VS:  BP 124/68    Pulse (!) 56    Ht 5\' 5"  (1.651 m)    Wt 168 lb (76.2 kg)    SpO2 96%    BMI 27.96 kg/m  , BMI Body mass index is 27.96 kg/m. GEN: Well nourished, well developed, in no acute distress  HEENT: normal  Neck: no JVD, carotid bruits, or masses Cardiac: RRR; no murmurs, rubs, or gallops,no edema  Respiratory:  clear to auscultation bilaterally, normal work of breathing GI: soft, nontender, nondistended, + BS MS: no deformity or atrophy  Skin: warm and dry Neuro:  Strength and sensation are intact Psych: euthymic mood, full affect  EKG:  EKG is ordered today. Personal review of the ekg ordered shows sinus rhythm   Recent Labs: 08/09/2020: ALT 12; Magnesium 1.8 09/24/2020: BUN 26; Creatinine, Ser 1.47; Hemoglobin 11.2; Platelets 182; Potassium 4.5; Sodium 138    Lipid Panel  No results found for: CHOL, TRIG, HDL, CHOLHDL, VLDL, LDLCALC, LDLDIRECT   Wt Readings from Last 3 Encounters:  04/24/21 168 lb (76.2 kg)  01/15/21 164 lb 9.6 oz (74.7 kg)  12/24/20 164 lb 0.6 oz (74.4 kg)      Other studies Reviewed: Additional studies/ records that were reviewed today include: Cardiac monitor 08/06/2020 personally reviewed Review of the above records today demonstrates:  Frequent bouts of rapid PAF/Flutter/Fib that Correlate with symptoms of dyspnea and palpitations  TTE 06/05/2020  1. Left ventricular ejection fraction, by estimation, is 55 to 60%. The   left ventricle has normal function. The left ventricle has no regional  wall motion abnormalities. There is mild left ventricular hypertrophy.  Left ventricular diastolic parameters  were normal.   2. Right ventricular systolic function is normal. The right ventricular  size is normal.   3. The mitral valve is normal in structure. Trivial mitral valve  regurgitation. No evidence of mitral stenosis.   4. The aortic valve is tricuspid. Aortic valve regurgitation is mild to  moderate. No aortic stenosis is present.   5. The inferior vena cava is normal in size with greater than 50%  respiratory variability, suggesting right atrial pressure of 3  mmHg.   ASSESSMENT AND PLAN:  1.  Paroxysmal atrial fibrillation: Currently on Xarelto 15 mg daily, diltiazem 240 mg daily.  CHA2DS2-VASc of 3.  Status post ablation 10/03/2020.  Had no further episodes of atrial fibrillation to his knowledge.  We Deanda Ruddell continue with current management.  2.  Hypertension: Currently well controlled  3.  Junctional bradycardia: Currently asymptomatic.  In sinus rhythm.  No changes.  4.  Slurred speech with word finding difficulties: Could be due to a TIA versus stroke.  Kooper Chriswell refer to neurology.  Current medicines are reviewed at length with the patient today.   The patient does not have concerns regarding his medicines.  The following changes were made today: None  Labs/ tests ordered today include:  Orders Placed This Encounter  Procedures   Ambulatory referral to Neurology   EKG 12-Lead      Disposition:   FU with Jakyah Bradby 6 months  Signed, Sofhia Ulibarri Meredith Leeds, MD  04/24/2021 2:27 PM     [facility] [address] [place] [address] [phone] (office) [phone] (fax)

## 2021-05-05 ENCOUNTER — Other Ambulatory Visit: Payer: Self-pay

## 2021-05-05 ENCOUNTER — Ambulatory Visit (INDEPENDENT_AMBULATORY_CARE_PROVIDER_SITE_OTHER): Payer: Medicare Other

## 2021-05-05 DIAGNOSIS — Z0000 Encounter for general adult medical examination without abnormal findings: Secondary | ICD-10-CM | POA: Diagnosis not present

## 2021-05-05 NOTE — Patient Instructions (Signed)

## 2021-05-05 NOTE — Progress Notes (Signed)
Subjective:   Cristian Henderson is a 76 y.o. male who presents for Medicare Annual/Subsequent preventive examination. I connected with  [name] on 05/05/21 by a audio enabled telemedicine application and verified that I am speaking with the correct person using two identifiers.  Patient Location: Home  Provider Location: Office/Clinic  I discussed the limitations of evaluation and management by telemedicine. The patient expressed understanding and agreed to proceed.  Review of Systems    Defer To PCP Cardiac Risk Factors include: advanced age (>50men, >96 women);male gender;hypertension     Objective:    There were no vitals filed for this visit. There is no height or weight on file to calculate BMI.  Advanced Directives 05/05/2021 08/07/2020 08/06/2020 08/05/2020 07/28/2020 06/29/2020 05/02/2020  Does Patient Have a Medical Advance Directive? No No No No No No No  Would patient like information on creating a medical advance directive? No - Patient declined No - Patient declined - No - Patient declined - No - Patient declined No - Patient declined    Current Medications (verified) Outpatient Encounter Medications as of 05/05/2021  Medication Sig   acetaminophen (TYLENOL) 500 MG tablet Take 1,000 mg by mouth every 6 (six) hours as needed for mild pain or moderate pain (Back pain).   allopurinol (ZYLOPRIM) 100 MG tablet Take 0.5 tablets (50 mg total) by mouth daily.   cholecalciferol (VITAMIN D3) 25 MCG (1000 UNIT) tablet Take 1,000 Units by mouth in the morning and at bedtime.   cycloSPORINE modified (NEORAL) 25 MG capsule Take 75 mg by mouth 2 (two) times daily.   diltiazem (DILACOR XR) 240 MG 24 hr capsule Take 240 mg by mouth daily.   escitalopram (LEXAPRO) 10 MG tablet Take 1 tablet (10 mg total) by mouth daily.   ezetimibe (ZETIA) 10 MG tablet Take 1 tablet (10 mg total) by mouth daily.   ferrous sulfate 325 (65 FE) MG EC tablet Take 1 tablet by mouth every morning.    mycophenolate (MYFORTIC) 180 MG EC tablet Take 180 mg by mouth 2 (two) times daily.   Omega-3 Fatty Acids (FISH OIL) 1200 MG CAPS Take 2,400 mg by mouth in the morning and at bedtime.   omeprazole (PRILOSEC) 40 MG capsule Take 40 mg by mouth daily.   predniSONE (DELTASONE) 20 MG tablet Take 1 tablet (20 mg total) by mouth daily with breakfast.   Rivaroxaban (XARELTO) 15 MG TABS tablet Take 15 mg by mouth daily with breakfast.   sodium bicarbonate 650 MG tablet Take 650 mg by mouth 2 (two) times daily.   tamsulosin (FLOMAX) 0.4 MG CAPS capsule Take 1 capsule (0.4 mg total) by mouth daily.   No facility-administered encounter medications on file as of 05/05/2021.    Allergies (verified) Doxycycline, Doxycycline hyclate, and Simvastatin   History: Past Medical History:  Diagnosis Date   Acid reflux    Anxiety    Arthritis    Atrial fibrillation (HCC)    Depression    Dysrhythmia    AFIB   Gout    High cholesterol    Hypertension    Kidney failure    PTSD (post-traumatic stress disorder)    Past Surgical History:  Procedure Laterality Date   ATRIAL FIBRILLATION ABLATION N/A 10/03/2020   Procedure: ATRIAL FIBRILLATION ABLATION;  Surgeon: Constance Haw, MD;  Location: [facility];  Service: Cardiovascular;  Laterality: N/A;   CYSTOSCOPY  08/07/2020   Procedure: CYSTOSCOPY, INSERTION OF FOLEY CATHETER;  Surgeon: Cleon Gustin, MD;  Location: [facility];  Service: Urology;;   KIDNEY TRANSPLANT Right 2013   REPLACEMENT TOTAL KNEE Right    SKIN CANCER EXCISION     TOTAL SHOULDER REPLACEMENT Left    Family History  Problem Relation Age of Onset   Alzheimer's disease Mother    Diabetes Mother    Social History   Socioeconomic History   Marital status: Married    Spouse name: Lovey Newcomer   Number of children: 3   Years of education: 12   Highest education level: Associate degree: academic program  Occupational History   Not on file  Tobacco Use   Smoking status: Never     Passive exposure: Past   Smokeless tobacco: Never  Vaping Use   Vaping Use: Never used  Substance and Sexual Activity   Alcohol use: Not Currently   Drug use: Never   Sexual activity: Yes  Other Topics Concern   Not on file  Social History Narrative   Not on file   Social Determinants of Health   Financial Resource Strain: Low Risk    Difficulty of Paying Living Expenses: Not hard at all  Food Insecurity: No Food Insecurity   Worried About Charity fundraiser in the Last Year: Never true   [place] in the Last Year: Never true  Transportation Needs: No Transportation Needs   Lack of Transportation (Medical): No   Lack of Transportation (Non-Medical): No  Physical Activity: Unknown   Days of Exercise per Week: 3 days   Minutes of Exercise per Session: Not on file  Stress: Not on file  Social Connections: Moderately Integrated   Frequency of Communication with Friends and Family: More than three times a week   Frequency of Social Gatherings with Friends and Family: Twice a week   Attends Religious Services: More than 4 times per year   Active Member of [employer] or Organizations: No   Attends Music therapist: Never   Marital Status: Married    Tobacco Counseling Counseling given: Not Answered   Clinical Intake:  Pre-visit preparation completed: No  Pain : No/denies pain     Nutritional Risks: None Diabetes: No  How often do you need to have someone help you when you read instructions, pamphlets, or other written materials from your doctor or pharmacy?: 1 - Never What is the last grade level you completed in school?: 12  Diabetic?no     Information entered by :: [place] of Daily Living In your present state of health, do you have any difficulty performing the following activities: 05/05/2021 08/07/2020  Hearing? N N  Vision? N N  Difficulty concentrating or making decisions? N N  Walking or climbing stairs? N Y  Comment - -   Dressing or bathing? N N  Doing errands, shopping? N N  Preparing Food and eating ? N -  Using the Toilet? N -  In the past six months, have you accidently leaked urine? N -  Do you have problems with loss of bowel control? N -  Managing your Medications? Y -  Managing your Finances? Y -  Housekeeping or managing your Housekeeping? Y -  Some recent data might be hidden    Patient Care Team: [name], MD as PCP - General (Internal Medicine) Josue Hector, MD as PCP - Cardiology (Cardiology) Constance Haw, MD as PCP - Electrophysiology (Cardiology)  Indicate any recent Medical Services you may have received from other than [facility]  providers in the past year (date may be approximate).     Assessment:   This is a routine wellness examination for Cristian Henderson.  Hearing/Vision screen No results found.  Dietary issues and exercise activities discussed: Current Exercise Habits: Home exercise routine, Type of exercise: walking, Time (Minutes): 30, Frequency (Times/Week): 3, Weekly Exercise (Minutes/Week): 90, Intensity: Moderate, Exercise limited by: cardiac condition(s)   Goals Addressed   None   Depression Screen PHQ 2/9 Scores 05/05/2021 12/24/2020 08/29/2020 05/08/2020 05/02/2020 04/25/2020  PHQ - 2 Score 0 0 0 0 0 0    Fall Risk Fall Risk  05/05/2021 12/24/2020 08/29/2020 05/08/2020 05/02/2020  Falls in the past year? 1 1 0 0 0  Number falls in past yr: 0 0 0 0 0  Injury with Fall? 0 0 0 0 0  Risk for fall due to : No Fall Risks No Fall Risks No Fall Risks No Fall Risks No Fall Risks  Follow up Falls evaluation completed Falls prevention discussed Falls evaluation completed Falls evaluation completed Falls evaluation completed    [place]:  Any stairs in or around the home? Yes  If so, are there any without handrails? Yes  Home free of loose throw rugs in walkways, pet beds, electrical cords, etc? Yes  Adequate lighting in your home to  reduce risk of falls? Yes   ASSISTIVE DEVICES UTILIZED TO PREVENT FALLS:  Life alert? No  Use of a cane, walker or w/c? No  Grab bars in the bathroom? No  Shower chair or bench in shower? No  Elevated toilet seat or a handicapped toilet? Yes    Cognitive Function:     6CIT Screen 05/05/2021 05/02/2020  What Year? 0 points 0 points  What month? 0 points 0 points  What time? 0 points 0 points  Count back from 20 0 points 0 points  Months in reverse 0 points 0 points  Repeat phrase 0 points 0 points  Total Score 0 0    Immunizations Immunization History  Administered Date(s) Administered   Fluad Quad(high Dose 65+) 01/04/2020, 12/24/2020   Moderna Sars-Covid-2 Vaccination 06/02/2019, 06/30/2019, 12/20/2019   Pfizer Covid-19 Vaccine Bivalent Booster 28yrs & up 11/10/2020   Zoster Recombinat (Shingrix) 11/10/2020    TDAP status: Due, Education has been provided regarding the importance of this vaccine. Advised may receive this vaccine at local pharmacy or Health Dept. Aware to provide a copy of the vaccination record if obtained from local pharmacy or Health Dept. Verbalized acceptance and understanding.  Flu Vaccine status: Up to date  Pneumococcal vaccine status: Due, Education has been provided regarding the importance of this vaccine. Advised may receive this vaccine at local pharmacy or Health Dept. Aware to provide a copy of the vaccination record if obtained from local pharmacy or Health Dept. Verbalized acceptance and understanding.  Covid-19 vaccine status: Information provided on how to obtain vaccines.   Qualifies for Shingles Vaccine? No   Zostavax completed No   Shingrix Completed?: No.    Education has been provided regarding the importance of this vaccine. Patient has been advised to call insurance company to determine out of pocket expense if they have not yet received this vaccine. Advised may also receive vaccine at local pharmacy or Health Dept. Verbalized  acceptance and understanding.  Screening Tests Health Maintenance  Topic Date Due   Pneumonia Vaccine 35+ Years old (1 - PCV) Never done   Hepatitis C Screening  Never done   TETANUS/TDAP  Never done   COLONOSCOPY (Pts 45-8yrs Insurance coverage will need to be confirmed)  Never done   Zoster Vaccines- Shingrix (2 of 2) 01/05/2021   INFLUENZA VACCINE  Completed   COVID-19 Vaccine  Completed   HPV VACCINES  Aged Out    Health Maintenance  Health Maintenance Due  Topic Date Due   Pneumonia Vaccine 42+ Years old (1 - PCV) Never done   Hepatitis C Screening  Never done   TETANUS/TDAP  Never done   COLONOSCOPY (Pts 45-9yrs Insurance coverage will need to be confirmed)  Never done   Zoster Vaccines- Shingrix (2 of 2) 01/05/2021    Colorectal cancer screening: Type of screening: Colonoscopy. Completed 2020. Repeat every 10 years  Lung Cancer Screening: (Low Dose CT Chest recommended if Age 79-80 years, 30 pack-year currently smoking OR have quit w/in 15years.) does not qualify.   Lung Cancer Screening Referral: n/a  Additional Screening:  Hepatitis C Screening: does not qualify; Completed not at high risk  Vision Screening: Recommended annual ophthalmology exams for early detection of glaucoma and other disorders of the eye. Is the patient up to date with their annual eye exam?  Yes  Who is the provider or what is the name of the office in which the patient attends annual eye exams? Dr in [place] If pt is not established with a provider, would they like to be referred to a provider to establish care? No .   Dental Screening: Recommended annual dental exams for proper oral hygiene  Community Resource Referral / Chronic Care Management: CRR required this visit?  Yes   CCM required this visit?  No      Plan:     I have personally reviewed and noted the following in the patients chart:   Medical and social history Use of alcohol, tobacco or illicit drugs  Current  medications and supplements including opioid prescriptions. Patient is not currently taking opioid prescriptions. Functional ability and status Nutritional status Physical activity Advanced directives List of other physicians Hospitalizations, surgeries, and ER visits in previous 12 months Vitals Screenings to include cognitive, depression, and falls Referrals and appointments  In addition, I have reviewed and discussed with patient certain preventive protocols, quality metrics, and best practice recommendations. A written personalized care plan for preventive services as well as general preventive health recommendations were provided to patient.     Earline Mayotte, [place]   05/05/2021   Nurse Notes:  Mr. Creighton , Thank you for taking time to come for your Medicare Wellness Visit. I appreciate your ongoing commitment to your health goals. Please review the following plan we discussed and let me know if I can assist you in the future.   These are the goals we discussed:  Goals      Weight (lb) < 200 lb (90.7 kg)     Wants to lose 15lbs.        This is a list of the screening recommended for you and due dates:  Health Maintenance  Topic Date Due   Pneumonia Vaccine (1 - PCV) Never done   Hepatitis C Screening: USPSTF Recommendation to screen - Ages 84-79 yo.  Never done   Tetanus Vaccine  Never done   Colon Cancer Screening  Never done   Zoster (Shingles) Vaccine (2 of 2) 01/05/2021   Flu Shot  Completed   COVID-19 Vaccine  Completed   HPV Vaccine  Aged Out

## 2021-05-06 ENCOUNTER — Other Ambulatory Visit: Payer: Self-pay

## 2021-05-06 ENCOUNTER — Emergency Department (HOSPITAL_COMMUNITY): Payer: Medicare Other

## 2021-05-06 ENCOUNTER — Observation Stay (HOSPITAL_COMMUNITY)
Admission: EM | Admit: 2021-05-06 | Discharge: 2021-05-09 | Disposition: A | Discharge status: Home / Self Care | Payer: Medicare Other | Attending: Emergency Medicine | Admitting: Emergency Medicine

## 2021-05-06 ENCOUNTER — Encounter (HOSPITAL_COMMUNITY): Payer: Self-pay | Admitting: Emergency Medicine

## 2021-05-06 DIAGNOSIS — N183 Chronic kidney disease, stage 3 unspecified: Secondary | ICD-10-CM | POA: Diagnosis present

## 2021-05-06 DIAGNOSIS — R4182 Altered mental status, unspecified: Secondary | ICD-10-CM | POA: Insufficient documentation

## 2021-05-06 DIAGNOSIS — I480 Paroxysmal atrial fibrillation: Secondary | ICD-10-CM | POA: Diagnosis not present

## 2021-05-06 DIAGNOSIS — Z79899 Other long term (current) drug therapy: Secondary | ICD-10-CM | POA: Diagnosis not present

## 2021-05-06 DIAGNOSIS — I129 Hypertensive chronic kidney disease with stage 1 through stage 4 chronic kidney disease, or unspecified chronic kidney disease: Secondary | ICD-10-CM | POA: Insufficient documentation

## 2021-05-06 DIAGNOSIS — Z7901 Long term (current) use of anticoagulants: Secondary | ICD-10-CM | POA: Diagnosis not present

## 2021-05-06 DIAGNOSIS — I6782 Cerebral ischemia: Secondary | ICD-10-CM | POA: Insufficient documentation

## 2021-05-06 DIAGNOSIS — Z20822 Contact with and (suspected) exposure to covid-19: Secondary | ICD-10-CM | POA: Insufficient documentation

## 2021-05-06 DIAGNOSIS — N1831 Chronic kidney disease, stage 3a: Secondary | ICD-10-CM | POA: Insufficient documentation

## 2021-05-06 DIAGNOSIS — Z940 Kidney transplant status: Secondary | ICD-10-CM | POA: Diagnosis not present

## 2021-05-06 DIAGNOSIS — R519 Headache, unspecified: Secondary | ICD-10-CM | POA: Diagnosis not present

## 2021-05-06 DIAGNOSIS — R4781 Slurred speech: Secondary | ICD-10-CM

## 2021-05-06 DIAGNOSIS — E785 Hyperlipidemia, unspecified: Secondary | ICD-10-CM | POA: Diagnosis not present

## 2021-05-06 DIAGNOSIS — R410 Disorientation, unspecified: Secondary | ICD-10-CM | POA: Diagnosis not present

## 2021-05-06 DIAGNOSIS — H538 Other visual disturbances: Secondary | ICD-10-CM | POA: Insufficient documentation

## 2021-05-06 DIAGNOSIS — I10 Essential (primary) hypertension: Secondary | ICD-10-CM | POA: Diagnosis present

## 2021-05-06 LAB — URINALYSIS, ROUTINE W REFLEX MICROSCOPIC
Bilirubin Urine: NEGATIVE
Glucose, UA: NEGATIVE mg/dL
Ketones, ur: NEGATIVE mg/dL
Leukocytes,Ua: NEGATIVE
Nitrite: NEGATIVE
Protein, ur: NEGATIVE mg/dL
Specific Gravity, Urine: 1.01 (ref 1.005–1.030)
pH: 7 (ref 5.0–8.0)

## 2021-05-06 LAB — CBC
HCT: 40.2 % (ref 39.0–52.0)
Hemoglobin: 13.7 g/dL (ref 13.0–17.0)
MCH: 31.6 pg (ref 26.0–34.0)
MCHC: 34.1 g/dL (ref 30.0–36.0)
MCV: 92.6 fL (ref 80.0–100.0)
Platelets: 194 10*3/uL (ref 150–400)
RBC: 4.34 MIL/uL (ref 4.22–5.81)
RDW: 14.3 % (ref 11.5–15.5)
WBC: 7.5 10*3/uL (ref 4.0–10.5)
nRBC: 0 % (ref 0.0–0.2)

## 2021-05-06 LAB — COMPREHENSIVE METABOLIC PANEL
ALT: 15 U/L (ref 0–44)
AST: 15 U/L (ref 15–41)
Albumin: 3.5 g/dL (ref 3.5–5.0)
Alkaline Phosphatase: 77 U/L (ref 38–126)
Anion gap: 10 (ref 5–15)
BUN: 29 mg/dL — ABNORMAL HIGH (ref 8–23)
CO2: 22 mmol/L (ref 22–32)
Calcium: 9.4 mg/dL (ref 8.9–10.3)
Chloride: 103 mmol/L (ref 98–111)
Creatinine, Ser: 1.38 mg/dL — ABNORMAL HIGH (ref 0.61–1.24)
GFR, Estimated: 53 mL/min — ABNORMAL LOW (ref >60)
Glucose, Bld: 138 mg/dL — ABNORMAL HIGH (ref 70–99)
Potassium: 4.2 mmol/L (ref 3.5–5.1)
Sodium: 135 mmol/L (ref 135–145)
Total Bilirubin: 0.7 mg/dL (ref 0.3–1.2)
Total Protein: 6.6 g/dL (ref 6.5–8.1)

## 2021-05-06 LAB — I-STAT CHEM 8, ED
BUN: 34 mg/dL — ABNORMAL HIGH (ref 8–23)
Calcium, Ion: 1.24 mmol/L (ref 1.15–1.40)
Chloride: 104 mmol/L (ref 98–111)
Creatinine, Ser: 1.3 mg/dL — ABNORMAL HIGH (ref 0.61–1.24)
Glucose, Bld: 138 mg/dL — ABNORMAL HIGH (ref 70–99)
HCT: 41 % (ref 39.0–52.0)
Hemoglobin: 13.9 g/dL (ref 13.0–17.0)
Potassium: 4.3 mmol/L (ref 3.5–5.1)
Sodium: 138 mmol/L (ref 135–145)
TCO2: 26 mmol/L (ref 22–32)

## 2021-05-06 LAB — APTT: aPTT: 37 seconds — ABNORMAL HIGH (ref 24–36)

## 2021-05-06 LAB — DIFFERENTIAL
Abs Immature Granulocytes: 0.03 10*3/uL (ref 0.00–0.07)
Basophils Absolute: 0 10*3/uL (ref 0.0–0.1)
Basophils Relative: 0 %
Eosinophils Absolute: 0.2 10*3/uL (ref 0.0–0.5)
Eosinophils Relative: 3 %
Immature Granulocytes: 0 %
Lymphocytes Relative: 25 %
Lymphs Abs: 1.9 10*3/uL (ref 0.7–4.0)
Monocytes Absolute: 0.9 10*3/uL (ref 0.1–1.0)
Monocytes Relative: 12 %
Neutro Abs: 4.4 10*3/uL (ref 1.7–7.7)
Neutrophils Relative %: 60 %

## 2021-05-06 LAB — ETHANOL: Alcohol, Ethyl (B): <10 mg/dL (ref <10)

## 2021-05-06 LAB — URINALYSIS, MICROSCOPIC (REFLEX)
Bacteria, UA: NONE SEEN
Squamous Epithelial / HPF: NONE SEEN (ref 0–5)

## 2021-05-06 LAB — RESP PANEL BY RT-PCR (FLU A&B, COVID) ARPGX2
Influenza A by PCR: NEGATIVE
Influenza B by PCR: NEGATIVE
SARS Coronavirus 2 by RT PCR: NEGATIVE

## 2021-05-06 LAB — RAPID URINE DRUG SCREEN, HOSP PERFORMED
Amphetamines: NOT DETECTED
Barbiturates: NOT DETECTED
Benzodiazepines: NOT DETECTED
Cocaine: NOT DETECTED
Opiates: NOT DETECTED
Tetrahydrocannabinol: NOT DETECTED

## 2021-05-06 LAB — PROTIME-INR
INR: 2.4 — ABNORMAL HIGH (ref 0.8–1.2)
Prothrombin Time: 25.9 seconds — ABNORMAL HIGH (ref 11.4–15.2)

## 2021-05-06 LAB — CBG MONITORING, ED: Glucose-Capillary: 120 mg/dL — ABNORMAL HIGH (ref 70–99)

## 2021-05-06 MED ORDER — LACTATED RINGERS IV BOLUS
1000.0000 mL | Freq: Once | INTRAVENOUS | Status: AC
  Administered 2021-05-06: 1000 mL via INTRAVENOUS

## 2021-05-06 MED ORDER — IOHEXOL 350 MG/ML SOLN
75.0000 mL | Freq: Once | INTRAVENOUS | Status: AC | PRN
  Administered 2021-05-06: 75 mL via INTRAVENOUS

## 2021-05-06 MED ORDER — METOCLOPRAMIDE HCL 5 MG/ML IJ SOLN
10.0000 mg | Freq: Once | INTRAMUSCULAR | Status: AC
  Administered 2021-05-06: 10 mg via INTRAVENOUS
  Filled 2021-05-06: qty 2

## 2021-05-06 MED ORDER — ACETAMINOPHEN 500 MG PO TABS
1000.0000 mg | ORAL_TABLET | Freq: Once | ORAL | Status: AC
  Administered 2021-05-06: 1000 mg via ORAL
  Filled 2021-05-06: qty 2

## 2021-05-06 MED ORDER — DIPHENHYDRAMINE HCL 50 MG/ML IJ SOLN
25.0000 mg | Freq: Once | INTRAMUSCULAR | Status: AC
  Administered 2021-05-06: 25 mg via INTRAVENOUS
  Filled 2021-05-06: qty 1

## 2021-05-06 NOTE — ED Provider Notes (Addendum)
[facility] EMERGENCY DEPARTMENT Provider Note   CSN: [id number] Arrival date & time: 05/06/21  1727     History  Chief Complaint  Patient presents with   Headache   Blurred Vision   Altered Mental Status    Cristian Henderson is a 76 y.o. male.   Headache Altered Mental Status Presenting symptoms: confusion   Associated symptoms: headaches    76 year old male with medical history significant for hypertension, atrial fibrillation on Xarelto, PTSD, GERD, anxiety, depression, HLD who presents to the emergency department with a headache.  The patient states that he had a sudden onset thunderclap headache this morning around 6 AM.  He endorsed maximal onset intensity headache that he described as the worst headache he has ever experienced that came on suddenly this morning.  He had no neurologic deficits associated with his headache.  He has no significant history of migraine headaches.  He does have a history of atrial fibrillation and has been taking his Xarelto.  Per his wife, he did have an episode of slurred speech this afternoon that lasted around 2 hours.  No other neurologic deficits were noted per family members.  He did have some blurry vision this morning and had no relief with over-the-counter medications.  His wife also noted some confusion around that time of slurred speech this afternoon.  Those symptoms have all since resolved.  He denies any confusion at this time to the [facility] emergency department complaining of a 10 out of 10 headache,, arrived GCS 15, ABC intact.  Home Medications Prior to Admission medications   Medication Sig Start Date End Date Taking? Authorizing Provider  acetaminophen (TYLENOL) 500 MG tablet Take 1,000 mg by mouth every 6 (six) hours as needed for mild pain or moderate pain (Back pain).   Yes [provider]  allopurinol (ZYLOPRIM) 100 MG tablet Take 0.5 tablets (50 mg total) by mouth daily. 08/20/20  Yes McKenzie, Candee Furbish, MD  cholecalciferol (VITAMIN D3) 25 MCG (1000 UNIT) tablet Take 1,000 Units by mouth in the morning and at bedtime.   Yes [provider]  cycloSPORINE modified (NEORAL) 25 MG capsule Take 75 mg by mouth 2 (two) times daily.   Yes [provider]  diltiazem (DILACOR XR) 240 MG 24 hr capsule Take 240 mg by mouth daily.   Yes [provider]  escitalopram (LEXAPRO) 10 MG tablet Take 1 tablet (10 mg total) by mouth daily. 04/25/20  Yes [name], MD  ezetimibe (ZETIA) 10 MG tablet Take 1 tablet (10 mg total) by mouth daily. 04/25/20  Yes [name], MD  ferrous sulfate 325 (65 FE) MG EC tablet Take 1 tablet by mouth 2 (two) times daily. 01/26/21  Yes [provider]  mycophenolate (MYFORTIC) 180 MG EC tablet Take 180 mg by mouth 2 (two) times daily.   Yes [provider]  Omega-3 Fatty Acids (FISH OIL) 1200 MG CAPS Take 2,400 mg by mouth in the morning and at bedtime.   Yes [provider]  omeprazole (PRILOSEC) 40 MG capsule Take 40 mg by mouth daily.   Yes [provider]  predniSONE (DELTASONE) 20 MG tablet Take 1 tablet (20 mg total) by mouth daily with breakfast. 12/24/20  Yes [name], MD  Rivaroxaban (XARELTO) 15 MG TABS tablet Take 15 mg by mouth daily with breakfast.   Yes [provider]  sodium bicarbonate 650 MG tablet Take 650 mg by mouth 2 (two) times daily.  Yes [provider]  tamsulosin (FLOMAX) 0.4 MG CAPS capsule Take 1 capsule (0.4 mg total) by mouth daily. 11/28/20  Yes McKenzie, Candee Furbish, MD      Allergies    Doxycycline hyclate and Simvastatin    Review of Systems   Review of Systems  Neurological:  Positive for speech difficulty and headaches.  Psychiatric/Behavioral:  Positive for confusion.   All other systems reviewed and are negative.  Physical Exam Updated Vital Signs BP (!) 149/75    Pulse (!) 57    Temp 98.1 F (36.7 C) (Oral)    Resp 14    SpO2 97%  Physical  Exam Vitals and nursing note reviewed.  Constitutional:      General: He is not in acute distress.    Appearance: He is well-developed.  HENT:     Head: Normocephalic and atraumatic.  Eyes:     Conjunctiva/sclera: Conjunctivae normal.     Pupils: Pupils are equal, round, and reactive to light.  Cardiovascular:     Rate and Rhythm: Normal rate and regular rhythm.     Heart sounds: No murmur heard. Pulmonary:     Effort: Pulmonary effort is normal. No respiratory distress.     Breath sounds: Normal breath sounds.  Abdominal:     General: There is no distension.     Palpations: Abdomen is soft.     Tenderness: There is no abdominal tenderness. There is no guarding.  Musculoskeletal:        General: No swelling, deformity or signs of injury.     Cervical back: Neck supple.  Skin:    General: Skin is warm and dry.     Capillary Refill: Capillary refill takes less than 2 seconds.     Findings: No lesion or rash.  Neurological:     General: No focal deficit present.     Mental Status: He is alert and oriented to person, place, and time. Mental status is at baseline.     GCS: GCS eye subscore is 4. GCS verbal subscore is 5. GCS motor subscore is 6.     Comments: MENTAL STATUS EXAM:    Orientation: Alert and oriented to person, place and time.  Memory: Cooperative, follows commands well.  Language: Speech is clear and language is normal.   CRANIAL NERVES:    CN 2 (Optic): Visual fields intact to confrontation.  CN 3,4,6 (EOM): Pupils equal and reactive to light. Full extraocular eye movement without nystagmus.  CN 5 (Trigeminal): Facial sensation is normal, no weakness of masticatory muscles.  CN 7 (Facial): No facial weakness or asymmetry.  CN 8 (Auditory): Auditory acuity grossly normal.  CN 9,10 (Glossophar): The uvula is midline, the palate elevates symmetrically.  CN 11 (spinal access): Normal sternocleidomastoid and trapezius strength.  CN 12 (Hypoglossal): The tongue is  midline. No atrophy or fasciculations.Marland Kitchen   MOTOR:  Muscle Strength: 5/5RUE, 5/5LUE, 5/5RLE, 5/5LLE.   COORDINATION:   Intact finger-to-nose, no tremor.   SENSATION:   Intact to light touch all four extremities.  GAIT: Gait not assessed   Psychiatric:        Mood and Affect: Mood normal.    ED Results / Procedures / Treatments   Labs (all labs ordered are listed, but only abnormal results are displayed) Labs Reviewed  PROTIME-INR - Abnormal; Notable for the following components:      Result Value   Prothrombin Time 25.9 (*)    INR 2.4 (*)    All other components  within normal limits  APTT - Abnormal; Notable for the following components:   aPTT 37 (*)    All other components within normal limits  COMPREHENSIVE METABOLIC PANEL - Abnormal; Notable for the following components:   Glucose, Bld 138 (*)    BUN 29 (*)    Creatinine, Ser 1.38 (*)    GFR, Estimated 53 (*)    All other components within normal limits  URINALYSIS, ROUTINE W REFLEX MICROSCOPIC - Abnormal; Notable for the following components:   Hgb urine dipstick TRACE (*)    All other components within normal limits  I-STAT CHEM 8, ED - Abnormal; Notable for the following components:   BUN 34 (*)    Creatinine, Ser 1.30 (*)    Glucose, Bld 138 (*)    All other components within normal limits  CBG MONITORING, ED - Abnormal; Notable for the following components:   Glucose-Capillary 120 (*)    All other components within normal limits  RESP PANEL BY RT-PCR (FLU A&B, COVID) ARPGX2  ETHANOL  CBC  DIFFERENTIAL  RAPID URINE DRUG SCREEN, HOSP PERFORMED  URINALYSIS, MICROSCOPIC (REFLEX)    EKG EKG Interpretation  Date/Time:  Wednesday May 06 2021 18:01:56 EST Ventricular Rate:  53 PR Interval:  150 QRS Duration: 80 QT Interval:  418 QTC Calculation: 392 R Axis:   49 Text Interpretation: Sinus bradycardia Otherwise normal ECG When compared with ECG of 31-Oct-2020 09:05, PREVIOUS ECG IS PRESENT Confirmed by  Regan Lemming ([id number]) on 05/06/2021 8:14:49 PM  Radiology CT ANGIO HEAD NECK W WO CM  Result Date: 05/06/2021 CLINICAL DATA:  Headache and blurred vision, confusion EXAM: CT ANGIOGRAPHY HEAD AND NECK TECHNIQUE: Multidetector CT imaging of the head and neck was performed using the standard protocol during bolus administration of intravenous contrast. Multiplanar CT image reconstructions and MIPs were obtained to evaluate the vascular anatomy. Carotid stenosis measurements (when applicable) are obtained utilizing NASCET criteria, using the distal internal carotid diameter as the denominator. RADIATION DOSE REDUCTION: This exam was performed according to the departmental dose-optimization program which includes automated exposure control, adjustment of the mA and/or kV according to patient size and/or use of iterative reconstruction technique. CONTRAST:  11mL OMNIPAQUE IOHEXOL 350 MG/ML SOLN COMPARISON:  No prior CTA, correlation is made with CT head 05/06/2021. FINDINGS: CT HEAD FINDINGS For noncontrast findings, please see same day CT head. CTA NECK FINDINGS Aortic arch: Standard branching. Imaged portion shows no evidence of aneurysm or dissection. No significant stenosis of the major arch vessel origins. Aortic atherosclerosis Right carotid system: No evidence of dissection, stenosis (50% or greater) or occlusion. Left carotid system: No evidence of dissection, stenosis (50% or greater) or occlusion. Vertebral arteries: The left vertebral artery is not visualized in the V1 and proximal V2 segments, with faint opacification in the mid and distal V2 segments, with more robust opacification in the V3 and V4 segments. The right vertebral artery is unremarkable. Skeleton: No acute osseous abnormality. Other neck: Negative. Upper chest: Calcified lesion in the right upper lobe, likely sequela of prior granulomatous disease. No pleural effusion or focal pulmonary opacity. Review of the MIP images confirms the above  findings CTA HEAD FINDINGS Anterior circulation: Both internal carotid arteries are patent to the termini, with mild calcifications but without significant stenosis. A1 segments patent. Normal anterior communicating artery. Anterior cerebral arteries are patent to their distal aspects. No M1 stenosis or occlusion. Normal MCA bifurcations. Mild narrowing at the origin of a right M2 branch (series 7, image 113). Distal  MCA branches otherwise perfused and symmetric. Posterior circulation: Moderate to severe focal stenosis in the proximal left V4 (series 7, image 159). Vertebral arteries otherwise patent to the vertebrobasilar junction without stenosis. Posterior inferior cerebral arteries patent bilaterally. Basilar patent to its distal aspect. Superior cerebellar arteries patent bilaterally. Patent P1 segments. PCAs perfused to their distal aspects without stenosis. The bilateral posterior communicating arteries are not visualized. Venous sinuses: As permitted by contrast timing, patent. Anatomic variants: None significant Review of the MIP images confirms the above findings IMPRESSION: 1. Non opacification of the proximal left vertebral artery, in its V1 and proximal V2 segments, with faint opacification in the mid and distal V2 segments and more robust opacification in the V3 and V4 segments. 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 The right vertebral artery is unremarkable. 2.  No intracranial large vessel occlusion or significant stenosis. 3.  No other hemodynamically significant stenosis in the neck. Electronically Signed   By: Merilyn Baba M.D.   On: 05/06/2021 23:26   CT HEAD WO CONTRAST  Result Date: 05/06/2021 CLINICAL DATA:  Altered mental status.  Headache.  Blurred vision. EXAM: CT HEAD WITHOUT CONTRAST TECHNIQUE: Contiguous axial images were obtained from the base of the skull through the vertex without intravenous contrast. RADIATION DOSE REDUCTION: This exam  was performed according to the departmental dose-optimization program which includes automated exposure control, adjustment of the mA and/or kV according to patient size and/or use of iterative reconstruction technique. COMPARISON:  None. FINDINGS: Brain: Minimally enlarged ventricles and subarachnoid spaces. Minimal patchy white matter low density in both cerebral hemispheres. No intracranial hemorrhage, mass lesion or CT evidence of acute infarction. Vascular: No hyperdense vessel or unexpected calcification. Skull: Normal. Negative for fracture or focal lesion. Sinuses/Orbits: Status post bilateral cataract extraction. Unremarkable bones and included paranasal sinuses. Other: None. IMPRESSION: 1. No acute abnormality. 2. Minimal diffuse cerebral and cerebellar atrophy. 3. Minimal chronic small vessel white matter ischemic changes in both cerebral hemispheres. Electronically Signed   By: Claudie Revering M.D.   On: 05/06/2021 18:43    Procedures Procedures    Medications Ordered in ED Medications  metoCLOPramide (REGLAN) injection 10 mg (10 mg Intravenous Given 05/06/21 2027)  diphenhydrAMINE (BENADRYL) injection 25 mg (25 mg Intravenous Given 05/06/21 2027)  lactated ringers bolus 1,000 mL (0 mLs Intravenous Stopped 05/06/21 2200)  acetaminophen (TYLENOL) tablet 1,000 mg (1,000 mg Oral Given 05/06/21 2020)  iohexol (OMNIPAQUE) 350 MG/ML injection 75 mL (75 mLs Intravenous Contrast Given 05/06/21 2230)    ED Course/ Medical Decision Making/ A&P                           Medical Decision Making Amount and/or Complexity of Data Reviewed Labs: ordered. Radiology: ordered.  Risk OTC drugs. Prescription drug management.    76 year old male with medical history significant for hypertension, atrial fibrillation on Xarelto, PTSD, GERD, anxiety, depression, HLD who presents to the emergency department with a headache.  The patient states that he had a sudden onset thunderclap headache this morning around 6  AM.  He endorsed maximal onset intensity headache that he described as the worst headache he has ever experienced that came on suddenly this morning.  He had no neurologic deficits associated with his headache.  He has no significant history of migraine headaches.  He does have a history of atrial fibrillation and has been taking his Xarelto.  Per his wife, he  did have an episode of slurred speech this afternoon that lasted around 2 hours.  No other neurologic deficits were noted per family members.  He did have some blurry vision this morning and had no relief with over-the-counter medications.  His wife also noted some confusion around that time of slurred speech this afternoon.  Those symptoms have all since resolved.  He denies any confusion at this time to the [facility] emergency department complaining of a 10 out of 10 headache, arrived GCS 15, ABC intact.  On arrival, sinus bradycardia noted on cardiac telemetry.  This was confirmed by EKG which revealed sinus bradycardia, ventricular rate 5 3, no ischemic changes noted.  The patient denies any syncopal episode.  He endorses persistent headache that came on suddenly this morning.  Differential diagnosis includes subarachnoid hemorrhage, hemorrhagic stroke, complex migraine, near syncope from symptomatic bradycardia.  Considered TIA in the setting of the patient's confusion and slurred speech earlier today.  Additionally consider vascular abnormality.  CT of the head was performed roughly 12 hours status post initial onset of symptoms which was negative for acute abnormalities.  I explained to the patient that CT is less sensitive and specific after 6 hours from onset of symptoms in the setting of subarachnoid hemorrhage.  I also discussed with the patient that lumbar puncture is generally contraindicated in the setting of his recent Xarelto use.  Therefore, we will proceed with CTA imaging of the head and neck which was subsequently  performed.  Laboratory work was performed reveals urinalysis with trace hemoglobin, negative for UTI, BMP with a BUN of 34 creatinine 1.3, hyperglycemia to 138, no other electrolyte abnormalities noted, COVID-19 influenza PCR negative, UDS negative, CBG 120, ethanol level less than 10, CMP with stable creatinine to 1.38 from 1.47 7x months ago.  CTA imaging revealed nonopacification of the proximal left vertebral artery, and its V1 and proximal V2 segments with faint opacification of the mid and distal V2 segments and robust opacification in the V3 and V4 segments.  There is also severe focal stenosis in the proximal left V4.  The right vertebral artery was notably unremarkable.  There is no evidence of large vessel occlusion or stenosis intracranially noted.  No other significant stenosis in the neck noted.  Was consulted and recommended further evaluation with MRI of the brain which was pending at time of signout.  Signout given to Dr. Leonette Monarch at 2330.   Final Clinical Impression(s) / ED Diagnoses Final diagnoses:  Acute nonintractable headache, unspecified headache type  Slurred speech  Confusion    Rx / DC Orders ED Discharge Orders     None         Regan Lemming, MD 05/07/21 0100    Regan Lemming, MD 05/07/21 [id number]

## 2021-05-06 NOTE — ED Provider Triage Note (Signed)
Emergency Medicine Provider Triage Evaluation Note  Cristian Henderson , a 76 y.o. male  was evaluated in triage.  Pt complains of confusion.  Patient is unable to give more details.  He has a history of atrial fibrillation and hypertension per chart.  One of his recent EP notes states that he had an episode of confusion several weeks ago which resolved.  Per patient's wife, patient awoke this morning around 6 AM with a headache.  He also had some blurry vision.  He did not get relief with over-the-counter medications.  Around 2 PM he had confusion.  She also noted slurred speech that resolved.  Patient continues to be confused.  Previous episode of confusion was around January 1 of January 2.  Review of Systems  Positive: Headache, confusion Negative: Fever  Physical Exam  BP (!) 154/66    Pulse (!) 55    Temp 98.1 F (36.7 C) (Oral)    Resp 16    SpO2 99%  Gen:   Awake, no distress   Resp:  Normal effort  MSK:   Moves extremities without difficulty  Other:  Gross motor, sensation, cranial nerves intact, no facial droop or slurred speech, patient unable to give details about how he feels today but no obvious word finding difficulties or expressive aphasia.  Medical Decision Making  Medically screening exam initiated at 5:36 PM.  Appropriate orders placed.  Cristian Henderson was informed that the remainder of the evaluation will be completed by another provider, this initial triage assessment does not replace that evaluation, and the importance of remaining in the ED until their evaluation is complete.     Cristian Cater, PA-C 05/06/21 1759

## 2021-05-06 NOTE — ED Triage Notes (Signed)
Patient coming from home, complaint of headache and blurred vision that began upon waking @ 0600 this morning, complaint of confusion that began approx 1400 this afternoon. VSS. NAD.

## 2021-05-07 ENCOUNTER — Encounter (HOSPITAL_COMMUNITY): Payer: Self-pay | Admitting: Internal Medicine

## 2021-05-07 ENCOUNTER — Emergency Department (HOSPITAL_COMMUNITY): Payer: Medicare Other

## 2021-05-07 ENCOUNTER — Observation Stay (HOSPITAL_COMMUNITY): Payer: Medicare Other

## 2021-05-07 DIAGNOSIS — Z940 Kidney transplant status: Secondary | ICD-10-CM | POA: Diagnosis not present

## 2021-05-07 DIAGNOSIS — I10 Essential (primary) hypertension: Secondary | ICD-10-CM

## 2021-05-07 DIAGNOSIS — R410 Disorientation, unspecified: Secondary | ICD-10-CM | POA: Diagnosis not present

## 2021-05-07 DIAGNOSIS — G4459 Other complicated headache syndrome: Secondary | ICD-10-CM

## 2021-05-07 DIAGNOSIS — R519 Headache, unspecified: Secondary | ICD-10-CM | POA: Diagnosis present

## 2021-05-07 DIAGNOSIS — N1831 Chronic kidney disease, stage 3a: Secondary | ICD-10-CM

## 2021-05-07 DIAGNOSIS — R4781 Slurred speech: Secondary | ICD-10-CM | POA: Diagnosis not present

## 2021-05-07 DIAGNOSIS — I480 Paroxysmal atrial fibrillation: Secondary | ICD-10-CM

## 2021-05-07 MED ORDER — PREDNISONE 20 MG PO TABS
20.0000 mg | ORAL_TABLET | Freq: Every day | ORAL | Status: DC
  Administered 2021-05-08 – 2021-05-09 (×2): 20 mg via ORAL
  Filled 2021-05-07 (×2): qty 1

## 2021-05-07 MED ORDER — GADOBUTROL 1 MMOL/ML IV SOLN
7.5000 mL | Freq: Once | INTRAVENOUS | Status: AC | PRN
  Administered 2021-05-07: 7.5 mL via INTRAVENOUS

## 2021-05-07 MED ORDER — DILTIAZEM HCL ER COATED BEADS 240 MG PO CP24
240.0000 mg | ORAL_CAPSULE | Freq: Every day | ORAL | Status: DC
  Administered 2021-05-07 – 2021-05-09 (×3): 240 mg via ORAL
  Filled 2021-05-07 (×2): qty 1
  Filled 2021-05-07: qty 2

## 2021-05-07 MED ORDER — SODIUM CHLORIDE 0.9 % IV SOLN: INTRAVENOUS | Status: DC

## 2021-05-07 MED ORDER — MYCOPHENOLATE SODIUM 180 MG PO TBEC
180.0000 mg | DELAYED_RELEASE_TABLET | Freq: Two times a day (BID) | ORAL | Status: DC
  Administered 2021-05-07 – 2021-05-09 (×5): 180 mg via ORAL
  Filled 2021-05-07 (×6): qty 1

## 2021-05-07 MED ORDER — SENNOSIDES-DOCUSATE SODIUM 8.6-50 MG PO TABS: 1.0000 | ORAL_TABLET | Freq: Every evening | ORAL | Status: DC | PRN

## 2021-05-07 MED ORDER — ENOXAPARIN SODIUM 40 MG/0.4ML IJ SOSY
40.0000 mg | PREFILLED_SYRINGE | INTRAMUSCULAR | Status: DC
  Administered 2021-05-07 – 2021-05-08 (×2): 40 mg via SUBCUTANEOUS
  Filled 2021-05-07 (×2): qty 0.4

## 2021-05-07 MED ORDER — ESCITALOPRAM OXALATE 10 MG PO TABS
10.0000 mg | ORAL_TABLET | Freq: Every day | ORAL | Status: DC
  Administered 2021-05-07 – 2021-05-09 (×3): 10 mg via ORAL
  Filled 2021-05-07 (×3): qty 1

## 2021-05-07 MED ORDER — TAMSULOSIN HCL 0.4 MG PO CAPS
0.4000 mg | ORAL_CAPSULE | Freq: Every day | ORAL | Status: DC
  Administered 2021-05-07 – 2021-05-09 (×3): 0.4 mg via ORAL
  Filled 2021-05-07 (×3): qty 1

## 2021-05-07 MED ORDER — ALLOPURINOL 100 MG PO TABS
50.0000 mg | ORAL_TABLET | Freq: Every day | ORAL | Status: DC
  Administered 2021-05-07 – 2021-05-09 (×3): 50 mg via ORAL
  Filled 2021-05-07 (×3): qty 1

## 2021-05-07 MED ORDER — CYCLOSPORINE MODIFIED (NEORAL) 25 MG PO CAPS
75.0000 mg | ORAL_CAPSULE | Freq: Two times a day (BID) | ORAL | Status: DC
  Administered 2021-05-07 – 2021-05-09 (×5): 75 mg via ORAL
  Filled 2021-05-07 (×6): qty 3

## 2021-05-07 MED ORDER — ACETAMINOPHEN 160 MG/5ML PO SOLN: 650.0000 mg | ORAL | Status: DC | PRN

## 2021-05-07 MED ORDER — SODIUM BICARBONATE 650 MG PO TABS
650.0000 mg | ORAL_TABLET | Freq: Two times a day (BID) | ORAL | Status: DC
Start: 2021-05-07 — End: 2021-05-09
  Administered 2021-05-07 – 2021-05-09 (×5): 650 mg via ORAL
  Filled 2021-05-07 (×5): qty 1

## 2021-05-07 MED ORDER — ACETAMINOPHEN 325 MG PO TABS: 650.0000 mg | ORAL_TABLET | ORAL | Status: DC | PRN

## 2021-05-07 MED ORDER — PANTOPRAZOLE SODIUM 40 MG PO TBEC
40.0000 mg | DELAYED_RELEASE_TABLET | Freq: Every day | ORAL | Status: DC
  Administered 2021-05-07 – 2021-05-09 (×3): 40 mg via ORAL
  Filled 2021-05-07 (×3): qty 1

## 2021-05-07 MED ORDER — FERROUS SULFATE 325 (65 FE) MG PO TABS
325.0000 mg | ORAL_TABLET | Freq: Two times a day (BID) | ORAL | Status: DC
  Administered 2021-05-07 – 2021-05-09 (×5): 325 mg via ORAL
  Filled 2021-05-07 (×5): qty 1

## 2021-05-07 MED ORDER — EZETIMIBE 10 MG PO TABS
10.0000 mg | ORAL_TABLET | Freq: Every day | ORAL | Status: DC
  Administered 2021-05-07 – 2021-05-09 (×3): 10 mg via ORAL
  Filled 2021-05-07 (×3): qty 1

## 2021-05-07 MED ORDER — ACETAMINOPHEN 650 MG RE SUPP: 650.0000 mg | RECTAL | Status: DC | PRN

## 2021-05-07 NOTE — ED Notes (Signed)
Patient transported to MRI 

## 2021-05-07 NOTE — H&P (Signed)
History and Physical    Patient: Cristian Henderson EOF:[id number] DOB: 1945/11/15 DOA: 05/06/2021 DOS: the patient was seen and examined on 05/07/2021 PCP: [name], MD  Patient coming from: Home - lives with wife; [facility]: Wife, [phone]   Chief Complaint: Headache  HPI: Cristian Henderson is a 76 y.o. male with medical history significant of afib on Xarelto; renal transplant; HTN; and HLD presenting with headache.  He reports that he got up about 6AM yesterday with a terrible headache, blurred vision.  Things looked "wavy."  It was the worst headache of his life.  He laid around all day on the couch.  He was having trouble understanding what his wife was saying and she couldn't understand his speech.  It stayed that way until about 530 pm, when he came into the hospital.  He was given medication for headache and it has resolved completely.  January 1, he had the same problem.  It resolved more quickly the first time.  All of his symptoms are completely resolved.  His last dose of Xarelto was yesterday AM.    ER Course:  Carryover, per Dr. Bridgett Larsson:  76 year old gentleman with the worst headache of his life.  MRI of the brain was negative for stroke.  He is on Xarelto.  Neurology wants patient admitted for Xarelto washout and proceed with lumbar puncture to rule out subarachnoid hemorrhage.     Review of Systems: As mentioned in the history of present illness. All other systems reviewed and are negative. Past Medical History:  Diagnosis Date   Acid reflux    Anxiety    Arthritis    Atrial fibrillation (HCC)    Depression    Dysrhythmia    AFIB   Gout    High cholesterol    Hypertension    Kidney failure    PTSD (post-traumatic stress disorder)    Past Surgical History:  Procedure Laterality Date   ATRIAL FIBRILLATION ABLATION N/A 10/03/2020   Procedure: ATRIAL FIBRILLATION ABLATION;  Surgeon: Constance Haw, MD;  Location: [facility];  Service: Cardiovascular;   Laterality: N/A;   CYSTOSCOPY  08/07/2020   Procedure: CYSTOSCOPY, INSERTION OF FOLEY CATHETER;  Surgeon: Cleon Gustin, MD;  Location: [facility];  Service: Urology;;   KIDNEY TRANSPLANT Right 2013   REPLACEMENT TOTAL KNEE Right    SKIN CANCER EXCISION     TOTAL SHOULDER REPLACEMENT Left    Social History:  reports that he has never smoked. He has been exposed to tobacco smoke. He has never used smokeless tobacco. He reports that he does not currently use alcohol. He reports that he does not use drugs.  Allergies  Allergen Reactions   Doxycycline Hyclate Hives   Simvastatin Other (See Comments)    Pt states he loss all use of muscles and he was in the hospital for 10 days     Family History  Problem Relation Age of Onset   Alzheimer's disease Mother    Diabetes Mother     Prior to Admission medications   Medication Sig Start Date End Date Taking? Authorizing Provider  acetaminophen (TYLENOL) 500 MG tablet Take 1,000 mg by mouth every 6 (six) hours as needed for mild pain or moderate pain (Back pain).   Yes [provider]  allopurinol (ZYLOPRIM) 100 MG tablet Take 0.5 tablets (50 mg total) by mouth daily. 08/20/20  Yes McKenzie, Candee Furbish, MD  cholecalciferol (VITAMIN D3) 25 MCG (1000 UNIT) tablet Take 1,000 Units by mouth in  the morning and at bedtime.   Yes [provider]  cycloSPORINE modified (NEORAL) 25 MG capsule Take 75 mg by mouth 2 (two) times daily.   Yes [provider]  diltiazem (DILACOR XR) 240 MG 24 hr capsule Take 240 mg by mouth daily.   Yes [provider]  escitalopram (LEXAPRO) 10 MG tablet Take 1 tablet (10 mg total) by mouth daily. 04/25/20  Yes [name], MD  ezetimibe (ZETIA) 10 MG tablet Take 1 tablet (10 mg total) by mouth daily. 04/25/20  Yes [name], MD  ferrous sulfate 325 (65 FE) MG EC tablet Take 1 tablet by mouth 2 (two) times daily. 01/26/21  Yes [provider]  mycophenolate (MYFORTIC) 180 MG  EC tablet Take 180 mg by mouth 2 (two) times daily.   Yes [provider]  Omega-3 Fatty Acids (FISH OIL) 1200 MG CAPS Take 2,400 mg by mouth in the morning and at bedtime.   Yes [provider]  omeprazole (PRILOSEC) 40 MG capsule Take 40 mg by mouth daily.   Yes [provider]  predniSONE (DELTASONE) 20 MG tablet Take 1 tablet (20 mg total) by mouth daily with breakfast. 12/24/20  Yes [name], MD  Rivaroxaban (XARELTO) 15 MG TABS tablet Take 15 mg by mouth daily with breakfast.   Yes [provider]  sodium bicarbonate 650 MG tablet Take 650 mg by mouth 2 (two) times daily.   Yes [provider]  tamsulosin (FLOMAX) 0.4 MG CAPS capsule Take 1 capsule (0.4 mg total) by mouth daily. 11/28/20  Yes McKenzieCandee Furbish, MD    Physical Exam: Vitals:   05/07/21 0800 05/07/21 1038 05/07/21 1100 05/07/21 1228  BP: (!) 144/71 (!) 141/63 (!) 144/62 136/79  Pulse: (!) 54  62 64  Resp: 15  19   Temp:    98 F (36.7 C)  TempSrc:    Oral  SpO2: 97%  94% 98%  Weight:    72.6 kg  Height:    5\' 5"  (1.651 m)   General:  Appears calm and comfortable and is in NAD Eyes:  PERRL, EOMI, normal lids, iris ENT:  hard of hearing, grossly normal lips & tongue, mmm Neck:  no LAD, masses or thyromegaly Cardiovascular:  RRR, no m/r/g. No LE edema.  Respiratory:   CTA bilaterally with no wheezes/rales/rhonchi.  Normal respiratory effort. Abdomen:  soft, NT, ND, NABS Back:   normal alignment, no CVAT Skin:  no rash or induration seen on limited exam Musculoskeletal:  grossly normal tone BUE/BLE, good ROM, no bony abnormality Psychiatric:  grossly normal mood and affect, speech fluent and appropriate, AOx3 Neurologic:  CN 2-12 grossly intact, moves all extremities in coordinated fashion   Radiological Exams on Admission: Independently reviewed - see discussion in A/P where applicable  CT ANGIO HEAD NECK W WO CM  Result Date: 05/06/2021 CLINICAL DATA:   Headache and blurred vision, confusion EXAM: CT ANGIOGRAPHY HEAD AND NECK TECHNIQUE: Multidetector CT imaging of the head and neck was performed using the standard protocol during bolus administration of intravenous contrast. Multiplanar CT image reconstructions and MIPs were obtained to evaluate the vascular anatomy. Carotid stenosis measurements (when applicable) are obtained utilizing NASCET criteria, using the distal internal carotid diameter as the denominator. RADIATION DOSE REDUCTION: This exam was performed according to the departmental dose-optimization program which includes automated exposure control, adjustment of the mA and/or kV according to patient size and/or use of iterative reconstruction technique. CONTRAST:  29mL OMNIPAQUE IOHEXOL 350 MG/ML SOLN COMPARISON:  No prior CTA, correlation is made with CT head 05/06/2021. FINDINGS: CT HEAD FINDINGS For noncontrast findings, please see same day CT head. CTA NECK FINDINGS Aortic arch: Standard branching. Imaged portion shows no evidence of aneurysm or dissection. No significant stenosis of the major arch vessel origins. Aortic atherosclerosis Right carotid system: No evidence of dissection, stenosis (50% or greater) or occlusion. Left carotid system: No evidence of dissection, stenosis (50% or greater) or occlusion. Vertebral arteries: The left vertebral artery is not visualized in the V1 and proximal V2 segments, with faint opacification in the mid and distal V2 segments, with more robust opacification in the V3 and V4 segments. The right vertebral artery is unremarkable. Skeleton: No acute osseous abnormality. Other neck: Negative. Upper chest: Calcified lesion in the right upper lobe, likely sequela of prior granulomatous disease. No pleural effusion or focal pulmonary opacity. Review of the MIP images confirms the above findings CTA HEAD FINDINGS Anterior circulation: Both internal carotid arteries are patent to the termini, with mild calcifications  but without significant stenosis. A1 segments patent. Normal anterior communicating artery. Anterior cerebral arteries are patent to their distal aspects. No M1 stenosis or occlusion. Normal MCA bifurcations. Mild narrowing at the origin of a right M2 branch (series 7, image 113). Distal MCA branches otherwise perfused and symmetric. Posterior circulation: Moderate to severe focal stenosis in the proximal left V4 (series 7, image 159). Vertebral arteries otherwise patent to the vertebrobasilar junction without stenosis. Posterior inferior cerebral arteries patent bilaterally. Basilar patent to its distal aspect. Superior cerebellar arteries patent bilaterally. Patent P1 segments. PCAs perfused to their distal aspects without stenosis. The bilateral posterior communicating arteries are not visualized. Venous sinuses: As permitted by contrast timing, patent. Anatomic variants: None significant Review of the MIP images confirms the above findings IMPRESSION: 1. Non opacification of the proximal left vertebral artery, in its V1 and proximal V2 segments, with faint opacification in the mid and distal V2 segments and more robust opacification in the V3 and V4 segments. 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 The right vertebral artery is unremarkable. 2.  No intracranial large vessel occlusion or significant stenosis. 3.  No other hemodynamically significant stenosis in the neck. Electronically Signed   By: Merilyn Baba M.D.   On: 05/06/2021 23:26   CT HEAD WO CONTRAST  Result Date: 05/06/2021 CLINICAL DATA:  Altered mental status.  Headache.  Blurred vision. EXAM: CT HEAD WITHOUT CONTRAST TECHNIQUE: Contiguous axial images were obtained from the base of the skull through the vertex without intravenous contrast. RADIATION DOSE REDUCTION: This exam was performed according to the departmental dose-optimization program which includes automated exposure control, adjustment of the  mA and/or kV according to patient size and/or use of iterative reconstruction technique. COMPARISON:  None. FINDINGS: Brain: Minimally enlarged ventricles and subarachnoid spaces. Minimal patchy white matter low density in both cerebral hemispheres. No intracranial hemorrhage, mass lesion or CT evidence of acute infarction. Vascular: No hyperdense vessel or unexpected calcification. Skull: Normal. Negative for fracture or focal lesion. Sinuses/Orbits: Status post bilateral cataract extraction. Unremarkable bones and included paranasal sinuses. Other: None. IMPRESSION: 1. No acute abnormality. 2. Minimal diffuse cerebral and cerebellar atrophy. 3. Minimal chronic small vessel white matter ischemic changes in both cerebral hemispheres. Electronically Signed   By: Claudie Revering M.D.   On: 05/06/2021 18:43   MR BRAIN WO CONTRAST  Result Date: 05/07/2021 CLINICAL DATA:  76 year old male  altered mental status. Headache and blurred vision. Confusion. EXAM: MRI HEAD WITHOUT CONTRAST TECHNIQUE: Multiplanar, multiecho pulse sequences of the brain and surrounding structures were obtained without intravenous contrast. COMPARISON:  CT head and CTA head and neck yesterday. FINDINGS: Brain: Cerebral volume is within normal limits for age. No restricted diffusion to suggest acute infarction. No midline shift, mass effect, evidence of mass lesion, ventriculomegaly, extra-axial collection or acute intracranial hemorrhage. Cervicomedullary junction and pituitary are within normal limits. Mild for age patchy and scattered bilateral cerebral white matter T2 and FLAIR hyperintensity, maximal in the left centrum semiovale. No cortical encephalomalacia or chronic cerebral blood products. Normal deep gray matter nuclei. Normal brainstem. There is a small chronic linear lacunar infarct of the right cerebellum on series 10, image 7. Vascular: Absent distal left vertebral artery flow void, concordant with abnormal cervical left vertebral on  the CTA yesterday. Evidence that the left V4 segment is reconstituted as on CTA. Dominant appearing right vertebral artery. Other Major intracranial vascular flow voids are preserved. Skull and upper cervical spine: Negative. Visualized bone marrow signal is within normal limits. Sinuses/Orbits: Postoperative changes to both globes. Paranasal Visualized paranasal sinuses and mastoids are stable and well aerated. Other: Visible internal auditory structures appear normal. Negative visible scalp and face. IMPRESSION: 1. No acute intracranial abnormality. 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 Otherwise mild for age nonspecific cerebral white matter signal changes. 2. Poor flow in the left vertebral artery at the skull base, with left V4 segment reconstitution concordant with CTA findings yesterday. Electronically Signed   By: Genevie Ann M.D.   On: 05/07/2021 04:58   MR MRV HEAD W WO CONTRAST  Result Date: 05/07/2021 CLINICAL DATA:  Dural venous sinus thrombosis suspected EXAM: MR VENOGRAM HEAD WITHOUT AND WITH CONTRAST TECHNIQUE: Angiographic images of the intracranial venous structures were acquired using MRV technique without and with intravenous contrast. CONTRAST:  7.26mL GADAVIST GADOBUTROL 1 MMOL/ML IV SOLN COMPARISON:  No pertinent prior exam. FINDINGS: No evidence of dural venous sinus thrombosis. The superior sagittal sinus, straight, transverse, and sigmoid sinuses are patent. Rounded arachnoid granulation in the distal left transverse sinus. IMPRESSION: No evidence of dural venous sinus thrombosis. Electronically Signed   By: Margaretha Sheffield M.D.   On: 05/07/2021 09:22    EKG: Independently reviewed.  Sinus bradycardia with rate 53; no evidence of acute ischemia   Labs on Admission: I have personally reviewed the available labs and imaging studies at the time of the admission.  Pertinent labs:    Glucose 138 BUN 29/Creatinine 1.38/GFR 53 Normal CBC INR 2.4 COVID/flu negative UA  unremarkable UDS negative ETOH <10    Assessment and Plan: * Headache- (present on admission) -Patient presenting with the worst headache of his life -Similar presentation earlier this month -Headache has since resolved -Normal MRI -MRV with some changes, will defer to neurology -Observation on telemetry with neurology plan for LP to r/o SAH  CKD (chronic kidney disease), stage III ([place])- (present on admission) -Stage 3a CKD s/p transplant -Appears to be stable at this time -Will follow  Hyperlipidemia- (present on admission) -Continue Zetia  Essential (primary) hypertension- (present on admission) -Continue Cardizem  Kidney transplant status -Likely unrelated to current complaint, but if any infectious symptoms will need to evaluate for meningoencephalitis since he is immunocompromised -Continue home meds - prednisone, Myfortic, cyclosporine  Paroxysmal atrial fibrillation ([place])- (present on admission) -Rate controlled with diltiazem -Hold Xarelto in anticipation of LP - likely tomorrow AM  Advance Care Planning:   Code Status: Full Code   Consults: Neurology  Family Communication: None present; he reports speaking to his wife recently  Severity of Illness: The appropriate patient status for this patient is OBSERVATION. Observation status is judged to be reasonable and necessary in order to provide the required intensity of service to ensure the patient's safety. The patient's presenting symptoms, physical exam findings, and initial radiographic and laboratory data in the context of their medical condition is felt to place them at decreased risk for further clinical deterioration. Furthermore, it is anticipated that the patient will be medically stable for discharge from the hospital within 2 midnights of admission.   Author: Karmen Bongo, MD 05/07/2021 7:10 PM  For on call review [email or web address].

## 2021-05-07 NOTE — Assessment & Plan Note (Addendum)
Severe. No DVST on MRV or cerebral edema from any cause. Needs LP to r/o SAH. Unsuccessful LP 2/3 by neurology.  - LP under fluoroscopy planned 2/4. NPO p MN, hold lovenox DVT ppx in AM until at least 4 hours after procedure.  HA fortunately resolved.

## 2021-05-07 NOTE — Assessment & Plan Note (Addendum)
-   Continue home meds - prednisone, Myfortic, cyclosporine

## 2021-05-07 NOTE — ED Provider Notes (Signed)
MRI w/o acute process. Seen by Neurology who recommended admission for [facility] rule out, but will need to washout AC prior to LP. Medicine admit   Cristian Blank, MD 05/07/21 [phone]

## 2021-05-07 NOTE — Plan of Care (Signed)
°  Problem: Coping: Goal: Will verbalize positive feelings about self Outcome: Progressing Goal: Will identify appropriate support needs Outcome: Progressing   Problem: Ischemic Stroke/TIA Tissue Perfusion: Goal: Complications of ischemic stroke/TIA will be minimized Outcome: Progressing

## 2021-05-07 NOTE — Assessment & Plan Note (Addendum)
-   Rate controlled with diltiazem - Holding xarelto for LP.

## 2021-05-07 NOTE — Assessment & Plan Note (Signed)
Continue Zetia. °

## 2021-05-07 NOTE — Progress Notes (Signed)
°  Transition of Care [facility]) Screening Note   Patient Details  Name: Cristian Henderson Date of Birth: January 12, 1946   Transition of Care [facility]) CM/SW Contact:    Pollie Friar, RN Phone Number: 05/07/2021, 2:00 PM    Transition of Care Department [facility]) has reviewed patient and no TOC needs have been identified at this time. We will continue to monitor patient advancement through interdisciplinary progression rounds. If new patient transition needs arise, please place a TOC consult.

## 2021-05-07 NOTE — Consult Note (Signed)
NEUROLOGY CONSULTATION NOTE   Date of service: May 07, 2021 Patient Name: Cristian Henderson MRN:  [id number] DOB:  1946-01-05 Reason for consult: "worst headache of life and episode of confusion" Requesting Provider: Regan Lemming, MD _ _ _   _ __   _ __ _ _  __ __   _ __   __ _  History of Present Illness  Cristian Henderson is a 76 y.o. male with PMH significant for GERD, depression, gout, hyperlipidemia, hypertension, afibb on Xarelto who woke up with worst headache of his life which was maximal at onset with associated blurred vision and some photophobia. The headache was 12 out of 10 in intensity and was persistent. In the afternoon, he developed confusion for a couple hours with dry mouth, slurred speech and trouble paying attention and focusing and some word finding difficulty. He has never had headache like this in the past.  He finally came to the ED where he had a CTH w/o contrast which was negative for ICH. MRI Brain without contrast with no acute abnromalities.  He is not someone who has a headache.   Wife at bedside reports that in early January, he had some headche and confusion that lasted maybe an hour.  He is on Xarelto and takes it every morning. He took Xarelto on 05/06/21 at 0800.   ROS   Constitutional Denies weight loss, fever and chills.   HEENT Denies changes in vision and hearing.   Respiratory Denies SOB and cough.   CV Denies palpitations and CP   GI Denies abdominal pain, nausea, vomiting and diarrhea.   GU Denies dysuria and urinary frequency.   MSK Denies myalgia and joint pain.   Skin Denies rash and pruritus.   Neurological Denies headache and syncope.   Psychiatric Denies recent changes in mood. Denies anxiety and depression.    Past History   Past Medical History:  Diagnosis Date   Acid reflux    Anxiety    Arthritis    Atrial fibrillation (HCC)    Depression    Dysrhythmia    AFIB   Gout    High cholesterol    Hypertension    Kidney failure     PTSD (post-traumatic stress disorder)    Past Surgical History:  Procedure Laterality Date   ATRIAL FIBRILLATION ABLATION N/A 10/03/2020   Procedure: ATRIAL FIBRILLATION ABLATION;  Surgeon: Constance Haw, MD;  Location: [facility];  Service: Cardiovascular;  Laterality: N/A;   CYSTOSCOPY  08/07/2020   Procedure: CYSTOSCOPY, INSERTION OF FOLEY CATHETER;  Surgeon: Cleon Gustin, MD;  Location: [facility];  Service: Urology;;   KIDNEY TRANSPLANT Right 2013   REPLACEMENT TOTAL KNEE Right    SKIN CANCER EXCISION     TOTAL SHOULDER REPLACEMENT Left    Family History  Problem Relation Age of Onset   Alzheimer's disease Mother    Diabetes Mother    Social History   Socioeconomic History   Marital status: Married    Spouse name: Cristian Henderson   Number of children: 3   Years of education: 12   Highest education level: Associate degree: academic program  Occupational History   Not on file  Tobacco Use   Smoking status: Never    Passive exposure: Past   Smokeless tobacco: Never  Vaping Use   Vaping Use: Never used  Substance and Sexual Activity   Alcohol use: Not Currently   Drug use: Never   Sexual activity: Yes  Other Topics Concern  Not on file  Social History Narrative   Not on file   Social Determinants of Health   Financial Resource Strain: Low Risk    Difficulty of Paying Living Expenses: Not hard at all  Food Insecurity: No Food Insecurity   Worried About Charity fundraiser in the Last Year: Never true   Arboriculturist in the Last Year: Never true  Transportation Needs: No Transportation Needs   Lack of Transportation (Medical): No   Lack of Transportation (Non-Medical): No  Physical Activity: Unknown   Days of Exercise per Week: 3 days   Minutes of Exercise per Session: Not on file  Stress: Not on file  Social Connections: Moderately Integrated   Frequency of Communication with Friends and Family: More than three times a week   Frequency of Social  Gatherings with Friends and Family: Twice a week   Attends Religious Services: More than 4 times per year   Active Member of Clubs or Organizations: No   Attends Archivist Meetings: Never   Marital Status: Married   Allergies  Allergen Reactions   Doxycycline Hyclate Hives   Simvastatin Other (See Comments)    Pt states he loss all use of muscles and he was in the hospital for 10 days     Medications  (Not in a hospital admission)    Vitals   Vitals:   05/07/21 0200 05/07/21 0300 05/07/21 0345 05/07/21 0615  BP: (!) 145/76 133/67 140/72 (!) 159/77  Pulse: (!) 55 (!) 52 (!) 56 60  Resp: 15 (!) 9 19 15   Temp:      TempSrc:      SpO2: 96% 95% 95% 98%     There is no height or weight on file to calculate BMI.  Physical Exam   General: Laying comfortably in bed; in no acute distress.  HENT: Normal oropharynx and mucosa. Normal external appearance of ears and nose.  Neck: Supple, no pain or tenderness  CV: No JVD. No peripheral edema.  Pulmonary: Symmetric Chest rise. Normal respiratory effort.  Abdomen: Soft to touch, non-tender.  Ext: No cyanosis, edema, or deformity  Skin: No rash. Normal palpation of skin.   Musculoskeletal: Normal digits and nails by inspection. No clubbing.   Neurologic Examination  Mental status/Cognition: Alert, oriented to self, place, month and year, good attention.  Speech/language: Fluent, comprehension intact, object naming intact, repetition intact.  Cranial nerves:   CN II Pupils equal and reactive to light, no VF deficits    CN III,IV,VI EOM intact, no gaze preference or deviation, no nystagmus    CN V normal sensation in V1, V2, and V3 segments bilaterally    CN VII no asymmetry, no nasolabial fold flattening    CN VIII normal hearing to speech    CN IX & X normal palatal elevation, no uvular deviation    CN XI 5/5 head turn and 5/5 shoulder shrug bilaterally    CN XII midline tongue protrusion    Motor:  Muscle bulk:  normal, tone normal, pronator drift none tremor none Mvmt Root Nerve  Muscle Right Left Comments  SA C5/6 Ax Deltoid 5 5   EF C5/6 Mc Biceps 5 5   EE C6/7/8 Rad Triceps 5 5   WF C6/7 Med FCR     WE C7/8 PIN ECU     F Ab C8/T1 U ADM/FDI 5 5   HF L1/2/3 Fem Illopsoas 5 5   KE L2/3/4 Fem Quad 5 5  DF L4/5 D Peron Tib Ant 5 5   PF S1/2 Tibial Grc/Sol 5 5    Reflexes:  Right Left Comments  Pectoralis      Biceps (C5/6) 2 2   Brachioradialis (C5/6) 2 2    Triceps (C6/7) 2 2    Patellar (L3/4) 2 2    Achilles (S1)      Hoffman      Plantar     Jaw jerk    Sensation:  Light touch Intact throughout   Pin prick    Temperature    Vibration   Proprioception    Coordination/Complex Motor:  - Finger to Nose intact BL - Heel to shin intact BL - Rapid alternating movement are normal - Gait: Stride length short. Arm swing poor. Base width narrow.  Labs   CBC:  Recent Labs  Lab 05/06/21 1741 05/06/21 1806  WBC 7.5  --   NEUTROABS 4.4  --   HGB 13.7 13.9  HCT 40.2 41.0  MCV 92.6  --   PLT 194  --     Basic Metabolic Panel:  Lab Results  Component Value Date   NA 138 05/06/2021   K 4.3 05/06/2021   CO2 22 05/06/2021   GLUCOSE 138 (H) 05/06/2021   BUN 34 (H) 05/06/2021   CREATININE 1.30 (H) 05/06/2021   CALCIUM 9.4 05/06/2021   GFRNONAA 53 (L) 05/06/2021   Lipid Panel: No results found for: LDLCALC HgbA1c: No results found for: HGBA1C Urine Drug Screen:     Component Value Date/Time   LABOPIA NONE DETECTED 05/06/2021 2238   [place] DETECTED 05/06/2021 2238   [place] DETECTED 05/06/2021 2238   AMPHETMU NONE DETECTED 05/06/2021 2238   [place] DETECTED 05/06/2021 2238   LABBARB NONE DETECTED 05/06/2021 2238    Alcohol Level     Component Value Date/Time   ETH <10 05/06/2021 1741    CT Head without contrast(Personally reviewed): CTH was negative for a large hypodensity concerning for a large territory infarct or hyperdensity concerning for  an [place]  CT angio Head and Neck with contrast(Personally reviewed): No LVO, no obvious aneurysm  MRI Brain(Personally reviewed): No acute stroke Impression   Cristian Henderson is a 76 y.o. male with PMH significant for GERD, depression, gout, hyperlipidemia, hypertension, afibb on Xarelto and s/p renal transplant who woke up with worst headache of his life which was maximal at onset with associated blurred vision. He also had episode of confusion in the afternoon.  Althou his CTH was negative, this was done several hours after symptoms onset and the sensitivity of CTH for a SAH decreases after 6 hours. He would need an LP to rule out SAH. Unfurtunately with him being on Xarelto, will need to wait 48 hours before attempting LP and given concern for [facility], I would hold off on doing a heparin bridge at this time. In addition, will also get a MR Venogram head to evaluate for dural sinus venous thrombosis which is another cause for thunderclap headache. RCVS could be another cause as he is on immunosuppression with mycophenolate and cyclosporine but these are not new and he has been on it since kidney transplant in 2013.  Recommendations  - MR Venogram head. - Hold home Xarelto - Will need LP about 48 hours after his last dose of Xarelto. ________________________________________________________  Plan discussed with Dr. Leonette Monarch with the ED team.   Thank you for the opportunity to take part in the care of this patient. If you  have any further questions, please contact the neurology consultation attending.  Signed,  [place] Pager Number [phone] _ _ _   _ __   _ __ _ _  __ __   _ __   __ _

## 2021-05-07 NOTE — Assessment & Plan Note (Addendum)
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  - Avoid nephrotoxins.

## 2021-05-07 NOTE — Assessment & Plan Note (Signed)
Continue Cardizem. 

## 2021-05-08 DIAGNOSIS — R410 Disorientation, unspecified: Secondary | ICD-10-CM

## 2021-05-08 DIAGNOSIS — R519 Headache, unspecified: Secondary | ICD-10-CM

## 2021-05-08 DIAGNOSIS — E782 Mixed hyperlipidemia: Secondary | ICD-10-CM

## 2021-05-08 DIAGNOSIS — G4459 Other complicated headache syndrome: Secondary | ICD-10-CM | POA: Diagnosis not present

## 2021-05-08 DIAGNOSIS — R4781 Slurred speech: Secondary | ICD-10-CM | POA: Diagnosis not present

## 2021-05-08 DIAGNOSIS — N1832 Chronic kidney disease, stage 3b: Secondary | ICD-10-CM | POA: Diagnosis not present

## 2021-05-08 DIAGNOSIS — I10 Essential (primary) hypertension: Secondary | ICD-10-CM | POA: Diagnosis not present

## 2021-05-08 LAB — PROTIME-INR
INR: 1 (ref 0.8–1.2)
Prothrombin Time: 13.7 seconds (ref 11.4–15.2)

## 2021-05-08 MED ORDER — LIDOCAINE HCL (PF) 1 % IJ SOLN: 5.0000 mL | Freq: Once | INTRAMUSCULAR | Status: DC

## 2021-05-08 MED ORDER — LIDOCAINE HCL (PF) 1 % IJ SOLN
INTRAMUSCULAR | Status: AC
  Administered 2021-05-08: 5 mL
  Filled 2021-05-08: qty 5

## 2021-05-08 MED ORDER — LIDOCAINE 1% INJECTION FOR CIRCUMCISION: 3.0000 mL | INJECTION | Freq: Once | INTRAVENOUS | Status: DC

## 2021-05-08 MED ORDER — LIDOCAINE HCL 1 % IJ SOLN
5.0000 mL | Freq: Once | INTRAMUSCULAR | Status: DC
  Filled 2021-05-08: qty 5

## 2021-05-08 NOTE — Care Management Obs Status (Signed)
[facility] NOTIFICATION   Patient Details  Name: Cristian Henderson MRN: [id number] Date of Birth: Dec 27, 1945   Medicare Observation Status Notification Given:  Yes    Geralynn Ochs, LCSW 05/08/2021, 10:36 AM

## 2021-05-08 NOTE — Progress Notes (Addendum)
Neurology Progress Note  Brief HPI: Cristian Henderson is a 76 yo male w/ PMHx of GERD, depression, gout, HLD, HTN, Afib on Xarelto who had woken up with worst headache of his life with associated blurred vision and some photophobia. Patient later that day developed confusion for a few hours with dry mouth, slurred speech, inattention, and word finding difficulties.    Subjective: Wife is at bedside. Patient reports that the headache symptoms had occurred once before on 04/05/21 but had subsided after a few hours. Patient reports he currently feels much better than he had upon admission. Patient feels he is back to baseline at this time and denies any confusion or word finding difficulties at this time.  Exam: Vitals:   05/08/21 1205 05/08/21 1554  BP: 137/76 (!) 147/79  Pulse: 65 (!) 59  Resp: 19 20  Temp: 97.8 F (36.6 C) 98 F (36.7 C)  SpO2: 99% 99%   Gen: In bed, NAD Resp: non-labored breathing, no acute distress Abd: soft, nt  Neuro: Mental Status: alert and oriented x4, good attention Cranial Nerves:2-12 intact Motor: 5/5 in all extremities, normal bulk, no tremor Sensory: sensation equal bilaterally DTR: no abnormal reflexes detected Gait: Deferred  Pertinent Labs: CBC    Component Value Date/Time   WBC 7.5 05/06/2021 1741   RBC 4.34 05/06/2021 1741   HGB 13.9 05/06/2021 1806   HGB 11.2 (L) 09/24/2020 0000   HCT 41.0 05/06/2021 1806   HCT 32.9 (L) 09/24/2020 0000   PLT 194 05/06/2021 1741   PLT 182 09/24/2020 0000   MCV 92.6 05/06/2021 1741   MCV 88 09/24/2020 0000   MCH 31.6 05/06/2021 1741   MCHC 34.1 05/06/2021 1741   RDW 14.3 05/06/2021 1741   RDW 15.2 09/24/2020 0000   LYMPHSABS 1.9 05/06/2021 1741   MONOABS 0.9 05/06/2021 1741   EOSABS 0.2 05/06/2021 1741   BASOSABS 0.0 05/06/2021 1741   BMP Latest Ref Rng & Units 05/06/2021 05/06/2021 09/24/2020  Glucose 70 - 99 mg/dL 138(H) 138(H) 131(H)  BUN 8 - 23 mg/dL 34(H) 29(H) 26  Creatinine 0.61 - 1.24 mg/dL  1.30(H) 1.38(H) 1.47(H)  BUN/Creat Ratio 10 - 24 - - 18  Sodium 135 - 145 mmol/L 138 135 138  Potassium 3.5 - 5.1 mmol/L 4.3 4.2 4.5  Chloride 98 - 111 mmol/L 104 103 101  CO2 22 - 32 mmol/L - 22 21  Calcium 8.9 - 10.3 mg/dL - 9.4 9.4    Imaging Reviewed: MRI shows no acute intracranial abnormality. 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 Poor flow in left vertebral artery at skull base.   MRV No evidence of dural venous sinus thrombosis.  CT Head: No acute abnormality CTA Head/Neck: Non opacification of the proximal left vertebral artery, in its V1 and proximal V2 segments, with faint opacification in the mid and distal V2 segments and more robust opacification in the V3 and V4 segments. 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 No LVO, no hemodynamically significant stenosis.  Assessment: Cristian Henderson is a 76 y.o. male with PMH significant for GERD, depression, gout, hyperlipidemia, hypertension, afibb on Xarelto and s/p renal transplant who woke up with worst headache of his life which was maximal at onset with associated blurred vision. His symptoms have since resolved. LP performed today unsuccessful. Plan for fluoro-guided LP tomorrow morning to definitively r/o SAH.   Recommendations: 1)Plan for fluoro-guided LP tomorrow AM 2) Hold Lovenox DVT until at least 4 hours after  procedure. NPO past midnight for LP tomorrow  France Ravens, MD PGY1 Resident

## 2021-05-08 NOTE — TOC Transition Note (Signed)
Transition of Care [facility]) - CM/SW Discharge Note   Patient Details  Name: Cristian Henderson MRN: [id number] Date of Birth: [birth year]  [facility] CM/SW Contact:  Geralynn Ochs, LCSW Phone Number: 05/08/2021, 10:50 AM   Clinical Narrative:   CSW met with patient and spouse at bedside, who had concerns about medical records and unable to get access to MyChart. CSW provided number for MyChart help desk, as well as medical records release form for patient records. Patient hopeful for discharge back home today. No other needs identified.    Final next level of care: Home/Self Care Barriers to Discharge: Barriers Resolved   Patient Goals and CMS Choice Patient states their goals for this hospitalization and ongoing recovery are:: to go home CMS Medicare.gov Compare Post Acute Care list provided to:: Patient Choice offered to / list presented to : Patient, Spouse  Discharge Placement                Patient to be transferred to facility by: Family Name of family member notified: Self Patient and family notified of of transfer: 05/08/21  Discharge Plan and Services                                     Social Determinants of Health (SDOH) Interventions     Readmission Risk Interventions No flowsheet data found.

## 2021-05-08 NOTE — Progress Notes (Signed)
Radiology APP aware of request for fluoro guided LP after unsuccessful attempt on the floor today.  Patient noted to have INR 2.4 on 05/06/21, Xarelto has been held x 2 days.   Will repeat INR this evening and radiology APP will review results in AM to determine if it is safe to proceed with LP.  Candiss Norse, PA-C

## 2021-05-08 NOTE — Progress Notes (Signed)
Progress Note  Patient: Cristian Henderson XLK:[id number] DOB: 12-18-1945 DOA: 05/06/2021  DOS: 05/08/2021  LOS: 0 days   Brief hospital course: HPI: Cristian Henderson is a 76 y.o. male with medical history significant of afib on Xarelto; renal transplant; HTN; and HLD presenting with headache.  He reports that he got up about 6AM yesterday with a terrible headache, blurred vision.  Things looked "wavy."  It was the worst headache of his life.  He laid around all day on the couch.  He was having trouble understanding what his wife was saying and she couldn't understand his speech.  It stayed that way until about 530 pm, when he came into the hospital.  He was given medication for headache and it has resolved completely.  January 1, he had the same problem.  It resolved more quickly the first time.  All of his symptoms are completely resolved. Neurology recommended MRI, MRV which were nonacute. LP was attempted after xarelto washout on 2/3, unsuccessful. Planning LP to definitively rule out SAH on 2/4 under fluoroscopy guidance.   Assessment and Plan: * Headache- (present on admission) Severe. No DVST on MRV or cerebral edema from any cause. Needs LP to r/o SAH. Unsuccessful LP 2/3 by neurology.  - LP under fluoroscopy planned 2/4. NPO p MN, hold lovenox DVT ppx in AM until at least 4 hours after procedure.  HA fortunately resolved.   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  - Avoid nephrotoxins.  Hyperlipidemia- (present on admission) -Continue Zetia  Essential (primary) hypertension- (present on admission) -Continue Cardizem  Kidney transplant status - Continue home meds - prednisone, Myfortic, cyclosporine  Paroxysmal atrial fibrillation ([place])- (present on admission) - Rate controlled with diltiazem - Holding xarelto for LP.  Subjective: Feels  back to normal. No HA, vision changes, hearing changes, chest pain, dyspnea, N/V/D.  Objective: Vitals:   05/08/21 0331 05/08/21 0901 05/08/21 1205 05/08/21 1554  BP: (!) 147/70 (!) 149/68 137/76 (!) 147/79  Pulse: (!) 58 60 65 (!) 59  Resp:  20 19 20   Temp: 98.2 F (36.8 C) 98 F (36.7 C) 97.8 F (36.6 C) 98 F (36.7 C)  TempSrc: Oral Oral Oral Oral  SpO2: 96% 98% 99% 99%  Weight:      Height:       Gen: Pleasant, well-appearing 76 y.o. male in no distress Pulm: Nonlabored breathing room air. Clear CV: Regular rate and rhythm. No murmur, rub, or gallop. No JVD, no dependent edema. GI: Abdomen soft, non-tender, non-distended, with normoactive bowel sounds.  Ext: Warm, no deformities Skin: No acute rashes, lesions or ulcers on visualized skin. 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 Left ear with excised lesion. Neuro: Alert and oriented. No focal neurological deficits. Psych: Judgement and insight appear intact. Mood euthymic & affect congruent. Behavior is appropriate.    Data Personally reviewed: Glucose 120 SCr 1.30 (better than recent baseline) LFTs wnl CBC wnl INR 2.4 UDS negative, EtOH negative UA unremarkable    CT ANGIO HEAD NECK W WO CM 05/06/2021 1. Non opacification of the proximal left vertebral artery, in its V1 and proximal V2 segments, with faint opacification in the mid and distal V2 segments and more robust opacification in the V3 and V4  segments. 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 The right vertebral artery is unremarkable. 2.  No intracranial large vessel occlusion or significant stenosis. 3.  No other hemodynamically significant stenosis in the neck.   CT HEAD WO CONTRAST 05/06/2021 1. No acute abnormality. 2. Minimal diffuse cerebral and  cerebellar atrophy. 3. Minimal chronic small vessel white matter ischemic changes in both cerebral hemispheres.    MR BRAIN WO CONTRAST 05/07/2021 1. No acute intracranial abnormality. 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 Otherwise mild for age nonspecific cerebral white matter signal changes. 2. Poor flow in the left vertebral artery at the skull base, with left V4 segment reconstitution concordant with CTA findings yesterday.    MR MRV HEAD W WO CONTRAST 05/07/2021 No evidence of dural venous sinus thrombosis.  SARS-CoV-2 PCR (2/2): Negative Influenza A/B: Negative  Family Communication: Wife by phone at bedside  Disposition: Status is: Inpatient Remains inpatient appropriate because: Continued work up with LP under fluoroscopy guidance 2/4 Planned Discharge Destination: Home  Patrecia Pour, MD 05/08/2021 4:52 PM Page by [email or web address]

## 2021-05-08 NOTE — Procedures (Signed)
LUMBAR PUNCTURE (SPINAL TAP) PROCEDURE NOTE  Indication: Diagnostic w/ concerns for [facility]   Proceduralists: Lynnae Sandhoff, MD, France Ravens, MD   Risks of the procedure were dicussed with the patient including post-LP headache, bleeding, infection, weakness/numbness of legs(radiculopathy), death.    Consent obtained from: patient    Procedure Note The patient was prepped and draped, and using sterile technique a 20 gauge quinke spinal needle was attempted to be inserted to L4-L5 space.    We were unsuccessful in obtaining CSF fluid. We have placed an order for fluoro-guided LP.   Patient tolerated the procedure well and blood loss was minimal.    France Ravens, MD PGY1 Resident

## 2021-05-09 ENCOUNTER — Observation Stay (HOSPITAL_COMMUNITY): Payer: Medicare Other

## 2021-05-09 DIAGNOSIS — G43109 Migraine with aura, not intractable, without status migrainosus: Secondary | ICD-10-CM

## 2021-05-09 DIAGNOSIS — I10 Essential (primary) hypertension: Secondary | ICD-10-CM | POA: Diagnosis not present

## 2021-05-09 DIAGNOSIS — G4459 Other complicated headache syndrome: Secondary | ICD-10-CM | POA: Diagnosis not present

## 2021-05-09 DIAGNOSIS — R519 Headache, unspecified: Secondary | ICD-10-CM | POA: Diagnosis not present

## 2021-05-09 DIAGNOSIS — E782 Mixed hyperlipidemia: Secondary | ICD-10-CM | POA: Diagnosis not present

## 2021-05-09 DIAGNOSIS — N1832 Chronic kidney disease, stage 3b: Secondary | ICD-10-CM | POA: Diagnosis not present

## 2021-05-09 LAB — CSF CELL COUNT WITH DIFFERENTIAL
RBC Count, CSF: 130 /mm3 — ABNORMAL HIGH
Tube #: 1
WBC, CSF: 6 /mm3 — ABNORMAL HIGH (ref 0–5)

## 2021-05-09 LAB — PROTEIN, CSF: Total  Protein, CSF: 36 mg/dL (ref 15–45)

## 2021-05-09 LAB — GLUCOSE, CSF: Glucose, CSF: 74 mg/dL — ABNORMAL HIGH (ref 40–70)

## 2021-05-09 NOTE — Plan of Care (Signed)
Patient ID: Cristian Henderson, male   DOB: [birth year], 76 y.o.   MRN: [id number]  Problem: Education: Goal: Knowledge of disease or condition will improve Outcome: Adequate for Discharge   Problem: Coping: Goal: Will verbalize positive feelings about self Outcome: Adequate for Discharge Goal: Will identify appropriate support needs Outcome: Adequate for Discharge   Problem: Self-Care: Goal: Ability to participate in self-care as condition permits will improve Outcome: Adequate for Discharge   Problem: Intracerebral Hemorrhage Tissue Perfusion: Goal: Complications of Intracerebral Hemorrhage will be minimized Outcome: Adequate for Discharge   Problem: Ischemic Stroke/TIA Tissue Perfusion: Goal: Complications of ischemic stroke/TIA will be minimized Outcome: Adequate for Discharge    Haydee Salter, RN

## 2021-05-09 NOTE — Plan of Care (Signed)
°  Problem: Education: Goal: Knowledge of disease or condition will improve Outcome: Progressing   Problem: Coping: Goal: Will verbalize positive feelings about self Outcome: Progressing Goal: Will identify appropriate support needs Outcome: Progressing   Problem: Self-Care: Goal: Ability to participate in self-care as condition permits will improve Outcome: Progressing   Problem: Intracerebral Hemorrhage Tissue Perfusion: Goal: Complications of Intracerebral Hemorrhage will be minimized Outcome: Progressing   Problem: Ischemic Stroke/TIA Tissue Perfusion: Goal: Complications of ischemic stroke/TIA will be minimized Outcome: Progressing

## 2021-05-09 NOTE — Progress Notes (Signed)
Extremely brief progress note  No overnight events or new complaints. His wife is at bedside.  Exam remains unchanged compared to yesterday Gen: In bed, NAD Resp: non-labored breathing, no acute distress Abd: soft, nt   Neuro: Mental Status: alert and oriented x4, good attention Cranial Nerves:2-12 intact Motor: 5/5 in all extremities, normal bulk, no tremor Sensory: sensation equal bilaterally DTR: no abnormal reflexes detected Gait: Deferred  Cristian Henderson is a 76 y.o. male with PMH significant for GERD, depression, gout, hyperlipidemia, hypertension, afib (Xarelto) s/p renal transplant who woke up with worst headache of his life.  LP tube 1 RBC 130 and WBC 6, Glu 74 not suggestive of SAH as there was no xanthochromia and proportionally appropriate cells.  He clarified his headache was present on waking at 0600 and ramped up to maximum ~0930. Pain was sharp with intensity 10/10 at worst. There was associated photophobia and nausea with blurry vision. No nuchal rigidity. He added that at the onset of pain there were grey wavy lines in the periphery of bilateral visual fields. He had a similar headache in January 2023 except not as intense.  His mother had migraines and his daughter had migraines.  In addition, his cardiologist was going to discontinue rivaroxaban as his ablation was successful however, with the headache in January cardiologist opted to wait until he saw neurology.  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 Sarina Ill in outpatient neurology in 1-2 weeks if possible, then follow up with cardiologist to discontinue rivaroxaban. He may aspirin 81mg  as there is a left vertebral artery with poor flow in setting of HLD.

## 2021-05-09 NOTE — Progress Notes (Signed)
Patient ID: Cristian Henderson, male   DOB: March 29, 1946, 76 y.o.   MRN: [id number]  No report received from radiology that procedure was completed. Patient now back in room, resting quietly. Educated on bedrest except for bathroom privileges. Fresh water pitcher and cups provided and patient encouraged to drink frequently to avoid headache. Npo status has been resolved and diet order placed.Wife concerned about Observation Status and upset the procedure could not be completed yesterday. Upon conversing with her, many opportunities for patient satisfaction service recovery were noted. Charge nurse informed and she has given them the number for patient relations.  Haydee Salter, RN

## 2021-05-09 NOTE — Discharge Summary (Signed)
Physician Discharge Summary   Patient: Cristian Henderson MRN: [id number] DOB: [birth year]  Admit date:     05/06/2021  Discharge date: 05/09/21  Discharge Physician: Patrecia Pour   PCP: [name], MD   Recommendations at discharge:  Follow up with PCP in next 1-2 weeks. Follow up with outpatient neurology, Dr. Jaynee Eagles. Referral placed at time of discharge.   Discharge Diagnoses: Principal Problem:   Headache Active Problems:   Paroxysmal atrial fibrillation [facility])   Kidney transplant status   Essential (primary) hypertension   Hyperlipidemia   CKD (chronic kidney disease), stage III [facility])  Hospital Course: Per HPI: Cristian Henderson is a 76 y.o. male with medical history significant of afib on Xarelto; renal transplant; HTN; and HLD presenting with headache.  He reports that he got up about 6AM yesterday with a terrible headache, blurred vision.  Things looked "wavy."  It was the worst headache of his life.  He laid around all day on the couch.  He was having trouble understanding what his wife was saying and she couldn't understand his speech.  It stayed that way until about 530 pm, when he came into the hospital.  He was given medication for headache and it has resolved completely.  January 1, he had the same problem.  It resolved more quickly the first time.  All of his symptoms are completely resolved. Neurology recommended MRI, MRV which were nonacute. LP was attempted after xarelto washout on 2/3. This was unsuccessful and repeated under fluoroscopic guidance in IR on 2/4. There was no xanthochromia noted and neurology suspects etiology of presentation is migraine. Outpatient neurology follow up is recommended LP to definitively rule out SAH on 2/4 under fluoroscopy guidance.   Assessment and Plan: * Headache- (present on admission) Severe. No DVST on MRV or cerebral edema from any cause. Unsuccessful LP 2/3 by neurology, performed under fluoro 2/4 showing no xanthochromia. Restart  anticoagulation 2/5 per IR. - HA fortunately resolved. Neuro follow up recommended.  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  - Avoid nephrotoxins.  Hyperlipidemia- (present on admission) -Continue Zetia  Essential (primary) hypertension- (present on admission) -Continue Cardizem  Kidney transplant status - Continue home meds - prednisone, Myfortic, cyclosporine  Paroxysmal atrial fibrillation ([place])- (present on admission) - Rate controlled with diltiazem - Holding xarelto for LP.   Consultants: Neurology Procedures performed:  Attempted bedside LP 2/3 LP w/fluoroscopy 2/4  Disposition: Home  DISCHARGE MEDICATION: Allergies as of 05/09/2021       Reactions   Doxycycline Hyclate Hives   Simvastatin Other (See Comments)   Pt states he loss all use of muscles and he was in the hospital for 10 days         Medication List     TAKE these medications    acetaminophen 500 MG tablet Commonly known as: TYLENOL Take 1,000 mg by mouth every 6 (six) hours as needed for mild pain or moderate pain (Back pain).   allopurinol 100 MG tablet Commonly known as: ZYLOPRIM Take 0.5 tablets (50 mg total) by mouth daily.   cholecalciferol 25 MCG (1000 UNIT) tablet Commonly known as: VITAMIN D3 Take 1,000 Units by mouth in the morning and at bedtime.   cycloSPORINE modified 25 MG capsule Commonly known as: NEORAL Take 75 mg by mouth 2 (two) times daily.   diltiazem 240 MG  24 hr capsule Commonly known as: DILACOR XR Take 240 mg by mouth daily.   escitalopram 10 MG tablet Commonly known as: Lexapro Take 1 tablet (10 mg total) by mouth daily.   ezetimibe 10 MG tablet Commonly known as: ZETIA Take 1 tablet (10 mg total) by mouth daily.   ferrous sulfate 325 (65 FE) MG EC tablet Take 1 tablet by mouth 2 (two) times daily.    Fish Oil 1200 MG Caps Take 2,400 mg by mouth in the morning and at bedtime.   mycophenolate 180 MG EC tablet Commonly known as: MYFORTIC Take 180 mg by mouth 2 (two) times daily.   omeprazole 40 MG capsule Commonly known as: PRILOSEC Take 40 mg by mouth daily.   predniSONE 20 MG tablet Commonly known as: DELTASONE Take 1 tablet (20 mg total) by mouth daily with breakfast.   Rivaroxaban 15 MG Tabs tablet Commonly known as: XARELTO Take 15 mg by mouth daily with breakfast.   sodium bicarbonate 650 MG tablet Take 650 mg by mouth 2 (two) times daily.   tamsulosin 0.4 MG Caps capsule Commonly known as: FLOMAX Take 1 capsule (0.4 mg total) by mouth daily.        Follow-up Information     Melvenia Beam, MD. Schedule an appointment as soon as possible for a visit in 1 week(s).   Specialty: Neurology Contact information: [address] [place]  [phone]         [name], MD Follow up.   Specialty: Internal Medicine Contact information: [place] [address] [phone]                Subjective: Patient has no complaints, has not had recurrence of headache since admission. No back pain mentioned. Discharge Exam: Filed Weights   05/07/21 1228  Weight: 72.6 kg   Nontoxic, no distress, pleasant male Clear, nonlabored RRR No focal deficits  Condition at discharge: good  The results of significant diagnostics from this hospitalization (including imaging, microbiology, ancillary and laboratory) are listed below for reference.   Imaging Studies: CT ANGIO HEAD NECK W WO CM  Result Date: 05/06/2021 CLINICAL DATA:  Headache and blurred vision, confusion EXAM: CT ANGIOGRAPHY HEAD AND NECK TECHNIQUE: Multidetector CT imaging of the head and neck was performed using the standard protocol during bolus administration of intravenous contrast. Multiplanar CT image reconstructions and MIPs were obtained to evaluate the  vascular anatomy. Carotid stenosis measurements (when applicable) are obtained utilizing NASCET criteria, using the distal internal carotid diameter as the denominator. RADIATION DOSE REDUCTION: This exam was performed according to the departmental dose-optimization program which includes automated exposure control, adjustment of the mA and/or kV according to patient size and/or use of iterative reconstruction technique. CONTRAST:  69mL OMNIPAQUE IOHEXOL 350 MG/ML SOLN COMPARISON:  No prior CTA, correlation is made with CT head 05/06/2021. FINDINGS: CT HEAD FINDINGS For noncontrast findings, please see same day CT head. CTA NECK FINDINGS Aortic arch: Standard branching. Imaged portion shows no evidence of aneurysm or dissection. No significant stenosis of the major arch vessel origins. Aortic atherosclerosis Right carotid system: No evidence of dissection, stenosis (50% or greater) or occlusion. Left carotid system: No evidence of dissection, stenosis (50% or greater) or occlusion. Vertebral arteries: The left vertebral artery is not visualized in the V1 and proximal V2 segments, with faint opacification in the mid and distal V2 segments, with more robust opacification in the V3 and V4 segments. The right vertebral  artery is unremarkable. Skeleton: No acute osseous abnormality. Other neck: Negative. Upper chest: Calcified lesion in the right upper lobe, likely sequela of prior granulomatous disease. No pleural effusion or focal pulmonary opacity. Review of the MIP images confirms the above findings CTA HEAD FINDINGS Anterior circulation: Both internal carotid arteries are patent to the termini, with mild calcifications but without significant stenosis. A1 segments patent. Normal anterior communicating artery. Anterior cerebral arteries are patent to their distal aspects. No M1 stenosis or occlusion. Normal MCA bifurcations. Mild narrowing at the origin of a right M2 branch (series 7, image 113). Distal MCA branches  otherwise perfused and symmetric. Posterior circulation: Moderate to severe focal stenosis in the proximal left V4 (series 7, image 159). Vertebral arteries otherwise patent to the vertebrobasilar junction without stenosis. Posterior inferior cerebral arteries patent bilaterally. Basilar patent to its distal aspect. Superior cerebellar arteries patent bilaterally. Patent P1 segments. PCAs perfused to their distal aspects without stenosis. The bilateral posterior communicating arteries are not visualized. Venous sinuses: As permitted by contrast timing, patent. Anatomic variants: None significant Review of the MIP images confirms the above findings IMPRESSION: 1. Non opacification of the proximal left vertebral artery, in its V1 and proximal V2 segments, with faint opacification in the mid and distal V2 segments and more robust opacification in the V3 and V4 segments. 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 The right vertebral artery is unremarkable. 2.  No intracranial large vessel occlusion or significant stenosis. 3.  No other hemodynamically significant stenosis in the neck. Electronically Signed   By: Merilyn Baba M.D.   On: 05/06/2021 23:26   CT HEAD WO CONTRAST  Result Date: 05/06/2021 CLINICAL DATA:  Altered mental status.  Headache.  Blurred vision. EXAM: CT HEAD WITHOUT CONTRAST TECHNIQUE: Contiguous axial images were obtained from the base of the skull through the vertex without intravenous contrast. RADIATION DOSE REDUCTION: This exam was performed according to the departmental dose-optimization program which includes automated exposure control, adjustment of the mA and/or kV according to patient size and/or use of iterative reconstruction technique. COMPARISON:  None. FINDINGS: Brain: Minimally enlarged ventricles and subarachnoid spaces. Minimal patchy white matter low density in both cerebral hemispheres. No intracranial hemorrhage, mass lesion or CT  evidence of acute infarction. Vascular: No hyperdense vessel or unexpected calcification. Skull: Normal. Negative for fracture or focal lesion. Sinuses/Orbits: Status post bilateral cataract extraction. Unremarkable bones and included paranasal sinuses. Other: None. IMPRESSION: 1. No acute abnormality. 2. Minimal diffuse cerebral and cerebellar atrophy. 3. Minimal chronic small vessel white matter ischemic changes in both cerebral hemispheres. Electronically Signed   By: Claudie Revering M.D.   On: 05/06/2021 18:43   MR BRAIN WO CONTRAST  Result Date: 05/07/2021 CLINICAL DATA:  76 year old male altered mental status. Headache and blurred vision. Confusion. EXAM: MRI HEAD WITHOUT CONTRAST TECHNIQUE: Multiplanar, multiecho pulse sequences of the brain and surrounding structures were obtained without intravenous contrast. COMPARISON:  CT head and CTA head and neck yesterday. FINDINGS: Brain: Cerebral volume is within normal limits for age. No restricted diffusion to suggest acute infarction. No midline shift, mass effect, evidence of mass lesion, ventriculomegaly, extra-axial collection or acute intracranial hemorrhage. Cervicomedullary junction and pituitary are within normal limits. Mild for age patchy and scattered bilateral cerebral white matter T2 and FLAIR hyperintensity, maximal in the left centrum semiovale. No cortical encephalomalacia or chronic cerebral blood products. Normal deep gray matter nuclei. Normal brainstem. There is a small chronic linear lacunar infarct  of the right cerebellum on series 10, image 7. Vascular: Absent distal left vertebral artery flow void, concordant with abnormal cervical left vertebral on the CTA yesterday. Evidence that the left V4 segment is reconstituted as on CTA. Dominant appearing right vertebral artery. Other Major intracranial vascular flow voids are preserved. Skull and upper cervical spine: Negative. Visualized bone marrow signal is within normal limits. Sinuses/Orbits:  Postoperative changes to both globes. Paranasal Visualized paranasal sinuses and mastoids are stable and well aerated. Other: Visible internal auditory structures appear normal. Negative visible scalp and face. IMPRESSION: 1. No acute intracranial abnormality. 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 Otherwise mild for age nonspecific cerebral white matter signal changes. 2. Poor flow in the left vertebral artery at the skull base, with left V4 segment reconstitution concordant with CTA findings yesterday. Electronically Signed   By: Genevie Ann M.D.   On: 05/07/2021 04:58   MR MRV HEAD W WO CONTRAST  Result Date: 05/07/2021 CLINICAL DATA:  Dural venous sinus thrombosis suspected EXAM: MR VENOGRAM HEAD WITHOUT AND WITH CONTRAST TECHNIQUE: Angiographic images of the intracranial venous structures were acquired using MRV technique without and with intravenous contrast. CONTRAST:  7.89mL GADAVIST GADOBUTROL 1 MMOL/ML IV SOLN COMPARISON:  No pertinent prior exam. FINDINGS: No evidence of dural venous sinus thrombosis. The superior sagittal sinus, straight, transverse, and sigmoid sinuses are patent. Rounded arachnoid granulation in the distal left transverse sinus. IMPRESSION: No evidence of dural venous sinus thrombosis. Electronically Signed   By: Margaretha Sheffield M.D.   On: 05/07/2021 09:22   DG FL GUIDED LUMBAR PUNCTURE  Result Date: 05/09/2021 CLINICAL DATA:  Headache. Concern for subarachnoid hemorrhage. Request image guided lumbar puncture. EXAM: DIAGNOSTIC LUMBAR PUNCTURE UNDER FLUOROSCOPIC GUIDANCE COMPARISON:  None FLUOROSCOPY: Fluoroscopy Time: 18 seconds Radiation Exposure Index (if provided by the fluoroscopic device): 3.1 mGy Number of Acquired Spot Images: 0 PROCEDURE: Informed consent was obtained from the patient prior to the procedure, including potential complications of headache, allergy, and pain. With the patient prone, the lower back was prepped with Betadine. 1% Lidocaine was used  for local anesthesia. Lumbar puncture was performed at the L2-L3 level using a 20 gauge needle with return of clear CSF with an opening pressure of 15 cm water. Ten ml of CSF were obtained for laboratory studies. The patient tolerated the procedure well and there were no apparent complications. IMPRESSION: Technically successful image guided lumbar puncture at the L2-L3 level without complication. Read by: Ascencion Dike PA-C Electronically Signed   By: Delanna Ahmadi M.D.   On: 05/09/2021 11:56    Microbiology: Results for orders placed or performed during the hospital encounter of 05/06/21  Resp Panel by RT-PCR (Flu A&B, Covid) Nasopharyngeal Swab     Status: None   Collection Time: 05/06/21  8:30 PM   Specimen: Nasopharyngeal Swab; Nasopharyngeal(NP) swabs in vial transport medium  Result Value Ref Range Status   SARS Coronavirus 2 by RT PCR NEGATIVE NEGATIVE Final    Comment: (NOTE) SARS-CoV-2 target nucleic acids are NOT DETECTED.  The SARS-CoV-2 RNA is generally detectable in upper respiratory specimens during the acute phase of infection. The lowest concentration of SARS-CoV-2 viral copies this assay can detect is 138 copies/mL. A negative result does not preclude SARS-Cov-2 infection and should not be used as the sole basis for treatment or other patient management decisions. A negative result may occur with  improper specimen collection/handling, submission of specimen other than nasopharyngeal swab, presence of viral mutation(s) within the areas targeted  by this assay, and inadequate number of viral copies(<138 copies/mL). A negative result must be combined with clinical observations, patient history, and epidemiological information. The expected result is Negative.  Fact Sheet for Patients:  EntrepreneurPulse.com.au  Fact Sheet for Healthcare Providers:  IncredibleEmployment.be  This test is no t yet approved or cleared by the [place]  FDA and  has been authorized for detection and/or diagnosis of SARS-CoV-2 by FDA under an Emergency Use Authorization (EUA). This EUA will remain  in effect (meaning this test can be used) for the duration of the COVID-19 declaration under Section 564(b)(1) of the Act, 21 U.S.C.section 360bbb-3(b)(1), unless the authorization is terminated  or revoked sooner.       Influenza A by PCR NEGATIVE NEGATIVE Final   Influenza B by PCR NEGATIVE NEGATIVE Final    Comment: (NOTE) The Xpert Xpress SARS-CoV-2/FLU/RSV plus assay is intended as an aid in the diagnosis of influenza from Nasopharyngeal swab specimens and should not be used as a sole basis for treatment. Nasal washings and aspirates are unacceptable for Xpert Xpress SARS-CoV-2/FLU/RSV testing.  Fact Sheet for Patients: EntrepreneurPulse.com.au  Fact Sheet for Healthcare Providers: IncredibleEmployment.be  This test is not yet approved or cleared by the [place] FDA and has been authorized for detection and/or diagnosis of SARS-CoV-2 by FDA under an Emergency Use Authorization (EUA). This EUA will remain in effect (meaning this test can be used) for the duration of the COVID-19 declaration under Section 564(b)(1) of the Act, 21 U.S.C. section 360bbb-3(b)(1), unless the authorization is terminated or revoked.  Performed at [facility], [place] [address] [place], [place] [address]   CSF culture w Gram Stain     Status: None (Preliminary result)   Collection Time: 05/09/21 11:45 AM   Specimen: PATH Cytology CSF; Cerebrospinal Fluid  Result Value Ref Range Status   Specimen Description CSF  Final   Special Requests NONE  Final   Gram Stain   Final    WBC PRESENT, PREDOMINANTLY MONONUCLEAR NO ORGANISMS SEEN CYTOSPIN SMEAR Performed at [facility], [place] [address] [place], [place] [address]    Culture PENDING  Incomplete   Report Status PENDING  Incomplete     Labs: CBC: Recent Labs  Lab 05/06/21 1741 05/06/21 1806  WBC 7.5  --   NEUTROABS 4.4  --   HGB 13.7 13.9  HCT 40.2 41.0  MCV 92.6  --   PLT 194  --    Basic Metabolic Panel: Recent Labs  Lab 05/06/21 1741 05/06/21 1806  NA 135 138  K 4.2 4.3  CL 103 104  CO2 22  --   GLUCOSE 138* 138*  BUN 29* 34*  CREATININE 1.38* 1.30*  CALCIUM 9.4  --    Liver Function Tests: Recent Labs  Lab 05/06/21 1741  AST 15  ALT 15  ALKPHOS 77  BILITOT 0.7  PROT 6.6  ALBUMIN 3.5   CBG: Recent Labs  Lab 05/06/21 1758  GLUCAP 120*    Discharge time spent: greater than 30 minutes.  Signed: Patrecia Pour, MD [facility] 05/09/2021

## 2021-05-09 NOTE — Procedures (Addendum)
Successful LP from Z9-S8 without complications 10 mL clear CSF obtained for labs. May resume anticoagulation tomorrow.  Ascencion Dike PA-C Interventional Radiology 05/09/2021 11:45 AM

## 2021-05-12 ENCOUNTER — Telehealth: Payer: Self-pay | Admitting: *Deleted

## 2021-05-12 LAB — CSF CULTURE W GRAM STAIN: Culture: NO GROWTH

## 2021-05-14 ENCOUNTER — Telehealth: Payer: Self-pay | Admitting: *Deleted

## 2021-05-14 NOTE — Telephone Encounter (Signed)
° °  Telephone encounter was:  Unsuccessful.  05/14/2021 Name: Britton Bera MRN: [id number] DOB: 31-Oct-1945  Unsuccessful outbound call made today to assist with:   Silver sneakers   Outreach Attempt:  2nd  No voicemail setup  [place], Care Management  [phone] [address] , [place] [address] Email : [email or web address]

## 2021-05-25 ENCOUNTER — Other Ambulatory Visit: Payer: Self-pay | Admitting: Urology

## 2021-05-25 ENCOUNTER — Telehealth: Payer: Self-pay | Admitting: *Deleted

## 2021-05-25 ENCOUNTER — Ambulatory Visit (HOSPITAL_COMMUNITY)
Admission: RE | Admit: 2021-05-25 | Discharge: 2021-05-25 | Disposition: A | Discharge status: Home / Self Care | Payer: Medicare Other | Source: Ambulatory Visit | Attending: Urology | Admitting: Urology

## 2021-05-25 ENCOUNTER — Other Ambulatory Visit: Payer: Self-pay

## 2021-05-25 DIAGNOSIS — N131 Hydronephrosis with ureteral stricture, not elsewhere classified: Secondary | ICD-10-CM | POA: Diagnosis present

## 2021-05-25 NOTE — Progress Notes (Signed)
Opened in error

## 2021-05-25 NOTE — Telephone Encounter (Signed)
° °  Telephone encounter was:  Successful.  05/25/2021 Name: Cristian Henderson MRN: [id number] DOB: 05/27/1945  Cristian Henderson is a 76 y.o. year old male who is a primary care patient of [name], MD . The community resource team was consulted for assistance with  Information on the [facility] in his area   Care guide performed the following interventions: Patient provided with information about care guide support team and interviewed to confirm resource needs Provided Information on the [facility] in his area   Follow Up Plan:  No further follow up planned at this time. The patient has been provided with needed resources.  [place], Care Management  [phone] [address] , [place] [address] Email : [email or web address]

## 2021-05-25 NOTE — Telephone Encounter (Signed)
° °  Telephone encounter was:  Successful.  05/25/2021 Name: Cristian Henderson MRN: [id number] DOB: 1945/12/20  Cristian Henderson is a 76 y.o. year old male who is a primary care patient of [name], MD . The community resource team was consulted for assistance with [facility] guide performed the following interventions: Patient provided with information about care guide support team and interviewed to confirm resource needs.  Follow Up Plan:  No further follow up planned at this time. The patient has been provided with needed resources.  [place], Care Management  [phone] [address] , [place] [address] Email : [email or web address] [email or web address]

## 2021-05-29 ENCOUNTER — Telehealth: Payer: Self-pay | Admitting: *Deleted

## 2021-05-29 NOTE — Telephone Encounter (Signed)
° °  Telephone encounter was:  Successful.  05/29/2021 Name: Cristian Henderson MRN: [id number] DOB: 21-Mar-1946  Cristian Henderson is a 76 y.o. year old male who is a primary care patient of [name], MD . The community resource team was consulted for assistance with  [facility] guide performed the following interventions: Patient provided with information about care guide support team and interviewed to confirm resource needs Follow up call placed to the patient to discuss status of referral.  Follow Up Plan:  No further follow up planned at this time. The patient has been provided with needed resources.  [place], Care Management  [phone] [address] , [place] [address] Email : [email or web address]

## 2021-06-01 ENCOUNTER — Other Ambulatory Visit: Payer: Self-pay

## 2021-06-01 ENCOUNTER — Ambulatory Visit (INDEPENDENT_AMBULATORY_CARE_PROVIDER_SITE_OTHER): Payer: Medicare Other | Admitting: Urology

## 2021-06-01 VITALS — BP 134/71 | HR 85 | Ht 65.0 in | Wt 165.0 lb

## 2021-06-01 DIAGNOSIS — N401 Enlarged prostate with lower urinary tract symptoms: Secondary | ICD-10-CM

## 2021-06-01 DIAGNOSIS — N138 Other obstructive and reflux uropathy: Secondary | ICD-10-CM | POA: Diagnosis not present

## 2021-06-01 DIAGNOSIS — N131 Hydronephrosis with ureteral stricture, not elsewhere classified: Secondary | ICD-10-CM

## 2021-06-01 DIAGNOSIS — R339 Retention of urine, unspecified: Secondary | ICD-10-CM

## 2021-06-01 LAB — URINALYSIS, ROUTINE W REFLEX MICROSCOPIC
Bilirubin, UA: NEGATIVE
Glucose, UA: NEGATIVE
Leukocytes,UA: NEGATIVE
Nitrite, UA: NEGATIVE
Specific Gravity, UA: 1.025 (ref 1.005–1.030)
Urobilinogen, Ur: 2 mg/dL — ABNORMAL HIGH (ref 0.2–1.0)
pH, UA: 5.5 (ref 5.0–7.5)

## 2021-06-01 LAB — MICROSCOPIC EXAMINATION
Bacteria, UA: NONE SEEN
Renal Epithel, UA: NONE SEEN /hpf
WBC, UA: NONE SEEN /hpf (ref 0–5)

## 2021-06-01 MED ORDER — TAMSULOSIN HCL 0.4 MG PO CAPS: 0.4000 mg | ORAL_CAPSULE | Freq: Every day | ORAL | 11 refills | Status: AC

## 2021-06-01 NOTE — Progress Notes (Signed)
06/01/2021 11:41 AM   [name] [birth year] [id number]  Referring provider: [name], MD [address] [place],  [place] [address]  Followup transplant hydronephrosis and BPH   HPI: Mr Cristian Henderson is a 76yo here for followup for transplant hydronephrosis and BPH. Renal US 05/25/2021 shows stable renal pelvis fullness. Creatinine normal. He has stable LUTS on flomax 0.4mg  daily. IPSS 4 QOL 0. No other complaint today   PMH: Past Medical History:  Diagnosis Date   Acid reflux    Anxiety    Arthritis    Atrial fibrillation (HCC)    Depression    Dysrhythmia    AFIB   Gout    High cholesterol    Hypertension    Kidney failure    PTSD (post-traumatic stress disorder)     Surgical History: Past Surgical History:  Procedure Laterality Date   ATRIAL FIBRILLATION ABLATION N/A 10/03/2020   Procedure: ATRIAL FIBRILLATION ABLATION;  Surgeon: Constance Haw, MD;  Location: [facility];  Service: Cardiovascular;  Laterality: N/A;   CYSTOSCOPY  08/07/2020   Procedure: CYSTOSCOPY, INSERTION OF FOLEY CATHETER;  Surgeon: Cleon Gustin, MD;  Location: [facility];  Service: Urology;;   KIDNEY TRANSPLANT Right 2013   REPLACEMENT TOTAL KNEE Right    SKIN CANCER EXCISION     TOTAL SHOULDER REPLACEMENT Left     Home Medications:  Allergies as of 06/01/2021       Reactions   Doxycycline Hyclate Hives   Simvastatin Other (See Comments)   Pt states he loss all use of muscles and he was in the hospital for 10 days    Sirolimus Other (See Comments)   Other reaction(s): Muscle pain (finding)        Medication List        Accurate as of June 01, 2021 11:41 AM. If you have any questions, ask your nurse or doctor.          acetaminophen 500 MG tablet Commonly known as: TYLENOL Take 1,000 mg by mouth every 6 (six) hours as needed for mild pain or moderate pain (Back pain).   allopurinol 100 MG tablet Commonly known as: ZYLOPRIM Take 0.5 tablets (50 mg  total) by mouth daily.   cholecalciferol 25 MCG (1000 UNIT) tablet Commonly known as: VITAMIN D3 Take 1,000 Units by mouth in the morning and at bedtime.   cycloSPORINE modified 25 MG capsule Commonly known as: NEORAL Take 75 mg by mouth 2 (two) times daily.   diltiazem 240 MG 24 hr capsule Commonly known as: DILACOR XR Take 240 mg by mouth daily.   escitalopram 10 MG tablet Commonly known as: Lexapro Take 1 tablet (10 mg total) by mouth daily.   ezetimibe 10 MG tablet Commonly known as: ZETIA Take 1 tablet (10 mg total) by mouth daily.   ferrous sulfate 325 (65 FE) MG EC tablet Take 1 tablet by mouth 2 (two) times daily.   Fish Oil 1200 MG Caps Take 2,400 mg by mouth in the morning and at bedtime.   mycophenolate 180 MG EC tablet Commonly known as: MYFORTIC Take 180 mg by mouth 2 (two) times daily.   omeprazole 40 MG capsule Commonly known as: PRILOSEC Take 40 mg by mouth daily.   predniSONE 20 MG tablet Commonly known as: DELTASONE Take 1 tablet (20 mg total) by mouth daily with breakfast.   Rivaroxaban 15 MG Tabs tablet Commonly known as: XARELTO Take 15 mg by mouth daily with breakfast.  sodium bicarbonate 650 MG tablet Take 650 mg by mouth 2 (two) times daily.   tamsulosin 0.4 MG Caps capsule Commonly known as: FLOMAX Take 1 capsule (0.4 mg total) by mouth daily.        Allergies:  Allergies  Allergen Reactions   Doxycycline Hyclate Hives   Simvastatin Other (See Comments)    Pt states he loss all use of muscles and he was in the hospital for 10 days    Sirolimus Other (See Comments)    Other reaction(s): Muscle pain (finding)    Family History: Family History  Problem Relation Age of Onset   Alzheimer's disease Mother    Diabetes Mother     Social History:  reports that he has never smoked. He has been exposed to tobacco smoke. He has never used smokeless tobacco. He reports that he does not currently use alcohol. He reports that he does  not use drugs.  ROS: All other review of systems were reviewed and are negative except what is noted above in HPI  Physical Exam: BP 134/71    Pulse 85    Ht 5\' 5"  (1.651 m)    Wt 165 lb (74.8 kg)    BMI 27.46 kg/m   Constitutional:  Alert and oriented, No acute distress. HEENT: [place] AT, moist mucus membranes.  Trachea midline, no masses. Cardiovascular: No clubbing, cyanosis, or edema. Respiratory: Normal respiratory effort, no increased work of breathing. GI: Abdomen is soft, nontender, nondistended, no abdominal masses GU: No CVA tenderness.  Lymph: No cervical or inguinal lymphadenopathy. Skin: No rashes, bruises or suspicious lesions. Neurologic: Grossly intact, no focal deficits, moving all 4 extremities. Psychiatric: Normal mood and affect.  Laboratory Data: Lab Results  Component Value Date   WBC 7.5 05/06/2021   HGB 13.9 05/06/2021   HCT 41.0 05/06/2021   MCV 92.6 05/06/2021   PLT 194 05/06/2021    Lab Results  Component Value Date   CREATININE 1.30 (H) 05/06/2021    No results found for: PSA  No results found for: TESTOSTERONE  No results found for: HGBA1C  Urinalysis    Component Value Date/Time   COLORURINE YELLOW 05/06/2021 [place] 05/06/2021 2238   APPEARANCEUR Clear 08/20/2020 1327   LABSPEC 1.010 05/06/2021 2238   PHURINE 7.0 05/06/2021 2238   GLUCOSEU NEGATIVE 05/06/2021 2238   HGBUR TRACE (A) 05/06/2021 2238   BILIRUBINUR NEGATIVE 05/06/2021 2238   BILIRUBINUR Negative 08/20/2020 [place] 05/06/2021 2238   [place] 05/06/2021 2238   NITRITE NEGATIVE 05/06/2021 2238   LEUKOCYTESUR NEGATIVE 05/06/2021 2238    Lab Results  Component Value Date   LABMICR See below: 08/20/2020   WBCUA 0-5 08/20/2020   LABEPIT None seen 08/20/2020   MUCUS Present 07/09/2020   BACTERIA NONE SEEN 05/06/2021    Pertinent Imaging: Renal US 05/25/2021: Images reviewed and discussed with the patient  No results found  for this or any previous visit.  No results found for this or any previous visit.  No results found for this or any previous visit.  No results found for this or any previous visit.  Results for orders placed during the hospital encounter of 08/06/20  US RENAL  Narrative CLINICAL DATA:  Hydronephrosis of transplant kidney. Ureteral orifice could not be cannulated for retrograde stent placement cystoscopically.  EXAM: RENAL / URINARY TRACT ULTRASOUND COMPLETE  COMPARISON:  07/28/2020 CT  FINDINGS: Transplant kidney:  Location: Right iliac fossa. Renal measurements: 11.5 x 7.8 x  5.4 = volume: 254 mL. Echogenicity within normal limits. No mass visualized. There is moderate hydronephrosis.  Right Kidney:  Renal measurements: 9 x 4.8 x 4.4 = volume: 97 mL. Echogenic renal parenchyma. No hydronephrosis. 2 subcentimeter cystic appearing lesions in the lower pole.  Left Kidney:  Renal measurements: 9.7 x 7.3 x 6.6 = volume: 244 mL. Echogenic parenchyma. No hydronephrosis. 6.4 cm cystic lesion in the lower pole.  Bladder:  Decompressed by Foley catheter.  Other:  None.  IMPRESSION: 1. Moderate hydronephrosis of the transplant kidney in the right iliac fossa. 2. Bilateral native renal atrophy with cystic lesions, no hydronephrosis.   Electronically Signed By: Lucrezia Europe M.D. On: 08/08/2020 09:51  No results found for this or any previous visit.  No results found for this or any previous visit.  Results for orders placed during the hospital encounter of 07/28/20  CT Renal Stone Study  Narrative CLINICAL DATA:  Flank pain, kidney stone suspected  Patient reports he is unable to void. Renal transplant 2013. Right lower quadrant and left upper abdominal pain today.  EXAM: CT ABDOMEN AND PELVIS WITHOUT CONTRAST  TECHNIQUE: Multidetector CT imaging of the abdomen and pelvis was performed following the standard protocol without IV contrast.  COMPARISON:   Renal transplant ultrasound 11/26/2020  FINDINGS: Lower chest: Upper normal heart size. There are coronary artery calcifications. No pleural fluid or acute airspace disease.  Hepatobiliary: Borderline hepatic steatosis. No discrete focal hepatic lesion on noncontrast exam. Decompressed gallbladder. No calcified gallstone. No biliary dilatation.  Pancreas: No ductal dilatation or inflammation.  Spleen: Normal in size without focal abnormality.  Adrenals/Urinary Tract: Normal adrenal glands. Marked bilateral native renal atrophy. Low-density lesions arising from both native kidneys measure or simple fluid density and are consistent with cysts. Largest arises from the posterior mid left kidney measures 6.5 cm. No hydronephrosis or ureteral dilatation.  Right lower quadrant renal transplant. Mild transplant hydronephrosis but no ureteral dilatation. Minor transplant perinephric edema. No transplant calculi. Possible cyst in the lower pole of the renal transplant, but not well assessed on this noncontrast exam. The urinary bladder is partially distended, no bladder wall thickening or stone.  Stomach/Bowel: Unremarkable stomach. Small bowel primarily decompressed. No obstruction or inflammation. Normal appendix. Moderate colonic stool burden. No colonic wall thickening or inflammation. Occasional left colonic diverticula without diverticulitis.  Vascular/Lymphatic: Moderate aortic and branch atherosclerosis. No aortic aneurysm no abdominopelvic adenopathy.  Reproductive: Enlarged spanning 5.5 cm transverse.  Other: Postsurgical change of the anterior abdominal wall. Tiny fat containing umbilical hernia. Minimal fat in both inguinal canals. No ascites, free air, or abdominopelvic fluid collection.  Musculoskeletal: Degenerative disc disease at L4-L5 and L5-S1. There are no acute or suspicious osseous abnormalities.  IMPRESSION: 1. Right lower quadrant renal transplant. Mild  transplant hydronephrosis but no urolithiasis or cause for obstruction. Minor transplant perinephric edema. The degree of hydronephrosis appears improved from ultrasound earlier this year. 2. Marked bilateral native renal atrophy with bilateral renal cysts. 3. Enlarged prostate spanning 5.5 cm.  Aortic Atherosclerosis (ICD10-I70.0).   Electronically Signed By: Keith Rake M.D. On: 07/28/2020 15:58   Assessment & Plan:    1. Hydronephrosis with ureteral stricture, not elsewhere classified -RTC 6 months with renal US - Urinalysis, Routine w reflex microscopic  2. Benign prostatic hyperplasia with urinary obstruction -Continue flomax 0.4mg  daily  3. Incomplete emptying of bladder -Continue flomax 0.4mg  daily   No follow-ups on file.  Nicolette Bang, MD  [facility] [place]

## 2021-06-06 ENCOUNTER — Encounter: Payer: Self-pay | Admitting: Urology

## 2021-06-06 NOTE — Patient Instructions (Signed)
Hydronephrosis ?Hydronephrosis is the swelling of one or both kidneys due to a blockage that stops urine from flowing out of the body. Kidneys filter waste from the blood and produce urine. This condition can lead to kidney failure and may become life-threatening if not treated promptly. ?What are the causes? ?In infants and children, common causes include problems that occur when a baby is developing in the womb. These can include problems in the kidneys or in the tubes that drain urine into the bladder (ureters). ?In adults, common causes include: ?Kidney stones. ?Pregnancy. ?A tumor or cyst in the abdomen or pelvis. ?An enlarged prostate gland. ?Other causes include: ?Bladder infection. ?Scar tissue from a previous surgery or injury. ?A blood clot. ?Cancer of the prostate, bladder, uterus, ovary, or colon. ?What are the signs or symptoms? ?Symptoms of this condition include: ?Pain or discomfort in your side (flank) or abdomen. ?Swelling in your abdomen. ?Nausea and vomiting. ?Fever. ?Pain when passing urine. ?Feelings of urgency when you need to urinate. ?Urinating more often than normal. ?In some cases, you may not have any symptoms. ?How is this diagnosed? ?This condition may be diagnosed based on: ?Your symptoms and medical history. ?A physical exam. ?Blood and urine tests. ?Imaging tests, such as an ultrasound, CT scan, or MRI. ?A procedure to look at your urinary tract and bladder by inserting a scope into the urethra (cystoscopy). ?How is this treated? ?Treatment for this condition depends on where the blockage is, how long it has been there, and what caused it. The goal of treatment is to remove the blockage. Treatment may include: ?Antibiotic medicines to treat or prevent infection. ?A procedure to place a small, thin tube (stent) into a blocked ureter. The stent will keep the ureter open so that urine can drain through it. ?A nonsurgical procedure that crushes kidney stones with shock waves  (extracorporeal shock wave lithotripsy). ?If kidney failure occurs, treatment may include dialysis or a kidney transplant. ?Follow these instructions at home: ? ?Take over-the-counter and prescription medicines only as told by your health care provider. ?If you were prescribed an antibiotic medicine, take it exactly as told by your health care provider. Do not stop taking the antibiotic even if you start to feel better. ?Rest and return to your normal activities as told by your health care provider. Ask your health care provider what activities are safe for you. ?Drink enough fluid to keep your urine pale yellow. ?Keep all follow-up visits. This is important. ?Contact a health care provider if: ?You continue to have symptoms after treatment. ?You develop new symptoms. ?Your urine becomes cloudy or bloody. ?You have a fever. ?Get help right away if: ?You have severe flank or abdominal pain. ?You cannot drink fluids without vomiting. ?Summary ?Hydronephrosis is the swelling of one or both kidneys due to a blockage that stops urine from flowing out of the body. ?Hydronephrosis can lead to kidney failure and may become life-threatening if not treated promptly. ?The goal of treatment is to remove the blockage. It may include a procedure to insert a stent into a blocked ureter, a procedure to break up kidney stones, or taking antibiotic medicines. ?Follow your health care provider's instructions for taking care of yourself at home, including instructions about drinking fluids, taking medicines, and limiting activities. ?This information is not intended to replace advice given to you by your health care provider. Make sure you discuss any questions you have with your health care provider. ?Document Revised: 07/10/2019 Document Reviewed: 07/10/2019 ?Elsevier Patient   Education ? 2022 [employer]vier Inc[employer] ? ?

## 2021-06-11 ENCOUNTER — Other Ambulatory Visit: Payer: Self-pay

## 2021-06-11 ENCOUNTER — Other Ambulatory Visit (INDEPENDENT_AMBULATORY_CARE_PROVIDER_SITE_OTHER): Payer: Self-pay

## 2021-06-11 ENCOUNTER — Encounter (INDEPENDENT_AMBULATORY_CARE_PROVIDER_SITE_OTHER): Payer: Self-pay

## 2021-06-11 ENCOUNTER — Telehealth (INDEPENDENT_AMBULATORY_CARE_PROVIDER_SITE_OTHER): Payer: Self-pay

## 2021-06-11 ENCOUNTER — Ambulatory Visit (INDEPENDENT_AMBULATORY_CARE_PROVIDER_SITE_OTHER): Payer: Medicare Other | Admitting: Gastroenterology

## 2021-06-11 ENCOUNTER — Encounter (INDEPENDENT_AMBULATORY_CARE_PROVIDER_SITE_OTHER): Payer: Self-pay | Admitting: Gastroenterology

## 2021-06-11 DIAGNOSIS — D509 Iron deficiency anemia, unspecified: Secondary | ICD-10-CM

## 2021-06-11 MED ORDER — PEG 3350-KCL-NA BICARB-NACL 420 G PO SOLR: 4000.0000 mL | ORAL | 0 refills | Status: DC

## 2021-06-11 NOTE — Telephone Encounter (Signed)
Sinclaire Artiga Ann Vidal Lampkins, CMA  ?

## 2021-06-11 NOTE — Progress Notes (Signed)
Cristian Henderson, M.D. Gastroenterology & Hepatology [facility] [address] [place], [place] [address] Primary Care Physician: [name], MD [address] [place] [address]  Referring MD: Ulice Bold, MD  Chief Complaint: Iron deficiency anemia.  History of Present Illness: Cristian Henderson is a 76 y.o. 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  Patient was referred from the nephrology office for evaluation of iron deficiency anemia.  Labs sent from nephrologist office showed presence of decreased iron saturation on labs performed 01/16/2021 (previous labs were normal).  His iron was 57 with a low ferritin of 22 and iron saturation of 15%.  Hemoglobin at that time was 11.0.  Creatinine was 1.49 BUN 29.  He was started on oral iron supplementation and his repeat iron stores from 01/29/2021 showed a total iron of 90 and ferritin of 89, iron saturation of 23%.  Patient was given oral iron supplementation but he stopped taking it as he was having discomfort in his abdomen while taking this medicine - he was taking ferrous sulfate twice a day for a month.  States the abdominal pain resolved after he stopped the iron PO.  He denies having any complaints and states feeling fine.  The patient denies having any nausea, vomiting, fever, chills, hematochezia, melena, hematemesis, abdominal distention,diarrhea, jaundice, pruritus or weight loss.  Patient is on Xarelto for history of atrial fibrillation.  Last UJW:JXBJY Last Colonoscopy:2019 - had it performed in 2019 by Dr. Sandrea Hammond (family doctor) at [facility] No report available but patient states it was normal.  FHx: neg for any gastrointestinal/liver  disease, no malignancies Social: neg smoking, alcohol or illicit drug use Surgical: no abdominal surgeries  Past Medical History: Past Medical History:  Diagnosis Date   Acid reflux    Anxiety    Arthritis    Atrial fibrillation (HCC)    Depression    Dysrhythmia    AFIB   Gout    High cholesterol    Hypertension    Kidney failure    PTSD (post-traumatic stress disorder)     Past Surgical History: Past Surgical History:  Procedure Laterality Date   ATRIAL FIBRILLATION ABLATION N/A 10/03/2020   Procedure: ATRIAL FIBRILLATION ABLATION;  Surgeon: Constance Haw, MD;  Location: [facility];  Service: Cardiovascular;  Laterality: N/A;   CYSTOSCOPY  08/07/2020   Procedure: CYSTOSCOPY, INSERTION OF FOLEY CATHETER;  Surgeon: Cleon Gustin, MD;  Location: [facility];  Service: Urology;;   KIDNEY TRANSPLANT Right 2013   REPLACEMENT TOTAL KNEE Right    SKIN CANCER EXCISION     TOTAL SHOULDER REPLACEMENT Left     Family History: Family History  Problem Relation Age of Onset   Alzheimer's disease Mother    Diabetes Mother     Social History: Social History   Tobacco Use  Smoking Status Never   Passive exposure: Past  Smokeless Tobacco Never   Social History   Substance and Sexual Activity  Alcohol Use Not Currently   Social History   Substance and Sexual Activity  Drug Use Never    Allergies: Allergies  Allergen Reactions   Doxycycline Hyclate Hives   Simvastatin Other (See Comments)    Pt states he loss all use of muscles and he was in the hospital for  10 days    Sirolimus Other (See Comments)    Other reaction(s): Muscle pain (finding)    Medications: Current Outpatient Medications  Medication Sig Dispense Refill   acetaminophen (TYLENOL) 500 MG tablet Take 1,000 mg by mouth every 6 (six) hours as needed for mild pain or moderate pain (Back pain).     allopurinol (ZYLOPRIM) 100 MG tablet Take 0.5 tablets (50 mg total) by mouth daily. 30 tablet  11   cholecalciferol (VITAMIN D3) 25 MCG (1000 UNIT) tablet Take 1,000 Units by mouth in the morning and at bedtime.     cycloSPORINE modified (NEORAL) 25 MG capsule Take 75 mg by mouth 2 (two) times daily.     diltiazem (DILACOR XR) 240 MG 24 hr capsule Take 240 mg by mouth daily.     escitalopram (LEXAPRO) 10 MG tablet Take 1 tablet (10 mg total) by mouth daily. 30 tablet 3   ezetimibe (ZETIA) 10 MG tablet Take 1 tablet (10 mg total) by mouth daily. 90 tablet 1   mycophenolate (MYFORTIC) 180 MG EC tablet Take 180 mg by mouth 2 (two) times daily.     Omega-3 Fatty Acids (FISH OIL) 1200 MG CAPS Take 2,400 mg by mouth in the morning and at bedtime.     omeprazole (PRILOSEC) 40 MG capsule Take 40 mg by mouth daily.     predniSONE (DELTASONE) 20 MG tablet Take 1 tablet (20 mg total) by mouth daily with breakfast. 5 tablet 0   Rivaroxaban (XARELTO) 15 MG TABS tablet Take 15 mg by mouth daily with breakfast.     sodium bicarbonate 650 MG tablet Take 650 mg by mouth 2 (two) times daily.     tamsulosin (FLOMAX) 0.4 MG CAPS capsule Take 1 capsule (0.4 mg total) by mouth daily. 30 capsule 11   ferrous sulfate 325 (65 FE) MG EC tablet Take 1 tablet by mouth 2 (two) times daily. (Patient not taking: Reported on 06/11/2021)     No current facility-administered medications for this visit.    Review of Systems: GENERAL: negative for malaise, night sweats HEENT: No changes in hearing or vision, no nose bleeds or other nasal problems. NECK: Negative for lumps, goiter, pain and significant neck swelling RESPIRATORY: Negative for cough, wheezing CARDIOVASCULAR: Negative for chest pain, leg swelling, palpitations, orthopnea GI: SEE HPI MUSCULOSKELETAL: Negative for joint pain or swelling, back pain, and muscle pain. SKIN: Negative for lesions, rash PSYCH: Negative for sleep disturbance, mood disorder and recent psychosocial stressors. HEMATOLOGY Negative for prolonged bleeding, bruising easily, and swollen  nodes. ENDOCRINE: Negative for cold or heat intolerance, polyuria, polydipsia and goiter. NEURO: negative for tremor, gait imbalance, syncope and seizures. The remainder of the review of systems is noncontributory.   Physical Exam: BP (!) 144/70 (BP Location: Left Arm, Patient Position: Sitting, Cuff Size: Large)    Pulse 67    Temp 97.7 F (36.5 C) (Oral)    Ht '5\' 5"'$  (1.651 m)    Wt 166 lb 11.2 oz (75.6 kg)    BMI 27.74 kg/m  GENERAL: The patient is AO x3, in no acute distress. HEENT: Head is normocephalic and atraumatic. EOMI are intact. Mouth is well hydrated and without lesions. NECK: Supple. No masses LUNGS: Clear to auscultation. No presence of rhonchi/wheezing/rales. Adequate chest expansion HEART: RRR, normal s1 and s2. ABDOMEN: Soft, nontender, no guarding, no peritoneal signs, and nondistended. BS +.  Transplanted kidney is palpable in the right lower quadrant, there is presence of a scar in this area  as well. EXTREMITIES: Without any cyanosis, clubbing, rash, lesions or edema. NEUROLOGIC: AOx3, no focal motor deficit. SKIN: no jaundice, no rashes   Imaging/Labs: as above  I personally reviewed and interpreted the available labs, imaging and endoscopic files.  Impression and Plan: Hever Castilleja is a 76 y.o. 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  The patient had presence of mild normocytic anemia with presence of mildly decreased iron stores, which actually improved with transient oral iron intake.  He has not presented any overt gastrointestinal bleeding but given the fact he has presented new onset anemia due to possible iron losses in the setting of systemic anticoagulation, we will need to investigate this further with an EGD and a  colonoscopy.  As he was not able to tolerate ferrous sulfate 225 mg twice a day, we will try Flintstone iron multivitamins for now.  If unable to tolerate, he may require to have IV iron infusion in the future.  - Schedule EGD and colonoscopy - Start Flintstone iron multivitamins daily  - will need to obtain clearance from cardiologist to stop Xarelto 3 days before the procedure  All questions were answered.      Cristian Peppers, MD Gastroenterology and Hepatology [facility]

## 2021-06-11 NOTE — Patient Instructions (Addendum)
Schedule EGD and colonoscopy ?Start Flintstone iron multivitamins daily  ? ?

## 2021-06-15 ENCOUNTER — Encounter (INDEPENDENT_AMBULATORY_CARE_PROVIDER_SITE_OTHER): Payer: Self-pay

## 2021-06-15 ENCOUNTER — Telehealth (INDEPENDENT_AMBULATORY_CARE_PROVIDER_SITE_OTHER): Payer: Self-pay

## 2021-06-15 MED ORDER — POLYETHYLENE GLYCOL 3350 17 GM/SCOOP PO POWD: 1.0000 | Freq: Every day | ORAL | 3 refills | Status: DC

## 2021-06-15 NOTE — Telephone Encounter (Signed)
Yanissa Michalsky Ann Nakkia Mackiewicz, CMA  ?

## 2021-06-22 ENCOUNTER — Other Ambulatory Visit (INDEPENDENT_AMBULATORY_CARE_PROVIDER_SITE_OTHER): Payer: Self-pay

## 2021-06-22 ENCOUNTER — Encounter (INDEPENDENT_AMBULATORY_CARE_PROVIDER_SITE_OTHER): Payer: Self-pay

## 2021-06-25 ENCOUNTER — Ambulatory Visit (INDEPENDENT_AMBULATORY_CARE_PROVIDER_SITE_OTHER): Payer: Medicare Other | Admitting: Neurology

## 2021-06-25 VITALS — BP 145/72 | HR 74 | Ht 65.0 in | Wt 165.0 lb

## 2021-06-25 DIAGNOSIS — G43709 Chronic migraine without aura, not intractable, without status migrainosus: Secondary | ICD-10-CM

## 2021-06-25 NOTE — Patient Instructions (Signed)
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  His headache frequency is not enough to justify prophylactic medications at the present time.  I advised him to pay attention to triggers and try to avoid them.  He was advised to take over-the-counter nonsteroidal anti-inflammatory medicines at the earliest onset of his headache or visual aura.  I also reviewed MRI images with him and showed him that I do not think he has a cerebellar stroke and in fact that there is likely a CSF fluid collection which has been called a stroke.  He will return for follow-up in the future only if needed and no schedule appointment was made. ? ?Migraine Headache ?A migraine headache is an intense, throbbing pain on one side or both sides of the head. Migraine headaches may also cause other symptoms, such as nausea, vomiting, and sensitivity to light and noise. A migraine headache can last from 4 hours to 3 days. Talk with your doctor about what things may bring on (trigger) your migraine headaches. ?What are the causes? ?The exact cause of this condition is not known. However, a migraine may be caused when nerves in the brain become irritated and release chemicals that cause inflammation of blood vessels. This inflammation causes pain. This condition may be triggered or caused by: ?Drinking alcohol. ?Smoking. ?Taking medicines, such as: ?Medicine used to treat chest pain (nitroglycerin). ?Birth control pills. ?Estrogen. ?Certain blood pressure medicines. ?Eating or drinking products that contain nitrates, glutamate, aspartame, or tyramine. Aged cheeses, chocolate, or caffeine may also be triggers. ?Doing physical activity. ?Other things that may trigger a migraine headache include: ?Menstruation. ?Pregnancy. ?Hunger. ?Stress. ?Lack of sleep or too much sleep. ?Weather  changes. ?Fatigue. ?What increases the risk? ?The following factors may make you more likely to experience migraine headaches: ?Being a certain age. This condition is more common in people who are 48-74 years old. ?Being male. ?Having a family history of migraine headaches. ?Being Caucasian. ?Having a mental health condition, such as depression or anxiety. ?Being obese. ?What are the signs or symptoms? ?The main symptom of this condition is pulsating or throbbing pain. This pain may: ?Happen in any area of the head, such as on one side or both sides. ?Interfere with daily activities. ?Get worse with physical activity. ?Get worse with exposure to bright lights or loud noises. ?Other symptoms may include: ?Nausea. ?Vomiting. ?Dizziness. ?General sensitivity to bright lights, loud noises, or smells. ?Before you get a migraine headache, you may get warning signs (an aura). An aura may include: ?Seeing flashing lights or having blind spots. ?Seeing bright spots, halos, or zigzag lines. ?Having tunnel vision or blurred vision. ?Having numbness or a tingling feeling. ?Having trouble talking. ?Having muscle weakness. ?Some people have symptoms after a migraine headache (postdromal phase), such as: ?Feeling tired. ?Difficulty concentrating. ?How is this diagnosed? ?A migraine headache can be diagnosed based on: ?Your symptoms. ?A physical exam. ?Tests, such as: ?CT scan or an MRI of the head. These imaging tests can help rule out other causes of headaches. ?Taking fluid from the spine (lumbar puncture) and analyzing it (cerebrospinal fluid analysis, or CSF analysis). ?How is this treated? ?This condition may be treated with medicines that: ?Relieve pain. ?Relieve nausea. ?Prevent migraine headaches. ?Treatment for this condition may also include: ?Acupuncture. ?Lifestyle changes like avoiding foods that trigger migraine headaches. ?Biofeedback. ?Cognitive  behavioral therapy. ?Follow these instructions at  home: ?Medicines ?Take over-the-counter and prescription medicines only as told by your health care provider. ?Ask your health care provider if the medicine prescribed to you: ?Requires you to avoid driving or using heavy machinery. ?Can cause constipation. You may need to take these actions to prevent or treat constipation: ?Drink enough fluid to keep your urine pale yellow. ?Take over-the-counter or prescription medicines. ?Eat foods that are high in fiber, such as beans, whole grains, and fresh fruits and vegetables. ?Limit foods that are high in fat and processed sugars, such as fried or sweet foods. ?Lifestyle ?Do not drink alcohol. ?Do not use any products that contain nicotine or tobacco, such as cigarettes, e-cigarettes, and chewing tobacco. If you need help quitting, ask your health care provider. ?Get at least 8 hours of sleep every night. ?Find ways to manage stress, such as meditation, deep breathing, or yoga. ?General instructions ?  ?Keep a journal to find out what may trigger your migraine headaches. For example, write down: ?What you eat and drink. ?How much sleep you get. ?Any change to your diet or medicines. ?If you have a migraine headache: ?Avoid things that make your symptoms worse, such as bright lights. ?It may help to lie down in a dark, quiet room. ?Do not drive or use heavy machinery. ?Ask your health care provider what activities are safe for you while you are experiencing symptoms. ?Keep all follow-up visits as told by your health care provider. This is important. ?Contact a health care provider if: ?You develop symptoms that are different or more severe than your usual migraine headache symptoms. ?You have more than 15 headache days in one month. ?Get help right away if: ?Your migraine headache becomes severe. ?Your migraine headache lasts longer than 72 hours. ?You have a fever. ?You have a stiff neck. ?You have vision loss. ?Your muscles feel weak or like you cannot control them. ?You  start to lose your balance often. ?You have trouble walking. ?You faint. ?You have a seizure. ?Summary ?A migraine headache is an intense, throbbing pain on one side or both sides of the head. Migraines may also cause other symptoms, such as nausea, vomiting, and sensitivity to light and noise. ?This condition may be treated with medicines and lifestyle changes. You may also need to avoid certain things that trigger a migraine headache. ?Keep a journal to find out what may trigger your migraine headaches. ?Contact your health care provider if you have more than 15 headache days in a month or you develop symptoms that are different or more severe than your usual migraine headache symptoms. ?This information is not intended to replace advice given to you by your health care provider. Make sure you discuss any questions you have with your health care provider. ?Document Revised: 07/14/2018 Document Reviewed: 05/04/2018 ?Elsevier Patient Education ? [place]. ? ?

## 2021-06-25 NOTE — Progress Notes (Signed)
?[facility] ?[address] ?[place] [address] ?[phone] ? ?     OFFICE CONSULT NOTE ? ?Mr. Cristian Henderson ?Date of Birth:  [birth year] ?Medical Record Number:  [id number]  ? ?Referring MD: Vance Gather ? ?Reason for Referral: TIA versus migraine ? ?HPI: Mr. Bielinski is a 76 year old Caucasian male seen today for initial office consultation visit for TIA versus migraine episode.  History is obtained from the patient and review of electronic medical records and opossum reviewed pertinent available imaging films in PACS.  He has past medical history of hypertension, hyperlipidemia, depression, gout, gastroesophageal flux disease and chronic A-fib on long-term anticoagulation with Xarelto who presented on 05/07/2021 upon awakening from sleep with a headache.  Initial history obtained by the neuro hospitalist suggested worst headache of his life and persistent severe headaches of their concern for possible subarachnoid hemorrhage and CT head was obtained which was unremarkable and MRI scan of the brain was also obtained which showed no acute abnormalities.  Due to concerns for possible subarachnoid hemorrhage Xarelto was stopped and eventually underwent a spinal tap which showed only 1 red cells and 6 white cells with normal glucose and protein.The patient gave a slightly different history to the neurologist and stated that headache on waking at 6 AM was not maximum but gradually increased to maximum in 3 to 4 hours.  There was associated photophobia and nausea and blurred vision.  The patient actually reported seeing gray wavy lines at the periphery of bilateral visual fields at the onset of his headache.  He reported a similar headache in January 2023 but not as intense.  He confirmed history of migraines in his mother and his daughter.  On inquiring to me also his admitted to several previous episodes of minor headaches with some visual disturbance.  He was never formally diagnosed with  migraine.  He denies any history of stroke, TIA, seizure, loss of consciousness.  He has had a few minor headaches off and on for the last few months and he does not even usually take over-the-counter medications for these.  He has no new complaints today. ? ?ROS:   ?14 system review of systems is positive for headache, blurred vision, confusion all other systems negative ? ?PMH:  ?Past Medical History:  ?Diagnosis Date  ? Acid reflux   ? Anxiety   ? Arthritis   ? Atrial fibrillation ([place])   ? Depression   ? Dysrhythmia   ? AFIB  ? Gout   ? High cholesterol   ? Hypertension   ? Kidney failure   ? PTSD (post-traumatic stress disorder)   ? ? ?Social History:  ?Social History  ? ?Socioeconomic History  ? Marital status: Married  ?  Spouse name: Lovey Newcomer  ? Number of children: 3  ? Years of education: 4  ? Highest education level: Associate degree: academic program  ?Occupational History  ? Occupation: retired  ?Tobacco Use  ? Smoking status: Never  ?  Passive exposure: Past  ? Smokeless tobacco: Never  ?Vaping Use  ? Vaping Use: Never used  ?Substance and Sexual Activity  ? Alcohol use: Not Currently  ? Drug use: Never  ? Sexual activity: Yes  ?Other Topics Concern  ? Not on file  ?Social History Narrative  ? Not on file  ? ?Social Determinants of Health  ? ?Financial Resource Strain: Low Risk   ? Difficulty of Paying Living Expenses: Not hard at all  ?Food Insecurity: No Food Insecurity  ? Worried  About Running Out of Food in the Last Year: Never true  ? Ran Out of Food in the Last Year: Never true  ?Transportation Needs: No Transportation Needs  ? Lack of Transportation (Medical): No  ? Lack of Transportation (Non-Medical): No  ?Physical Activity: Unknown  ? Days of Exercise per Week: 3 days  ? Minutes of Exercise per Session: Not on file  ?Stress: Not on file  ?Social Connections: Moderately Integrated  ? Frequency of Communication with Friends and Family: More than three times a week  ? Frequency of Social Gatherings  with Friends and Family: Twice a week  ? Attends Religious Services: More than 4 times per year  ? Active Member of Clubs or Organizations: No  ? Attends Archivist Meetings: Never  ? Marital Status: Married  ?Intimate Partner Violence: Not At Risk  ? Fear of Current or Ex-Partner: No  ? Emotionally Abused: No  ? Physically Abused: No  ? Sexually Abused: No  ? ? ?Medications:   ?Current Outpatient Medications on File Prior to Visit  ?Medication Sig Dispense Refill  ? acetaminophen (TYLENOL) 500 MG tablet Take 1,000 mg by mouth every 6 (six) hours as needed for mild pain or moderate pain (Back pain).    ? allopurinol (ZYLOPRIM) 100 MG tablet Take 0.5 tablets (50 mg total) by mouth daily. 30 tablet 11  ? cholecalciferol (VITAMIN D3) 25 MCG (1000 UNIT) tablet Take 1,000 Units by mouth in the morning and at bedtime.    ? cycloSPORINE modified (NEORAL) 25 MG capsule Take 75 mg by mouth 2 (two) times daily.    ? diltiazem (DILACOR XR) 240 MG 24 hr capsule Take 240 mg by mouth daily.    ? escitalopram (LEXAPRO) 10 MG tablet Take 1 tablet (10 mg total) by mouth daily. 30 tablet 3  ? ezetimibe (ZETIA) 10 MG tablet Take 1 tablet (10 mg total) by mouth daily. 90 tablet 1  ? ferrous sulfate 325 (65 FE) MG EC tablet Take 1 tablet by mouth 2 (two) times daily.    ? mycophenolate (MYFORTIC) 180 MG EC tablet Take 180 mg by mouth 2 (two) times daily.    ? Omega-3 Fatty Acids (FISH OIL) 1200 MG CAPS Take 2,400 mg by mouth in the morning and at bedtime.    ? omeprazole (PRILOSEC) 40 MG capsule Take 40 mg by mouth daily.    ? polyethylene glycol powder (GLYCOLAX/MIRALAX) 17 GM/SCOOP powder Take 255 g by mouth daily. 255 g 3  ? predniSONE (DELTASONE) 20 MG tablet Take 1 tablet (20 mg total) by mouth daily with breakfast. 5 tablet 0  ? Rivaroxaban (XARELTO) 15 MG TABS tablet Take 15 mg by mouth daily with breakfast.    ? sodium bicarbonate 650 MG tablet Take 650 mg by mouth 2 (two) times daily.    ? tamsulosin (FLOMAX) 0.4 MG  CAPS capsule Take 1 capsule (0.4 mg total) by mouth daily. 30 capsule 11  ? ?No current facility-administered medications on file prior to visit.  ? ? ?Allergies:   ?Allergies  ?Allergen Reactions  ? Doxycycline Hyclate Hives  ? Simvastatin Other (See Comments)  ?  Pt states he loss all use of muscles and he was in the hospital for 10 days   ? Sirolimus Other (See Comments)  ?  Other reaction(s): Muscle pain (finding)  ? ? ?Physical Exam ?General: well developed, well nourished pleasant elderly Caucasian male, seated, in no evident distress ?Head: head normocephalic and atraumatic.   ?Neck:  supple with no carotid or supraclavicular bruits ?Cardiovascular: regular rate and rhythm, no murmurs ?Musculoskeletal: no deformity ?Skin:  no rash/petichiae ?Vascular:  Normal pulses all extremities ? ?Neurologic Exam ?Mental Status: Awake and fully alert. Oriented to place and time. Recent and remote memory intact. Attention span, concentration and fund of knowledge appropriate. Mood and affect appropriate.  ?Cranial Nerves: Fundoscopic exam reveals sharp disc margins. Pupils equal, briskly reactive to light. Extraocular movements full without nystagmus. Visual fields full to confrontation. Hearing intact. Facial sensation intact. Face, tongue, palate moves normally and symmetrically.  ?Motor: Normal bulk and tone. Normal strength in all tested extremity muscles. ?Sensory.: intact to touch , pinprick , position and vibratory sensation.  ?Coordination: Rapid alternating movements normal in all extremities. Finger-to-nose and heel-to-shin performed accurately bilaterally. ?Gait and Station: Arises from chair without difficulty. Stance is normal. Gait demonstrates normal stride length and balance . Able to heel, toe and tandem walk without difficulty.  ?Reflexes: 1+ and symmetric. Toes downgoing.  ?  ? ? ?ASSESSMENT: 76 year old Caucasian male with transient episode of headache with vision disturbance likely complicated migraine  episode rather than a TIA since he had had somewhat similar episodes in the past ? ? ? ? ?PLAN:I had a long discussion with the patient and his wife regarding his episode of headache with visual disturbance

## 2021-07-15 ENCOUNTER — Other Ambulatory Visit (HOSPITAL_COMMUNITY): Payer: Medicare Other

## 2021-07-21 NOTE — Patient Instructions (Signed)
? ? ? ? ? ? ? ? Cristian Henderson ? 07/21/2021  ?  ? '@PREFPERIOPPHARMACY'$ @ ? ? Your procedure is scheduled on  07/24/2021. ? ? Report to [facility] at  [place] A.[facility]. ? ? Call this number if you have problems the morning of surgery: ? [phone] ? ? Remember: ? Follow the diet and prep instructions given to you by the office. ? ? Your last dose of iron should have been on 07/13/2021.  ? ?  Your last dose of xarelto should have been on 07/21/2021. ?  ? ? Take these medicines the morning of surgery with A SIP OF WATER  ? ?        allopurinol, neoral, diltiazem, lexapro, myfortic, omeprazole, flomax. ?  ? ? Do not wear jewelry, make-up or nail polish. ? Do not wear lotions, powders, or perfumes, or deodorant. ? Do not shave 48 hours prior to surgery.  Men may shave face and neck. ? Do not bring valuables to the hospital. ? [facility] is not responsible for any belongings or valuables. ? ?Contacts, dentures or bridgework may not be worn into surgery.  Leave your suitcase in the car.  After surgery it may be brought to your room. ? ?For patients admitted to the hospital, discharge time will be determined by your treatment team. ? ?Patients discharged the day of surgery will not be allowed to drive home and must have someone with them for 24 hours.  ? ? ?Special instructions:   DO NOT smoke tobacco or vape for 24 hours before your procedure. ? ?Please read over the following fact sheets that you were given. ?Anesthesia Post-op Instructions and Care and Recovery After Surgery ?  ? ? ? Upper Endoscopy, Adult, Care After ?This sheet gives you information about how to care for yourself after your procedure. Your health care provider may also give you more specific instructions. If you have problems or questions, contact your health care provider. ?What can I expect after the procedure? ?After the procedure, it is common to have: ?A sore throat. ?Mild stomach pain or discomfort. ?Bloating. ?Nausea. ?Follow these instructions at  home: ? ?Follow instructions from your health care provider about what to eat or drink after your procedure. ?Return to your normal activities as told by your health care provider. Ask your health care provider what activities are safe for you. ?Take over-the-counter and prescription medicines only as told by your health care provider. ?If you were given a sedative during the procedure, it can affect you for several hours. Do not drive or operate machinery until your health care provider says that it is safe. ?Keep all follow-up visits as told by your health care provider. This is important. ?Contact a health care provider if you have: ?A sore throat that lasts longer than one day. ?Trouble swallowing. ?Get help right away if: ?You vomit blood or your vomit looks like coffee grounds. ?You have: ?A fever. ?Bloody, black, or tarry stools. ?A severe sore throat or you cannot swallow. ?Difficulty breathing. ?Severe pain in your chest or abdomen. ?Summary ?After the procedure, it is common to have a sore throat, mild stomach discomfort, bloating, and nausea. ?If you were given a sedative during the procedure, it can affect you for several hours. Do not drive or operate machinery until your health care provider says that it is safe. ?Follow instructions from your health care provider about what to eat or drink after your procedure. ?Return to your normal activities as told by  your health care provider. ?This information is not intended to replace advice given to you by your health care provider. Make sure you discuss any questions you have with your health care provider. ?Document Revised: 01/26/2019 Document Reviewed: 08/22/2017 ?Elsevier Patient Education ? [place]. ?Colonoscopy, Adult, Care After ?The following information offers guidance on how to care for yourself after your procedure. Your health care provider may also give you more specific instructions. If you have problems or questions, contact your  health care provider. ?What can I expect after the procedure? ?After the procedure, it is common to have: ?A small amount of blood in your stool for 24 hours after the procedure. ?Some gas. ?Mild cramping or bloating of your abdomen. ?Follow these instructions at home: ?Eating and drinking ? ?Drink enough fluid to keep your urine pale yellow. ?Follow instructions from your health care provider about eating or drinking restrictions. ?Resume your normal diet as told by your health care provider. Avoid heavy or fried foods that are hard to digest. ?Activity ?Rest as told by your health care provider. ?Avoid sitting for a long time without moving. Get up to take short walks every 1-2 hours. This is important to improve blood flow and breathing. Ask for help if you feel weak or unsteady. ?Return to your normal activities as told by your health care provider. Ask your health care provider what activities are safe for you. ?Managing cramping and bloating ? ?Try walking around when you have cramps or feel bloated. ?If directed, apply heat to your abdomen as told by your health care provider. Use the heat source that your health care provider recommends, such as a moist heat pack or a heating pad. ?Place a towel between your skin and the heat source. ?Leave the heat on for 20-30 minutes. ?Remove the heat if your skin turns bright red. This is especially important if you are unable to feel pain, heat, or cold. You have a greater risk of getting burned. ?General instructions ?If you were given a sedative during the procedure, it can affect you for several hours. Do not drive or operate machinery until your health care provider says that it is safe. ?For the first 24 hours after the procedure: ?Do not sign important documents. ?Do not drink alcohol. ?Do your regular daily activities at a slower pace than normal. ?Eat soft foods that are easy to digest. ?Take over-the-counter and prescription medicines only as told by your  health care provider. ?Keep all follow-up visits. This is important. ?Contact a health care provider if: ?You have blood in your stool 2-3 days after the procedure. ?Get help right away if: ?You have more than a small spotting of blood in your stool. ?You have large blood clots in your stool. ?You have swelling of your abdomen. ?You have nausea or vomiting. ?You have a fever. ?You have increasing pain in your abdomen that is not relieved with medicine. ?These symptoms may be an emergency. Get help right away. Call 911. ?Do not wait to see if the symptoms will go away. ?Do not drive yourself to the hospital. ?Summary ?After the procedure, it is common to have a small amount of blood in your stool. You may also have mild cramping and bloating of your abdomen. ?If you were given a sedative during the procedure, it can affect you for several hours. Do not drive or operate machinery until your health care provider says that it is safe. ?Get help right away if you have a lot  of blood in your stool, nausea or vomiting, a fever, or increased pain in your abdomen. ?This information is not intended to replace advice given to you by your health care provider. Make sure you discuss any questions you have with your health care provider. ?Document Revised: 11/12/2020 Document Reviewed: 11/12/2020 ?Elsevier Patient Education ? [place]. ?Monitored Anesthesia Care, Care After ?This sheet gives you information about how to care for yourself after your procedure. Your health care provider may also give you more specific instructions. If you have problems or questions, contact your health care provider. ?What can I expect after the procedure? ?After the procedure, it is common to have: ?Tiredness. ?Forgetfulness about what happened after the procedure. ?Impaired judgment for important decisions. ?Nausea or vomiting. ?Some difficulty with balance. ?Follow these instructions at home: ?For the time period you were told by your  health care provider: ? ?  ? ?Rest as needed. ?Do not participate in activities where you could fall or become injured. ?Do not drive or use machinery. ?Do not drink alcohol. ?Do not take sleeping pills or medicines

## 2021-07-22 ENCOUNTER — Encounter (HOSPITAL_COMMUNITY)
Admission: RE | Admit: 2021-07-22 | Discharge: 2021-07-22 | Disposition: A | Discharge status: Home / Self Care | Payer: Medicare Other | Source: Ambulatory Visit | Attending: Gastroenterology | Admitting: Gastroenterology

## 2021-07-22 ENCOUNTER — Encounter (HOSPITAL_COMMUNITY): Payer: Self-pay

## 2021-07-23 ENCOUNTER — Ambulatory Visit (HOSPITAL_COMMUNITY): Payer: Medicare Other | Attending: Cardiology

## 2021-07-23 DIAGNOSIS — I351 Nonrheumatic aortic (valve) insufficiency: Secondary | ICD-10-CM | POA: Insufficient documentation

## 2021-07-23 LAB — ECHOCARDIOGRAM COMPLETE
Area-P 1/2: 2.8 cm2
P 1/2 time: 715 msec
S' Lateral: 3.3 cm

## 2021-07-24 ENCOUNTER — Encounter (HOSPITAL_COMMUNITY)
Admission: RE | Disposition: A | Discharge status: Home / Self Care | Payer: Self-pay | Source: Home / Self Care | Attending: Gastroenterology

## 2021-07-24 ENCOUNTER — Encounter (HOSPITAL_COMMUNITY): Payer: Self-pay | Admitting: Gastroenterology

## 2021-07-24 ENCOUNTER — Telehealth: Payer: Self-pay

## 2021-07-24 ENCOUNTER — Ambulatory Visit (HOSPITAL_COMMUNITY): Payer: Medicare Other | Admitting: Anesthesiology

## 2021-07-24 ENCOUNTER — Ambulatory Visit (HOSPITAL_BASED_OUTPATIENT_CLINIC_OR_DEPARTMENT_OTHER): Payer: Medicare Other | Admitting: Anesthesiology

## 2021-07-24 ENCOUNTER — Ambulatory Visit (HOSPITAL_COMMUNITY)
Admission: RE | Admit: 2021-07-24 | Discharge: 2021-07-24 | Disposition: A | Discharge status: Home / Self Care | Payer: Medicare Other | Attending: Gastroenterology | Admitting: Gastroenterology

## 2021-07-24 DIAGNOSIS — K219 Gastro-esophageal reflux disease without esophagitis: Secondary | ICD-10-CM | POA: Insufficient documentation

## 2021-07-24 DIAGNOSIS — Z940 Kidney transplant status: Secondary | ICD-10-CM | POA: Insufficient documentation

## 2021-07-24 DIAGNOSIS — K449 Diaphragmatic hernia without obstruction or gangrene: Secondary | ICD-10-CM | POA: Diagnosis not present

## 2021-07-24 DIAGNOSIS — K317 Polyp of stomach and duodenum: Secondary | ICD-10-CM

## 2021-07-24 DIAGNOSIS — Z7901 Long term (current) use of anticoagulants: Secondary | ICD-10-CM | POA: Insufficient documentation

## 2021-07-24 DIAGNOSIS — I120 Hypertensive chronic kidney disease with stage 5 chronic kidney disease or end stage renal disease: Secondary | ICD-10-CM | POA: Insufficient documentation

## 2021-07-24 DIAGNOSIS — D123 Benign neoplasm of transverse colon: Secondary | ICD-10-CM | POA: Diagnosis not present

## 2021-07-24 DIAGNOSIS — E7800 Pure hypercholesterolemia, unspecified: Secondary | ICD-10-CM | POA: Diagnosis not present

## 2021-07-24 DIAGNOSIS — D509 Iron deficiency anemia, unspecified: Secondary | ICD-10-CM | POA: Diagnosis not present

## 2021-07-24 DIAGNOSIS — K5730 Diverticulosis of large intestine without perforation or abscess without bleeding: Secondary | ICD-10-CM | POA: Insufficient documentation

## 2021-07-24 DIAGNOSIS — F419 Anxiety disorder, unspecified: Secondary | ICD-10-CM | POA: Diagnosis not present

## 2021-07-24 DIAGNOSIS — F32A Depression, unspecified: Secondary | ICD-10-CM | POA: Insufficient documentation

## 2021-07-24 DIAGNOSIS — M109 Gout, unspecified: Secondary | ICD-10-CM | POA: Insufficient documentation

## 2021-07-24 DIAGNOSIS — Z79621 Long term (current) use of calcineurin inhibitor: Secondary | ICD-10-CM | POA: Diagnosis not present

## 2021-07-24 DIAGNOSIS — K635 Polyp of colon: Secondary | ICD-10-CM

## 2021-07-24 DIAGNOSIS — I700 Atherosclerosis of aorta: Secondary | ICD-10-CM | POA: Insufficient documentation

## 2021-07-24 DIAGNOSIS — D124 Benign neoplasm of descending colon: Secondary | ICD-10-CM | POA: Insufficient documentation

## 2021-07-24 DIAGNOSIS — N186 End stage renal disease: Secondary | ICD-10-CM | POA: Diagnosis not present

## 2021-07-24 DIAGNOSIS — Z79899 Other long term (current) drug therapy: Secondary | ICD-10-CM | POA: Insufficient documentation

## 2021-07-24 DIAGNOSIS — D125 Benign neoplasm of sigmoid colon: Secondary | ICD-10-CM | POA: Insufficient documentation

## 2021-07-24 DIAGNOSIS — I4891 Unspecified atrial fibrillation: Secondary | ICD-10-CM | POA: Insufficient documentation

## 2021-07-24 DIAGNOSIS — Z7969 Long term (current) use of other immunomodulators and immunosuppressants: Secondary | ICD-10-CM | POA: Insufficient documentation

## 2021-07-24 HISTORY — PX: ESOPHAGOGASTRODUODENOSCOPY (EGD) WITH PROPOFOL: SHX5813

## 2021-07-24 HISTORY — PX: POLYPECTOMY: SHX149

## 2021-07-24 HISTORY — PX: COLONOSCOPY WITH PROPOFOL: SHX5780

## 2021-07-24 LAB — HM COLONOSCOPY

## 2021-07-24 SURGERY — COLONOSCOPY WITH PROPOFOL
Anesthesia: General

## 2021-07-24 MED ORDER — PROPOFOL 500 MG/50ML IV EMUL
INTRAVENOUS | Status: DC | PRN
  Administered 2021-07-24: 180 ug/kg/min via INTRAVENOUS

## 2021-07-24 MED ORDER — PROPOFOL 10 MG/ML IV BOLUS
INTRAVENOUS | Status: DC | PRN
  Administered 2021-07-24: 100 mg via INTRAVENOUS
  Administered 2021-07-24: 50 mg via INTRAVENOUS

## 2021-07-24 MED ORDER — STERILE WATER FOR IRRIGATION IR SOLN
Status: DC | PRN
  Administered 2021-07-24 (×2): 60 mL

## 2021-07-24 MED ORDER — LACTATED RINGERS IV SOLN: INTRAVENOUS | Status: DC

## 2021-07-24 MED ORDER — PHENYLEPHRINE HCL (PRESSORS) 10 MG/ML IV SOLN
INTRAVENOUS | Status: DC | PRN
  Administered 2021-07-24: 50 ug via INTRAVENOUS

## 2021-07-24 MED ORDER — LACTATED RINGERS IV SOLN: INTRAVENOUS | Status: DC | PRN

## 2021-07-24 NOTE — Anesthesia Preprocedure Evaluation (Signed)
Anesthesia Evaluation  ?Patient identified by MRN, date of birth, ID band ?Patient awake ? ? ? ?Reviewed: ?Allergy & Precautions, H&P , NPO status , Patient's Chart, lab work & pertinent test results, reviewed documented beta blocker date and time  ? ?Airway ?Mallampati: II ? ?TM Distance: >3 FB ?Neck ROM: full ? ? ? Dental ?no notable dental hx. ? ?  ?Pulmonary ?neg pulmonary ROS,  ?  ?Pulmonary exam normal ?breath sounds clear to auscultation ? ? ? ? ? ? Cardiovascular ?Exercise Tolerance: Good ?hypertension, + dysrhythmias Atrial Fibrillation  ?Rhythm:irregular Rate:Normal ? ? ?  ?Neuro/Psych ? Headaches, PSYCHIATRIC DISORDERS Anxiety Depression   ? GI/Hepatic ?Neg liver ROS, GERD  Medicated,  ?Endo/Other  ?negative endocrine ROS ? Renal/GU ?CRFRenal disease  ?negative genitourinary ?  ?Musculoskeletal ? ? Abdominal ?  ?Peds ? Hematology ? ?(+) Blood dyscrasia, anemia ,   ?Anesthesia Other Findings ??1. Left ventricular ejection fraction by 3D volume is 52 %. The left  ?ventricle has low normal function. The left ventricle demonstrates global  ?hypokinesis. Left ventricular diastolic parameters were normal.  ??2. Right ventricular systolic function is normal. The right ventricular  ?size is normal.  ??3. The mitral valve is normal in structure. Trivial mitral valve  ?regurgitation. No evidence of mitral stenosis.  ??4. The aortic valve is tricuspid. Aortic valve regurgitation is mild. No  ?aortic stenosis is present. Aortic regurgitation PHT measures 715 msec.  ??5. The inferior vena cava is normal in size with greater than 50%  ?respiratory variability, suggesting right atrial pressure of 3 mmHg.  ? Reproductive/Obstetrics ?negative OB ROS ? ?  ? ? ? ? ? ? ? ? ? ? ? ? ? ?  ?  ? ? ? ? ? ? ? ? ?Anesthesia Physical ?Anesthesia Plan ? ?ASA: 3 ? ?Anesthesia Plan: General  ? ?Post-op Pain Management:   ? ?Induction:  ? ?PONV Risk Score and Plan: Propofol infusion ? ?Airway Management  Planned:  ? ?Additional Equipment:  ? ?Intra-op Plan:  ? ?Post-operative Plan:  ? ?Informed Consent: I have reviewed the patients History and Physical, chart, labs and discussed the procedure including the risks, benefits and alternatives for the proposed anesthesia with the patient or authorized representative who has indicated his/her understanding and acceptance.  ? ? ? ?Dental Advisory Given ? ?Plan Discussed with: CRNA ? ?Anesthesia Plan Comments:   ? ? ? ? ? ? ?Anesthesia Quick Evaluation ? ?

## 2021-07-24 NOTE — Discharge Instructions (Addendum)
You are being discharged to home.  ?Resume your previous diet.  ?We are waiting for your pathology results.  ?Restart Eliquis tomorrow. ?Your physician has recommended a repeat colonoscopy for surveillance based on pathology results.  ?Continue oral iron. ?

## 2021-07-24 NOTE — Anesthesia Postprocedure Evaluation (Signed)
Anesthesia Post Note ? ?Patient: Cristian Henderson ? ?Procedure(s) Performed: COLONOSCOPY WITH PROPOFOL ?ESOPHAGOGASTRODUODENOSCOPY (EGD) WITH PROPOFOL ?POLYPECTOMY INTESTINAL ? ?Patient location during evaluation: Phase II ?Anesthesia Type: General ?Level of consciousness: awake ?Pain management: pain level controlled ?Vital Signs Assessment: post-procedure vital signs reviewed and stable ?Respiratory status: spontaneous breathing and respiratory function stable ?Cardiovascular status: blood pressure returned to baseline and stable ?Postop Assessment: no headache and no apparent nausea or vomiting ?Anesthetic complications: no ?Comments: Late entry ? ? ?No notable events documented. ? ? ?Last Vitals:  ?Vitals:  ? 07/24/21 0715 07/24/21 0838  ?BP: 140/68 (!) 118/55  ?Pulse: 61   ?Resp: 15 16  ?Temp: (!) 36.3 ?C 36.5 ?C  ?SpO2: 96% 99%  ?  ?Last Pain:  ?Vitals:  ? 07/24/21 0838  ?TempSrc:   ?PainSc: 0-No pain  ? ? ?  ?  ?  ?  ?  ?  ? ?Louann Sjogren ? ? ? ? ?

## 2021-07-24 NOTE — Op Note (Addendum)
[facility] ?Patient Name: Cristian Henderson ?Procedure Date: 07/24/2021 7:46 AM ?MRN: [id number] ?Date of Birth: 08-31-1945 ?Attending MD: [name] ,  ?CSN: [id number] ?Age: 76 ?Admit Type: Outpatient ?Procedure:                Upper GI endoscopy ?Indications:              Iron deficiency anemia ?Providers:                [name], Crystal Page, Raphael Gibney,  ?                          Technician ?Referring MD:              ?Medicines:                Monitored Anesthesia Care ?Complications:            No immediate complications. ?Estimated Blood Loss:     Estimated blood loss: none. ?Procedure:                Pre-Anesthesia Assessment: ?                          - Prior to the procedure, a History and Physical  ?                          was performed, and patient medications, allergies  ?                          and sensitivities were reviewed. The patient's  ?                          tolerance of previous anesthesia was reviewed. ?                          - The risks and benefits of the procedure and the  ?                          sedation options and risks were discussed with the  ?                          patient. All questions were answered and informed  ?                          consent was obtained. ?                          - ASA Grade Assessment: III - A patient with severe  ?                          systemic disease. ?                          After obtaining informed consent, the endoscope was  ?                          passed under direct vision. Throughout the  ?  procedure, the patient's blood pressure, pulse, and  ?                          oxygen saturations were monitored continuously. The  ?                          GIF-H190 ([id number]) scope was introduced through the  ?                          mouth, and advanced to the second part of duodenum.  ?                          The upper GI endoscopy was accomplished without  ?                           difficulty. The patient tolerated the procedure  ?                          well. ?Scope In: 7:51:58 AM ?Scope Out: 7:56:52 AM ?Total Procedure Duration: 0 hours 4 minutes 54 seconds  ?Findings: ?     A 1 cm hiatal hernia was present. ?     Three medium sessile fundic gland polyps with no bleeding and no  ?     stigmata of recent bleeding were found in the gastric body. ?     The examined duodenum was normal. ?Impression:               - 1 cm hiatal hernia. ?                          - Three fundic gland polyps. ?                          - Normal examined duodenum. ?                          - No specimens collected. ?Moderate Sedation: ?     Per Anesthesia Care ?Recommendation:           - Discharge patient to home (ambulatory). ?                          - Resume previous diet. ?                          - Await pathology results. ?                          - Restart Eliquis tonight. ?Procedure Code(s):        --- Professional --- ?                          [phone], Esophagogastroduodenoscopy, flexible,  ?                          transoral; diagnostic, including collection of  ?  specimen(s) by brushing or washing, when performed  ?                          (separate procedure) ?Diagnosis Code(s):        --- Professional --- ?                          K44.9, Diaphragmatic hernia without obstruction or  ?                          gangrene ?                          K31.7, Polyp of stomach and duodenum ?                          D50.9, Iron deficiency anemia, unspecified ?CPT copyright 2019 [employer]. All rights reserved. ?The codes documented in this report are preliminary and upon coder review may  ?be revised to meet current compliance requirements. ?[name], MD ?[name],  ?07/24/2021 8:01:27 AM ?This report has been signed electronically. ?Number of Addenda: 0 ?

## 2021-07-24 NOTE — Transfer of Care (Signed)
Immediate Anesthesia Transfer of Care Note ? ?Patient: Joban Colledge ? ?Procedure(s) Performed: COLONOSCOPY WITH PROPOFOL ?ESOPHAGOGASTRODUODENOSCOPY (EGD) WITH PROPOFOL ?POLYPECTOMY INTESTINAL ? ?Patient Location: PACU ? ?Anesthesia Type:General ? ?Level of Consciousness: awake, alert  and oriented ? ?Airway & Oxygen Therapy: Patient Spontanous Breathing ? ?Post-op Assessment: Report given to RN, Post -op Vital signs reviewed and stable, Patient moving all extremities X 4 and Patient able to stick tongue midline ? ?Post vital signs: Reviewed  ? ? ?Last Vitals:  ?Vitals Value Taken Time  ?BP 118/55   ?Temp 97.7   ?Pulse 61   ?Resp 18   ?SpO2 98   ? ? ?Last Pain:  ?Vitals:  ? 07/24/21 0748  ?TempSrc:   ?PainSc: 0-No pain  ?   ? ?  ? ?Complications: No notable events documented. ?

## 2021-07-24 NOTE — H&P (Signed)
Cristian Henderson is an 76 y.o. male.   ?Chief Complaint: iron def anemia ?HPI: Cristian Henderson is a 76 y.o. 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 ? ?The patient denies having any nausea, vomiting, fever, chills, hematochezia, melena, hematemesis, abdominal distention, abdominal pain, diarrhea, jaundice, pruritus or weight loss. ? ?Last Hb on 05/2021 was 13.7. ? ?Past Medical History:  ?Diagnosis Date  ? Acid reflux   ? Anxiety   ? Arthritis   ? Atrial fibrillation ([place])   ? Depression   ? Dysrhythmia   ? AFIB  ? Gout   ? High cholesterol   ? Hypertension   ? Kidney failure   ? PTSD (post-traumatic stress disorder)   ? ? ?Past Surgical History:  ?Procedure Laterality Date  ? ATRIAL FIBRILLATION ABLATION N/A 10/03/2020  ? Procedure: ATRIAL FIBRILLATION ABLATION;  Surgeon: Constance Haw, MD;  Location: [facility];  Service: Cardiovascular;  Laterality: N/A;  ? CYSTOSCOPY  08/07/2020  ? Procedure: CYSTOSCOPY, INSERTION OF FOLEY CATHETER;  Surgeon: Cleon Gustin, MD;  Location: [facility];  Service: Urology;;  ? KIDNEY TRANSPLANT Right 2013  ? REPLACEMENT TOTAL KNEE Right   ? SKIN CANCER EXCISION    ? TOTAL SHOULDER REPLACEMENT Left   ? ? ?Family History  ?Problem Relation Age of Onset  ? Alzheimer's disease Mother   ? Diabetes Mother   ? ?Social History:  reports that he has never smoked. He has been exposed to tobacco smoke. He has never used smokeless tobacco. He reports that he does not currently use alcohol. He reports that he does not use drugs. ? ?Allergies:  ?Allergies  ?Allergen Reactions  ? Doxycycline Hyclate Hives  ? Simvastatin Other (See Comments)  ?  Pt states he loss all use of muscles and he was in the hospital for 10 days   ? Sirolimus Other (See Comments)  ?   Muscle pain  ? ? ?Medications Prior to Admission  ?Medication Sig Dispense Refill  ? acetaminophen (TYLENOL) 500 MG tablet Take 1,000 mg by mouth every 6 (six) hours as needed for mild pain or moderate pain (Back pain).    ? allopurinol (ZYLOPRIM) 100 MG tablet Take 0.5 tablets (50 mg total) by mouth daily. 30 tablet 11  ? cholecalciferol (VITAMIN D3) 25 MCG (1000 UNIT) tablet Take 1,000 Units by mouth in the morning and at bedtime.    ? cycloSPORINE modified (NEORAL) 25 MG capsule Take 75 mg by mouth 2 (two) times daily.    ? diltiazem (DILACOR XR) 240 MG 24 hr capsule Take 240 mg by mouth daily.    ? escitalopram (LEXAPRO) 10 MG tablet Take 1 tablet (10 mg total) by mouth daily. 30 tablet 3  ? ezetimibe (ZETIA) 10 MG tablet Take 1 tablet (10 mg total) by mouth daily. 90 tablet 1  ? mycophenolate (MYFORTIC) 180 MG EC tablet Take 180 mg by mouth 2 (two) times daily.    ? Omega-3 Fatty Acids (FISH OIL) 1200 MG CAPS Take 2,400 mg by mouth in the morning and at bedtime.    ? omeprazole (PRILOSEC) 40 MG capsule Take 40 mg by mouth daily.    ? Rivaroxaban (XARELTO) 15 MG TABS tablet Take 15 mg by mouth daily with breakfast.    ? sodium bicarbonate 650 MG tablet Take 650 mg  by mouth 2 (two) times daily.    ? tamsulosin (FLOMAX) 0.4 MG CAPS capsule Take 1 capsule (0.4 mg total) by mouth daily. 30 capsule 11  ? polyethylene glycol powder (GLYCOLAX/MIRALAX) 17 GM/SCOOP powder Take 255 g by mouth daily. 255 g 3  ? ? ?No results found for this or any previous visit (from the past 48 hour(s)). ?ECHOCARDIOGRAM COMPLETE ? ?Result Date: 07/23/2021 ?   ECHOCARDIOGRAM REPORT   Patient Name:   Cristian Henderson Date of Exam: 07/23/2021 Medical Rec #:  [id number]      Height:       65.0 in Accession #:    [id number]     Weight:       165.0 lb Date of Birth:  10-20-1945       BSA:          1.823 m? Patient Age:    10 years       BP:           145/72 mmHg Patient Gender: M              HR:           62 bpm. Exam Location:  [address]  Procedure: 2D Echo, 3D Echo, Cardiac Doppler, Color Doppler and Strain Analysis Indications:    I35.1 Aortic insufficiency  History:        Patient has prior history of Echocardiogram examinations, most                 recent 06/05/2020. Arrythmias:Atrial Fibrillation; Risk                 Factors:Hypertension and Dyslipidemia. CKD stage 3. Anemia.  Sonographer:    Basilia Jumbo BS, RDCS Referring Phys: [place]  1. Left ventricular ejection fraction by 3D volume is 52 %. The left ventricle has low normal function. The left ventricle demonstrates global hypokinesis. Left ventricular diastolic parameters were normal.  2. Right ventricular systolic function is normal. The right ventricular size is normal.  3. The mitral valve is normal in structure. Trivial mitral valve regurgitation. No evidence of mitral stenosis.  4. The aortic valve is tricuspid. Aortic valve regurgitation is mild. No aortic stenosis is present. Aortic regurgitation PHT measures 715 msec.  5. The inferior vena cava is normal in size with greater than 50% respiratory variability, suggesting right atrial pressure of 3 mmHg. Comparison(s): 06/05/20 EF 55-60%. Mild-moderate AI. FINDINGS  Left Ventricle: Left ventricular ejection fraction by 3D volume is 52 %. The left ventricle has low normal function. The left ventricle demonstrates global hypokinesis. Global longitudinal strain performed but not reported based on interpreter judgement  due to suboptimal tracking. The left ventricular internal cavity size was normal in size. There is no left ventricular hypertrophy. Left ventricular diastolic parameters were normal. Right Ventricle: The right ventricular size is normal. No increase in right ventricular wall thickness. Right ventricular systolic function is normal. Left Atrium: Left atrial size was normal in size. Right Atrium: Right atrial size was normal in size. Pericardium: There is no evidence of pericardial effusion. Mitral  Valve: The mitral valve is normal in structure. Trivial mitral valve regurgitation. No evidence of mitral valve stenosis. Tricuspid Valve: The tricuspid valve is normal in structure. Tricuspid valve regurgitation is trivial. No evidence of tricuspid stenosis. Aortic Valve: The aortic valve is tricuspid. Aortic valve regurgitation is mild. Aortic regurgitation PHT measures 715 msec. No aortic stenosis is present. Pulmonic Valve: The pulmonic valve was normal  in structure. Pulmonic valve regurgitation is not visualized. No evidence of pulmonic stenosis. Aorta: The aortic root is normal in size and structure. Venous: The inferior vena cava is normal in size with greater than 50% respiratory variability, suggesting right atrial pressure of 3 mmHg. IAS/Shunts: No atrial level shunt detected by color flow Doppler.  LEFT VENTRICLE PLAX 2D LVIDd:         4.60 cm         Diastology LVIDs:         3.30 cm         LV e' medial:    7.58 cm/s LV PW:         1.00 cm         LV E/e' medial:  7.7 LV IVS:        1.10 cm         LV e' lateral:   11.90 cm/s LVOT diam:     2.40 cm         LV E/e' lateral: 4.9 LV SV:         85 LV SV Index:   47              2D LVOT Area:     4.52 cm?        Longitudinal                                Strain                                2D Strain GLS  -18.6 %                                (A2C):                                2D Strain GLS  -17.7 %                                (A3C):                                2D Strain GLS  -22.2 %                                (A4C):                                2D Strain GLS  -19.5 %                                Avg:                                 3D Volume EF                                LV  3D EF:    Left                                             ventricul                                             ar                                             ejection                                             fraction                                              by 3D                                             volume is                                             52 %.                                 3D Volume EF:                                3D EF:        52 %

## 2021-07-24 NOTE — Telephone Encounter (Signed)
Called patient with echo results. 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 This was after patient was having migraines and had a stroke. Patient will bring records of this to our office. Patient wanted to know what he is suppose to go from here to treat this new finding and does he need to go up on his xarelto, right now on 15 mg. Creatinine is 1.38 and BUN 29. Will forward to Dr. Johnsie Cancel for advisement.  ?

## 2021-07-24 NOTE — Op Note (Signed)
[facility] ?Patient Name: Cristian Henderson ?Procedure Date: 07/24/2021 7:45 AM ?MRN: [id number] ?Date of Birth: 01-17-1946 ?Attending MD: [name] ,  ?CSN: [id number] ?Age: 76 ?Admit Type: Outpatient ?Procedure:                Colonoscopy ?Indications:              Iron deficiency anemia ?Providers:                [name], Crystal Page, Raphael Gibney,  ?                          Technician ?Referring MD:              ?Medicines:                Monitored Anesthesia Care ?Complications:            No immediate complications. ?Estimated Blood Loss:     Estimated blood loss: none. ?Procedure:                Pre-Anesthesia Assessment: ?                          - Prior to the procedure, a History and Physical  ?                          was performed, and patient medications, allergies  ?                          and sensitivities were reviewed. The patient's  ?                          tolerance of previous anesthesia was reviewed. ?                          - The risks and benefits of the procedure and the  ?                          sedation options and risks were discussed with the  ?                          patient. All questions were answered and informed  ?                          consent was obtained. ?                          - ASA Grade Assessment: III - A patient with severe  ?                          systemic disease. ?                          After obtaining informed consent, the colonoscope  ?                          was passed under direct vision. Throughout the  ?  procedure, the patient's blood pressure, pulse, and  ?                          oxygen saturations were monitored continuously. The  ?                          [id number]-HQ190L ([id number]) scope was introduced through  ?                          the anus and advanced to the the terminal ileum.  ?                          The colonoscopy was performed without difficulty.  ?                          The  patient tolerated the procedure well. The  ?                          quality of the bowel preparation was good. ?Scope In: 8:02:24 AM ?Scope Out: 8:31:33 AM ?Scope Withdrawal Time: 0 hours 24 minutes 0 seconds  ?Total Procedure Duration: 0 hours 29 minutes 9 seconds  ?Findings: ?     The perianal and digital rectal examinations were normal. ?     The terminal ileum appeared normal. ?     Three sessile polyps were found in the descending colon and transverse  ?     colon. The polyps were 3 to 5 mm in size. These polyps were removed with  ?     a cold snare. Resection and retrieval were complete. ?     A 15 mm polyp was found in the descending colon. The polyp was  ?     semi-pedunculated. The polyp was removed with a piecemeal technique in  ?     two pieces using a cold snare. Resection and retrieval were complete. ?     A 4 mm polyp was found in the sigmoid colon. The polyp was sessile. The  ?     polyp was removed with a cold snare. Resection and retrieval were  ?     complete. ?     Scattered small and large-mouthed diverticula were found in the sigmoid  ?     colon and transverse colon. ?     The retroflexed view of the distal rectum and anal verge was normal and  ?     showed no anal or rectal abnormalities. ?Impression:               - The examined portion of the ileum was normal. ?                          - Three 3 to 5 mm polyps in the descending colon  ?                          and in the transverse colon, removed with a cold  ?                          snare. Resected and retrieved. ?                          -  One 15 mm polyp in the descending colon, removed  ?                          piecemeal using a cold snare. Resected and  ?                          retrieved. ?                          - One 4 mm polyp in the sigmoid colon, removed with  ?                          a cold snare. Resected and retrieved. ?                          - Diverticulosis in the sigmoid colon and in the  ?                           transverse colon. ?                          - The distal rectum and anal verge are normal on  ?                          retroflexion view. ?Moderate Sedation: ?     Per Anesthesia Care ?Recommendation:           - Discharge patient to home (ambulatory). ?                          - Resume previous diet. ?                          - Await pathology results. ?                          - Repeat colonoscopy for surveillance based on  ?                          pathology results. ?                          - Continue oral iron ?Procedure Code(s):        --- Professional --- ?                          [phone], Colonoscopy, flexible; with removal of  ?                          tumor(s), polyp(s), or other lesion(s) by snare  ?                          technique ?Diagnosis Code(s):        --- Professional --- ?                          K63.5, Polyp of colon ?  D50.9, Iron deficiency anemia, unspecified ?                          K57.30, Diverticulosis of large intestine without  ?                          perforation or abscess without bleeding ?CPT copyright 2019 [employer] Association. All rights reserved. ?The codes documented in this report are preliminary and upon coder review may  ?be revised to meet current compliance requirements. ?[name], MD ?[name],  ?07/24/2021 8:41:12 AM ?This report has been signed electronically. ?Number of Addenda: 0 ?

## 2021-07-24 NOTE — Telephone Encounter (Signed)
-----   Message from Josue Hector, MD sent at 07/23/2021  4:24 PM EDT ----- ?Low normal EF just mild AR overall good ?

## 2021-07-27 ENCOUNTER — Encounter (INDEPENDENT_AMBULATORY_CARE_PROVIDER_SITE_OTHER): Payer: Self-pay | Admitting: *Deleted

## 2021-07-27 ENCOUNTER — Ambulatory Visit (INDEPENDENT_AMBULATORY_CARE_PROVIDER_SITE_OTHER): Payer: Medicare Other | Admitting: Gastroenterology

## 2021-07-27 LAB — SURGICAL PATHOLOGY

## 2021-07-27 NOTE — Telephone Encounter (Signed)
Per Dr. Johnsie Cancel, Cristian Henderson no Rx for occluded vertebral he should have f/u with neurology. Called patient's wife and informed her of Dr. Johnsie Cancel advisement. Patient's wife verbalized understanding. ?

## 2021-07-28 ENCOUNTER — Encounter (HOSPITAL_COMMUNITY): Payer: Self-pay | Admitting: Gastroenterology

## 2021-08-13 ENCOUNTER — Encounter: Payer: Self-pay | Admitting: Internal Medicine

## 2021-08-13 ENCOUNTER — Ambulatory Visit (INDEPENDENT_AMBULATORY_CARE_PROVIDER_SITE_OTHER): Payer: Medicare Other | Admitting: Internal Medicine

## 2021-08-13 VITALS — BP 138/74 | HR 72 | Wt 165.0 lb

## 2021-08-13 DIAGNOSIS — N1832 Chronic kidney disease, stage 3b: Secondary | ICD-10-CM

## 2021-08-13 DIAGNOSIS — G43719 Chronic migraine without aura, intractable, without status migrainosus: Secondary | ICD-10-CM

## 2021-08-13 DIAGNOSIS — M109 Gout, unspecified: Secondary | ICD-10-CM

## 2021-08-13 DIAGNOSIS — I480 Paroxysmal atrial fibrillation: Secondary | ICD-10-CM

## 2021-08-13 MED ORDER — COLCHICINE 0.6 MG PO TABS: ORAL_TABLET | ORAL | 0 refills | Status: AC

## 2021-08-13 NOTE — Patient Instructions (Signed)
Please take Colchicine for gout flareup as discussed. ? ?Please continue taking medications as prescribed. ?

## 2021-08-13 NOTE — Assessment & Plan Note (Signed)
S/p kidney transplant ?On Cyclosporine, Mycophenolate ?On Sodium bicarb ?F/u with Nephrology ?

## 2021-08-13 NOTE — Assessment & Plan Note (Addendum)
Rate-controlled with Diltiazem On Xarelto for AC S/p cardiac ablation in 07/22 

## 2021-08-13 NOTE — Assessment & Plan Note (Addendum)
Prescribed Colchicine, did not have good response to steroid in the past ?Cautious use while being on Cyclosporine ?Advised to report with any unusual symptoms ?On Allopurinol ?Will refer to Orthopedic surgeon if persistent symptoms ?

## 2021-08-13 NOTE — Assessment & Plan Note (Signed)
Headache deemed to be migraine ?Tylenol as needed ?

## 2021-08-13 NOTE — Progress Notes (Signed)
? ?Established Patient Office Visit ? ?Subjective:  ?Patient ID: Cristian Henderson, male    DOB: 09-Jan-1946  Age: 76 y.o. MRN: [id number] ? ?CC:  ?Chief Complaint  ?Patient presents with  ? Gout  ?  Right great toe swelling x 3 days  ? ? ?HPI ?Traxton Kolenda is a 76 y.o. male with past medical history of kidney transplant, paroxysmal A Fib, HTN, HLD, gout, PTSD and BPH who presents for f/u of his chronic medical conditions. ? ?He complains of right great toe swelling x 3 days.  Denies any recent alcohol intake.  Denies any smoked or red meat intake recently.  He has had gout flareups in the past, but did not improve with oral steroids and had to take oral colchicine.  He denies any fever, chills, nausea or vomiting currently. ? ?He was recently hospitalized with severe headache, and had work-up for  subarachnoid hemorrhage including CT of the head, MRI of the brain and lumbar puncture, which was negative for acute subarachnoid hemorrhage.  He also saw Neurology in the outpatient setting, and was told of migraine.  He currently denies any headache, dizziness or visual disturbance.  He went to [place] clinic, and had CT of the neck, which showed vertebral artery occlusion, which was deemed to be appropriate for medical management.  He is already on Xarelto for A-fib currently. ? ?HTN and A Fib: BP well-controlled. On Diltiazem only currently.  Takes Xarelto for [facility].  Denies any chest pain or dyspnea. ? ? ?Past Medical History:  ?Diagnosis Date  ? Acid reflux   ? Anxiety   ? Arthritis   ? Atrial fibrillation ([place])   ? Depression   ? Dysrhythmia   ? AFIB  ? Gout   ? High cholesterol   ? Hypertension   ? Kidney failure   ? PTSD (post-traumatic stress disorder)   ? ? ?Past Surgical History:  ?Procedure Laterality Date  ? ATRIAL FIBRILLATION ABLATION N/A 10/03/2020  ? Procedure: ATRIAL FIBRILLATION ABLATION;  Surgeon: Constance Haw, MD;  Location: [facility];  Service: Cardiovascular;  Laterality: N/A;  ? COLONOSCOPY  WITH PROPOFOL N/A 07/24/2021  ? Procedure: COLONOSCOPY WITH PROPOFOL;  Surgeon: Harvel Quale, MD;  [facility];  Service: Gastroenterology;  Laterality: N/A;  730  ? CYSTOSCOPY  08/07/2020  ? Procedure: CYSTOSCOPY, INSERTION OF FOLEY CATHETER;  Surgeon: Cleon Gustin, MD;  Location: [facility];  Service: Urology;;  ? ESOPHAGOGASTRODUODENOSCOPY (EGD) WITH PROPOFOL N/A 07/24/2021  ? Procedure: ESOPHAGOGASTRODUODENOSCOPY (EGD) WITH PROPOFOL;  Surgeon: Harvel Quale, MD;  [facility];  Service: Gastroenterology;  Laterality: N/A;  ? KIDNEY TRANSPLANT Right 2013  ? POLYPECTOMY  07/24/2021  ? Procedure: POLYPECTOMY INTESTINAL;  Surgeon: Montez Morita, Quillian Quince, MD;  [facility];  Service: Gastroenterology;;  ? REPLACEMENT TOTAL KNEE Right   ? SKIN CANCER EXCISION    ? TOTAL SHOULDER REPLACEMENT Left   ? ? ?Family History  ?Problem Relation Age of Onset  ? Alzheimer's disease Mother   ? Diabetes Mother   ? ? ?Social History  ? ?Socioeconomic History  ? Marital status: Married  ?  Spouse name: Lovey Newcomer  ? Number of children: 3  ? Years of education: 3  ? Highest education level: Associate degree: academic program  ?Occupational History  ? Occupation: retired  ?Tobacco Use  ? Smoking status: Never  ?  Passive exposure: Past  ? Smokeless tobacco: Never  ?Vaping Use  ? Vaping Use: Never used  ?  Substance and Sexual Activity  ? Alcohol use: Not Currently  ? Drug use: Never  ? Sexual activity: Yes  ?Other Topics Concern  ? Not on file  ?Social History Narrative  ? Not on file  ? ?Social Determinants of Health  ? ?Financial Resource Strain: Low Risk   ? Difficulty of Paying Living Expenses: Not hard at all  ?Food Insecurity: No Food Insecurity  ? Worried About Charity fundraiser in the Last Year: Never true  ? Ran Out of Food in the Last Year: Never true  ?Transportation Needs: No Transportation Needs  ? Lack of Transportation (Medical): No  ? Lack of Transportation  (Non-Medical): No  ?Physical Activity: Unknown  ? Days of Exercise per Week: 3 days  ? Minutes of Exercise per Session: Not on file  ?Stress: Not on file  ?Social Connections: Moderately Integrated  ? Frequency of Communication with Friends and Family: More than three times a week  ? Frequency of Social Gatherings with Friends and Family: Twice a week  ? Attends Religious Services: More than 4 times per year  ? Active Member of Clubs or Organizations: No  ? Attends Archivist Meetings: Never  ? Marital Status: Married  ?Intimate Partner Violence: Not At Risk  ? Fear of Current or Ex-Partner: No  ? Emotionally Abused: No  ? Physically Abused: No  ? Sexually Abused: No  ? ? ?Outpatient Medications Prior to Visit  ?Medication Sig Dispense Refill  ? acetaminophen (TYLENOL) 500 MG tablet Take 1,000 mg by mouth every 6 (six) hours as needed for mild pain or moderate pain (Back pain).    ? allopurinol (ZYLOPRIM) 100 MG tablet Take 0.5 tablets (50 mg total) by mouth daily. 30 tablet 11  ? cholecalciferol (VITAMIN D3) 25 MCG (1000 UNIT) tablet Take 1,000 Units by mouth in the morning and at bedtime.    ? cycloSPORINE modified (NEORAL) 25 MG capsule Take 75 mg by mouth 2 (two) times daily.    ? diltiazem (DILACOR XR) 240 MG 24 hr capsule Take 240 mg by mouth daily.    ? escitalopram (LEXAPRO) 10 MG tablet Take 1 tablet (10 mg total) by mouth daily. 30 tablet 3  ? ezetimibe (ZETIA) 10 MG tablet Take 1 tablet (10 mg total) by mouth daily. 90 tablet 1  ? mycophenolate (MYFORTIC) 180 MG EC tablet Take 180 mg by mouth 2 (two) times daily.    ? Omega-3 Fatty Acids (FISH OIL) 1200 MG CAPS Take 2,400 mg by mouth in the morning and at bedtime.    ? omeprazole (PRILOSEC) 40 MG capsule Take 40 mg by mouth daily.    ? polyethylene glycol powder (GLYCOLAX/MIRALAX) 17 GM/SCOOP powder Take 255 g by mouth daily. 255 g 3  ? Rivaroxaban (XARELTO) 15 MG TABS tablet Take 15 mg by mouth daily with breakfast.    ? sodium bicarbonate 650  MG tablet Take 650 mg by mouth 2 (two) times daily.    ? tamsulosin (FLOMAX) 0.4 MG CAPS capsule Take 1 capsule (0.4 mg total) by mouth daily. 30 capsule 11  ? ?No facility-administered medications prior to visit.  ? ? ?Allergies  ?Allergen Reactions  ? Doxycycline Hyclate Hives  ? Simvastatin Other (See Comments)  ?  Pt states he loss all use of muscles and he was in the hospital for 10 days   ? Sirolimus Other (See Comments)  ?  Muscle pain  ? ? ?ROS ?Review of Systems  ?Constitutional:  Negative for  chills and fever.  ?HENT:  Negative for congestion and sore throat.   ?Eyes:  Negative for pain and discharge.  ?Respiratory:  Negative for cough and shortness of breath.   ?Cardiovascular:  Positive for palpitations (Intermittent). Negative for chest pain.  ?Gastrointestinal:  Negative for constipation, diarrhea, nausea and vomiting.  ?Endocrine: Negative for polydipsia and polyuria.  ?Genitourinary:  Negative for dysuria and hematuria.  ?Musculoskeletal:  Positive for joint swelling (R great toe). Negative for neck pain and neck stiffness.  ?Skin:  Negative for rash.  ?Neurological:  Negative for dizziness, weakness, numbness and headaches.  ?Psychiatric/Behavioral:  Negative for agitation and behavioral problems.   ? ?  ?Objective:  ?  ?Physical Exam ?Vitals reviewed.  ?Constitutional:   ?   General: He is not in acute distress. ?   Appearance: He is not diaphoretic.  ?HENT:  ?   Head: Normocephalic and atraumatic.  ?   Nose: Nose normal.  ?   Mouth/Throat:  ?   Mouth: Mucous membranes are moist.  ?Eyes:  ?   General: No scleral icterus. ?   Extraocular Movements: Extraocular movements intact.  ?Cardiovascular:  ?   Rate and Rhythm: Normal rate and regular rhythm.  ?   Pulses: Normal pulses.  ?   Heart sounds: Normal heart sounds. No murmur heard. ?Pulmonary:  ?   Breath sounds: Normal breath sounds. No wheezing or rales.  ?Abdominal:  ?   Palpations: Abdomen is soft.  ?   Tenderness: There is no abdominal  tenderness.  ?Musculoskeletal:     ?   General: Swelling (Right first MTP joint) present.  ?   Cervical back: Neck supple. No tenderness.  ?   Right lower leg: No edema.  ?   Left lower leg: No edema.  ?Skin: ?   Gene

## 2021-10-12 ENCOUNTER — Ambulatory Visit (INDEPENDENT_AMBULATORY_CARE_PROVIDER_SITE_OTHER): Payer: Medicare Other | Admitting: Gastroenterology

## 2021-11-19 LAB — HEMOGLOBIN A1C: Hemoglobin A1C: 6.7

## 2021-11-20 ENCOUNTER — Ambulatory Visit (HOSPITAL_COMMUNITY)
Admission: RE | Admit: 2021-11-20 | Discharge: 2021-11-20 | Disposition: A | Discharge status: Home / Self Care | Payer: Medicare Other | Source: Ambulatory Visit | Attending: Urology | Admitting: Urology

## 2021-11-20 ENCOUNTER — Other Ambulatory Visit: Payer: Self-pay | Admitting: Urology

## 2021-11-20 DIAGNOSIS — N131 Hydronephrosis with ureteral stricture, not elsewhere classified: Secondary | ICD-10-CM

## 2021-11-20 DIAGNOSIS — R339 Retention of urine, unspecified: Secondary | ICD-10-CM | POA: Diagnosis present

## 2021-11-27 ENCOUNTER — Ambulatory Visit (INDEPENDENT_AMBULATORY_CARE_PROVIDER_SITE_OTHER): Payer: Medicare Other | Admitting: Urology

## 2021-11-27 VITALS — BP 121/64 | HR 71

## 2021-11-27 DIAGNOSIS — N131 Hydronephrosis with ureteral stricture, not elsewhere classified: Secondary | ICD-10-CM

## 2021-11-27 DIAGNOSIS — N3000 Acute cystitis without hematuria: Secondary | ICD-10-CM

## 2021-11-27 LAB — MICROSCOPIC EXAMINATION
Epithelial Cells (non renal): NONE SEEN /hpf (ref 0–10)
Renal Epithel, UA: NONE SEEN /hpf
WBC, UA: NONE SEEN /hpf (ref 0–5)

## 2021-11-27 LAB — URINALYSIS, ROUTINE W REFLEX MICROSCOPIC
Bilirubin, UA: NEGATIVE
Glucose, UA: NEGATIVE
Ketones, UA: NEGATIVE
Leukocytes,UA: NEGATIVE
Nitrite, UA: NEGATIVE
Specific Gravity, UA: 1.02 (ref 1.005–1.030)
Urobilinogen, Ur: 4 mg/dL — ABNORMAL HIGH (ref 0.2–1.0)
pH, UA: 6.5 (ref 5.0–7.5)

## 2021-11-27 MED ORDER — SULFAMETHOXAZOLE-TRIMETHOPRIM 800-160 MG PO TABS: 1.0000 | ORAL_TABLET | Freq: Two times a day (BID) | ORAL | 0 refills | Status: DC

## 2021-11-27 NOTE — Progress Notes (Unsigned)
11/27/2021 10:42 AM   Cristian Henderson 05/18/1945 [id number]  Referring provider: [name], MD [address] [place],  [place] [address]  No chief complaint on file.   HPI:    PMH: Past Medical History:  Diagnosis Date   Acid reflux    Anxiety    Arthritis    Atrial fibrillation ([facility])    Depression    Dysrhythmia    AFIB   Gout    High cholesterol    Hypertension    Kidney failure    PTSD (post-traumatic stress disorder)     Surgical History: Past Surgical History:  Procedure Laterality Date   ATRIAL FIBRILLATION ABLATION N/A 10/03/2020   Procedure: ATRIAL FIBRILLATION ABLATION;  Surgeon: Constance Haw, MD;  Location: [facility];  Service: Cardiovascular;  Laterality: N/A;   COLONOSCOPY WITH PROPOFOL N/A 07/24/2021   Procedure: COLONOSCOPY WITH PROPOFOL;  Surgeon: Harvel Quale, MD;  [facility];  Service: Gastroenterology;  Laterality: N/A;  [place]  08/07/2020   Procedure: CYSTOSCOPY, INSERTION OF FOLEY CATHETER;  Surgeon: Cleon Gustin, MD;  Location: [facility];  Service: Urology;;   ESOPHAGOGASTRODUODENOSCOPY (EGD) WITH PROPOFOL N/A 07/24/2021   Procedure: ESOPHAGOGASTRODUODENOSCOPY (EGD) WITH PROPOFOL;  Surgeon: Harvel Quale, MD;  [facility];  Service: Gastroenterology;  Laterality: N/A;   KIDNEY TRANSPLANT Right 2013   POLYPECTOMY  07/24/2021   Procedure: POLYPECTOMY INTESTINAL;  Surgeon: Harvel Quale, MD;  [facility];  Service: Gastroenterology;;   REPLACEMENT TOTAL KNEE Right    SKIN CANCER EXCISION     TOTAL SHOULDER REPLACEMENT Left     Home Medications:  Allergies as of 11/27/2021       Reactions   Doxycycline Hyclate Hives   Simvastatin Other (See Comments)   Pt states he loss all use of muscles and he was in the hospital for 10 days    Sirolimus Other (See Comments)   Muscle pain        Medication List        Accurate as of November 27, 2021 10:42 AM. If you have any questions, ask your nurse or doctor.          acetaminophen 500 MG tablet Commonly known as: TYLENOL Take 1,000 mg by mouth every 6 (six) hours as needed for mild pain or moderate pain (Back pain).   allopurinol 100 MG tablet Commonly known as: ZYLOPRIM Take 0.5 tablets (50 mg total) by mouth daily.   cholecalciferol 25 MCG (1000 UNIT) tablet Commonly known as: VITAMIN D3 Take 1,000 Units by mouth in the morning and at bedtime.   colchicine 0.6 MG tablet Take 2 tablets today, then 1 tablet 1 hour later. Take 1 tablet once daily from tomorrow until swelling/pain resolves.   cycloSPORINE modified 25 MG capsule Commonly known as: NEORAL Take 75 mg by mouth 2 (two) times daily.   diltiazem 240 MG 24 hr capsule Commonly known as: DILACOR XR Take 240 mg by mouth daily.   escitalopram 10 MG tablet Commonly known as: Lexapro Take 1 tablet (10 mg total) by mouth daily.   ezetimibe 10 MG tablet Commonly known as: ZETIA Take 1 tablet (10 mg total) by mouth daily.   Fish Oil 1200 MG Caps Take 2,400 mg by mouth in the morning and at bedtime.   mycophenolate 180 MG EC tablet Commonly known as: MYFORTIC Take 180 mg by mouth 2 (two) times daily.   omeprazole 40  MG capsule Commonly known as: PRILOSEC Take 40 mg by mouth daily.   polyethylene glycol powder 17 GM/SCOOP powder Commonly known as: GLYCOLAX/MIRALAX Take 255 g by mouth daily.   Rivaroxaban 15 MG Tabs tablet Commonly known as: XARELTO Take 15 mg by mouth daily with breakfast.   sodium bicarbonate 650 MG tablet Take 650 mg by mouth 2 (two) times daily.   tamsulosin 0.4 MG Caps capsule Commonly known as: FLOMAX Take 1 capsule (0.4 mg total) by mouth daily.        Allergies:  Allergies  Allergen Reactions   Doxycycline Hyclate Hives   Simvastatin Other (See Comments)    Pt states he loss all use of muscles and he was in the hospital for 10 days    Sirolimus Other (See  Comments)    Muscle pain    Family History: Family History  Problem Relation Age of Onset   Alzheimer's disease Mother    Diabetes Mother     Social History:  reports that he has never smoked. He has been exposed to tobacco smoke. He has never used smokeless tobacco. He reports that he does not currently use alcohol. He reports that he does not use drugs.  ROS: All other review of systems were reviewed and are negative except what is noted above in HPI  Physical Exam: BP 121/64   Pulse 71   Constitutional:  Alert and oriented, No acute distress. HEENT: [place] AT, moist mucus membranes.  Trachea midline, no masses. Cardiovascular: No clubbing, cyanosis, or edema. Respiratory: Normal respiratory effort, no increased work of breathing. GI: Abdomen is soft, nontender, nondistended, no abdominal masses GU: No CVA tenderness.  Lymph: No cervical or inguinal lymphadenopathy. Skin: No rashes, bruises or suspicious lesions. Neurologic: Grossly intact, no focal deficits, moving all 4 extremities. Psychiatric: Normal mood and affect.  Laboratory Data: Lab Results  Component Value Date   WBC 7.5 05/06/2021   HGB 13.9 05/06/2021   HCT 41.0 05/06/2021   MCV 92.6 05/06/2021   PLT 194 05/06/2021    Lab Results  Component Value Date   CREATININE 1.30 (H) 05/06/2021    No results found for: "PSA"  No results found for: "TESTOSTERONE"  No results found for: "HGBA1C"  Urinalysis    Component Value Date/Time   COLORURINE YELLOW 05/06/2021 2238   APPEARANCEUR Clear 06/01/2021 1120   LABSPEC 1.010 05/06/2021 2238   PHURINE 7.0 05/06/2021 2238   GLUCOSEU Negative 06/01/2021 1120   HGBUR TRACE (A) 05/06/2021 2238   BILIRUBINUR Negative 06/01/2021 1120   KETONESUR NEGATIVE 05/06/2021 2238   PROTEINUR 1+ (A) 06/01/2021 1120   PROTEINUR NEGATIVE 05/06/2021 2238   NITRITE Negative 06/01/2021 1120   NITRITE NEGATIVE 05/06/2021 2238   LEUKOCYTESUR Negative 06/01/2021 1120    LEUKOCYTESUR NEGATIVE 05/06/2021 2238    Lab Results  Component Value Date   LABMICR See below: 06/01/2021   WBCUA None seen 06/01/2021   LABEPIT 0-10 06/01/2021   MUCUS Present 06/01/2021   BACTERIA None seen 06/01/2021    Pertinent Imaging: *** No results found for this or any previous visit.  No results found for this or any previous visit.  No results found for this or any previous visit.  No results found for this or any previous visit.  Results for orders placed during the hospital encounter of 08/06/20  US RENAL  Narrative CLINICAL DATA:  Hydronephrosis of transplant kidney. Ureteral orifice could not be cannulated for retrograde stent placement cystoscopically.  EXAM: RENAL / URINARY TRACT  ULTRASOUND COMPLETE  COMPARISON:  07/28/2020 CT  FINDINGS: Transplant kidney:  Location: Right iliac fossa. Renal measurements: 11.5 x 7.8 x 5.4 = volume: 254 mL. Echogenicity within normal limits. No mass visualized. There is moderate hydronephrosis.  Right Kidney:  Renal measurements: 9 x 4.8 x 4.4 = volume: 97 mL. Echogenic renal parenchyma. No hydronephrosis. 2 subcentimeter cystic appearing lesions in the lower pole.  Left Kidney:  Renal measurements: 9.7 x 7.3 x 6.6 = volume: 244 mL. Echogenic parenchyma. No hydronephrosis. 6.4 cm cystic lesion in the lower pole.  Bladder:  Decompressed by Foley catheter.  Other:  None.  IMPRESSION: 1. Moderate hydronephrosis of the transplant kidney in the right iliac fossa. 2. Bilateral native renal atrophy with cystic lesions, no hydronephrosis.   Electronically Signed By: Lucrezia Europe M.D. On: 08/08/2020 09:51  No results found for this or any previous visit.  No results found for this or any previous visit.  Results for orders placed during the hospital encounter of 07/28/20  CT Renal Stone Study  Narrative CLINICAL DATA:  Flank pain, kidney stone suspected  Patient reports he is unable to void.  Renal transplant 2013. Right lower quadrant and left upper abdominal pain today.  EXAM: CT ABDOMEN AND PELVIS WITHOUT CONTRAST  TECHNIQUE: Multidetector CT imaging of the abdomen and pelvis was performed following the standard protocol without IV contrast.  COMPARISON:  Renal transplant ultrasound 11/26/2020  FINDINGS: Lower chest: Upper normal heart size. There are coronary artery calcifications. No pleural fluid or acute airspace disease.  Hepatobiliary: Borderline hepatic steatosis. No discrete focal hepatic lesion on noncontrast exam. Decompressed gallbladder. No calcified gallstone. No biliary dilatation.  Pancreas: No ductal dilatation or inflammation.  Spleen: Normal in size without focal abnormality.  Adrenals/Urinary Tract: Normal adrenal glands. Marked bilateral native renal atrophy. Low-density lesions arising from both native kidneys measure or simple fluid density and are consistent with cysts. Largest arises from the posterior mid left kidney measures 6.5 cm. No hydronephrosis or ureteral dilatation.  Right lower quadrant renal transplant. Mild transplant hydronephrosis but no ureteral dilatation. Minor transplant perinephric edema. No transplant calculi. Possible cyst in the lower pole of the renal transplant, but not well assessed on this noncontrast exam. The urinary bladder is partially distended, no bladder wall thickening or stone.  Stomach/Bowel: Unremarkable stomach. Small bowel primarily decompressed. No obstruction or inflammation. Normal appendix. Moderate colonic stool burden. No colonic wall thickening or inflammation. Occasional left colonic diverticula without diverticulitis.  Vascular/Lymphatic: Moderate aortic and branch atherosclerosis. No aortic aneurysm no abdominopelvic adenopathy.  Reproductive: Enlarged spanning 5.5 cm transverse.  Other: Postsurgical change of the anterior abdominal wall. Tiny fat containing umbilical hernia.  Minimal fat in both inguinal canals. No ascites, free air, or abdominopelvic fluid collection.  Musculoskeletal: Degenerative disc disease at L4-L5 and L5-S1. There are no acute or suspicious osseous abnormalities.  IMPRESSION: 1. Right lower quadrant renal transplant. Mild transplant hydronephrosis but no urolithiasis or cause for obstruction. Minor transplant perinephric edema. The degree of hydronephrosis appears improved from ultrasound earlier this year. 2. Marked bilateral native renal atrophy with bilateral renal cysts. 3. Enlarged prostate spanning 5.5 cm.  Aortic Atherosclerosis (ICD10-I70.0).   Electronically Signed By: Keith Rake M.D. On: 07/28/2020 15:58   Assessment & Plan:    1. Hydronephrosis with ureteral stricture, not elsewhere classified -hydronephrosis stable on renal US  - Urinalysis, Routine w reflex microscopic  2. Acute cystitis -Urine for culture -bactrim DS BID for 7 days   No follow-ups on file.  Nicolette Bang, MD  [facility] [place]

## 2021-11-28 IMAGING — CT CT HEART MORPH/PULM VEIN W/ CM & W/O CA SCORE
1 series · 2 of 4 positions shown, 3 images · non-contrast
Comparison: None.
COMPARISON: None.

Addendum:
EXAM:
OVER-READ INTERPRETATION  CT CHEST

The following report is an over-read performed by radiologist Dr.
Karely Gust [[email or web address]] on 09/25/2020. This
over-read does not include interpretation of cardiac or coronary
anatomy or pathology. The coronary calcium score/coronary CTA
interpretation by the cardiologist is attached.
CLINICAL DATA: Atrial fibrillation scheduled for an ablation.
Cardiac CT/CTA
TECHNIQUE: A 120 kV prospective scan was triggered in the descending thoracic
aorta at 111 HU's. Gantry rotation speed was 280 msecs and
collimation was .9 mm. No beta blockade and no NTG was given. The 3D
data set was reconstructed in 5% intervals of the R-R cycle. Phases
were analyzed on a dedicated work station using MPR, MIP and VRT
modes. The patient received 80 cc of contrast.

[Series 326: pulmonary veins · 0.17mm/px · 2 of 4 slices shown, 3 images]
[im 2/4  vessel]
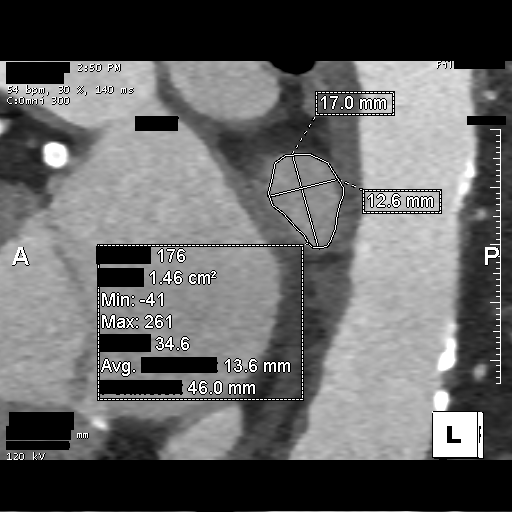
[im 2/4  lung]
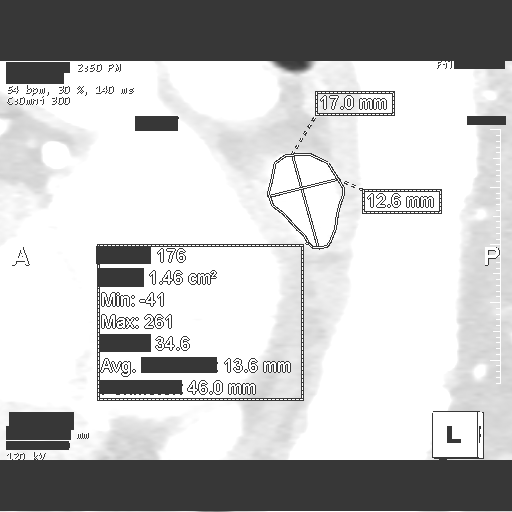
[im 3/4  vessel]
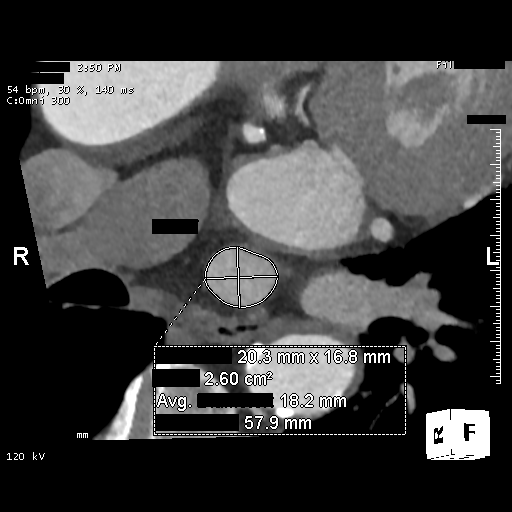

[2 of 4 positions shown; findings below may reference images not displayed]

FINDINGS: Atherosclerotic calcifications throughout the thoracic aorta.
Multiple small calcified granulomas are noted throughout the lungs
bilaterally. Within the visualized portions of the thorax there are
no other suspicious appearing pulmonary nodules or masses, there is
no acute consolidative airspace disease, no pleural effusions, no
pneumothorax and no lymphadenopathy. Visualized portions of the
upper abdomen are unremarkable. There are no aggressive appearing
lytic or blastic lesions noted in the visualized portions of the
skeleton.
IMPRESSION: 1.  Aortic Atherosclerosis (DDGN9-XTS.S).
2. Old granulomatous disease.
FINDINGS: Left atrium: Normal in size. No thrombus in the left atrium or left
atrial appendage.

Left ventricle: Normal in size.

Right atrium: Mild enlargement

Right ventricle: Mild dilatation

Pericardium: Normal thickness

Pulmonary veins:

Left superior pulmonary vein: Measures 2.0cm x 1.7cm in diameter
with area 2.6cm^2.

Left inferior pulmonary vein: Measures 1.7cm x 1.3cm in diameter
with area 1.5cm^2.

Right superior pulmonary vein: Measures 2.1cm x 1.9cm in diameter
with area 3.0cm^2.

Right inferior pulmonary vein: Measures 1.8cm x 1.7cm in diameter
with area 2.5cm^2.

Pulmonary arteries: Normal size

Coronary Arteries: Normal coronary origin. Right dominance. The
study was performed without use of NTG and insufficient for plaque
evaluation. Coronary calcium score 1185

Esophagus: Courses posterior to ostium of left sided pulmonary veins
IMPRESSION: 1. There is normal pulmonary vein drainage into the left atrium.
Measurements as reported

2. There is no thrombus in the left atrial appendage.

3. Esophagus courses posterior to ostium of left sided pulmonary
veins

4. Coronary calcium score 1185 (97th percentile). The study was
performed without use of nitroglycerin and insufficient for plaque
evaluation. Cannot exclude obstructive CAD, would consider stress
testing for further evaluation given markedly elevated calcium score

*** End of Addendum ***
EXAM:
OVER-READ INTERPRETATION  CT CHEST

The following report is an over-read performed by radiologist Dr.
Karely Gust [[email or web address]] on 09/25/2020. This
over-read does not include interpretation of cardiac or coronary
anatomy or pathology. The coronary calcium score/coronary CTA
interpretation by the cardiologist is attached.
FINDINGS: Atherosclerotic calcifications throughout the thoracic aorta.
Multiple small calcified granulomas are noted throughout the lungs
bilaterally. Within the visualized portions of the thorax there are
no other suspicious appearing pulmonary nodules or masses, there is
no acute consolidative airspace disease, no pleural effusions, no
pneumothorax and no lymphadenopathy. Visualized portions of the
upper abdomen are unremarkable. There are no aggressive appearing
lytic or blastic lesions noted in the visualized portions of the
skeleton.
IMPRESSION: 1.  Aortic Atherosclerosis (DDGN9-XTS.S).
2. Old granulomatous disease.

## 2021-11-29 LAB — URINE CULTURE

## 2021-12-03 ENCOUNTER — Encounter: Payer: Self-pay | Admitting: Urology

## 2021-12-03 NOTE — Patient Instructions (Signed)
Hydronephrosis  Hydronephrosis is the swelling of one or both kidneys due to a blockage that stops urine from flowing out of the body. Kidneys filter waste from the blood and produce urine. This condition can lead to kidney failure and may become life-threatening if not treated promptly. What are the causes? In infants and children, common causes include problems that occur when a baby is developing in the womb. These can include problems in the kidneys or in the tubes that drain urine into the bladder (ureters). In adults, common causes include: Kidney stones. Pregnancy. A tumor or cyst in the abdomen or pelvis. An enlarged prostate gland. Other causes include: Bladder infection. Scar tissue from a previous surgery or injury. A blood clot. Cancer of the prostate, bladder, uterus, ovary, or colon. What are the signs or symptoms? Symptoms of this condition include: Pain or discomfort in your side (flank) or abdomen. Swelling in your abdomen. Nausea and vomiting. Fever. Pain when passing urine. Feelings of urgency when you need to urinate. Urinating more often than normal. In some cases, you may not have any symptoms. How is this diagnosed? This condition may be diagnosed based on: Your symptoms and medical history. A physical exam. Blood and urine tests. Imaging tests, such as an ultrasound, CT scan, or MRI. A procedure to look at your urinary tract and bladder by inserting a scope into the urethra (cystoscopy). How is this treated? Treatment for this condition depends on where the blockage is, how long it has been there, and what caused it. The goal of treatment is to remove the blockage. Treatment may include: Antibiotic medicines to treat or prevent infection. A procedure to place a small, thin tube (stent) into a blocked ureter. The stent will keep the ureter open so that urine can drain through it. A nonsurgical procedure that crushes kidney stones with shock waves  (extracorporeal shock wave lithotripsy). If kidney failure occurs, treatment may include dialysis or a kidney transplant. Follow these instructions at home:  Take over-the-counter and prescription medicines only as told by your health care provider. If you were prescribed an antibiotic medicine, take it exactly as told by your health care provider. Do not stop taking the antibiotic even if you start to feel better. Rest and return to your normal activities as told by your health care provider. Ask your health care provider what activities are safe for you. Drink enough fluid to keep your urine pale yellow. Keep all follow-up visits. This is important. Contact a health care provider if: You continue to have symptoms after treatment. You develop new symptoms. Your urine becomes cloudy or bloody. You have a fever. Get help right away if: You have severe flank or abdominal pain. You cannot drink fluids without vomiting. Summary Hydronephrosis is the swelling of one or both kidneys due to a blockage that stops urine from flowing out of the body. Hydronephrosis can lead to kidney failure and may become life-threatening if not treated promptly. The goal of treatment is to remove the blockage. It may include a procedure to insert a stent into a blocked ureter, a procedure to break up kidney stones, or taking antibiotic medicines. Follow your health care provider's instructions for taking care of yourself at home, including instructions about drinking fluids, taking medicines, and limiting activities. This information is not intended to replace advice given to you by your health care provider. Make sure you discuss any questions you have with your health care provider. Document Revised: 07/10/2019 Document Reviewed: 07/10/2019 Elsevier   Patient Education  2023 Elsevier Inc.  

## 2021-12-14 ENCOUNTER — Ambulatory Visit (INDEPENDENT_AMBULATORY_CARE_PROVIDER_SITE_OTHER): Payer: Medicare Other | Admitting: Internal Medicine

## 2021-12-14 ENCOUNTER — Encounter: Payer: Self-pay | Admitting: *Deleted

## 2021-12-14 ENCOUNTER — Encounter: Payer: Self-pay | Admitting: Internal Medicine

## 2021-12-14 VITALS — BP 136/62 | HR 61 | Resp 18 | Ht 65.0 in | Wt 163.0 lb

## 2021-12-14 DIAGNOSIS — Z23 Encounter for immunization: Secondary | ICD-10-CM

## 2021-12-14 DIAGNOSIS — N1832 Chronic kidney disease, stage 3b: Secondary | ICD-10-CM

## 2021-12-14 DIAGNOSIS — I10 Essential (primary) hypertension: Secondary | ICD-10-CM

## 2021-12-14 DIAGNOSIS — G43719 Chronic migraine without aura, intractable, without status migrainosus: Secondary | ICD-10-CM

## 2021-12-14 DIAGNOSIS — N1831 Chronic kidney disease, stage 3a: Secondary | ICD-10-CM

## 2021-12-14 DIAGNOSIS — Z940 Kidney transplant status: Secondary | ICD-10-CM

## 2021-12-14 DIAGNOSIS — E1122 Type 2 diabetes mellitus with diabetic chronic kidney disease: Secondary | ICD-10-CM | POA: Diagnosis not present

## 2021-12-14 DIAGNOSIS — I480 Paroxysmal atrial fibrillation: Secondary | ICD-10-CM

## 2021-12-14 DIAGNOSIS — M545 Low back pain, unspecified: Secondary | ICD-10-CM | POA: Insufficient documentation

## 2021-12-14 DIAGNOSIS — K219 Gastro-esophageal reflux disease without esophagitis: Secondary | ICD-10-CM | POA: Insufficient documentation

## 2021-12-14 NOTE — Assessment & Plan Note (Signed)
BP Readings from Last 1 Encounters:  12/14/21 136/62   Well-controlled with Diltiazem (mainly for A Fib) Counseled for compliance with the medications Advised DASH diet and moderate exercise/walking, at least 150 mins/week

## 2021-12-14 NOTE — Progress Notes (Signed)
Established Patient Office Visit  Subjective:  Patient ID: Cristian Henderson, male    DOB: [birth year]  Age: 76 y.o. MRN: [id number]  CC:  Chief Complaint  Patient presents with   Follow-up    4 month follow up     HPI Cristian Henderson is a 76 y.o. male with past medical history of kidney transplant, paroxysmal A Fib, HTN, HLD, gout, PTSD and BPH who presents for f/u of his chronic medical conditions.  Migraine: He has had episodes of headache on a daily basis, which is dull and generalized, better with Tylenol.  He is going to see [place] neurology. He had CT of the head, MRI of the brain and lumbar puncture, which was negative for acute subarachnoid hemorrhage.  He also saw Neurology in the outpatient setting, and was told of migraine. He went to [place] clinic, and had CT of the neck, which showed vertebral artery occlusion, which was deemed to be appropriate for medical management.  He is already on Xarelto for A-fib currently.  HTN and A Fib: BP well-controlled. On Diltiazem only currently.  Takes Xarelto for [facility].  Denies any chest pain or dyspnea.  Type II DM: His last HbA1C was 6.7 in 08/23 at [facility] clinic. It has been stable with diet alone.  He denies any polyuria or polyphagia.  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 Theador Hawthorne.  His kidney function test have been stable, reviewed from care everywhere.  Denies any dysuria, hematuria or urinary hesitancy or resistance.  He follows up with urology for BPH and hydronephrosis.  Gout: Takes allopurinol.  Denies any recent gout flareup since last visit.   Past Medical History:  Diagnosis Date   Acid reflux    Anxiety    Arthritis    Atrial fibrillation ([place])    Depression    Dysrhythmia    AFIB   Gout    High cholesterol    Hydronephrosis with ureteral stricture, not elsewhere classified 08/07/2020   Hypertension    Kidney failure    PTSD (post-traumatic stress disorder)     Past Surgical History:  Procedure Laterality  Date   ATRIAL FIBRILLATION ABLATION N/A 10/03/2020   Procedure: ATRIAL FIBRILLATION ABLATION;  Surgeon: Constance Haw, MD;  Location: [place] [facility];  Service: Cardiovascular;  Laterality: N/A;   COLONOSCOPY WITH PROPOFOL N/A 07/24/2021   Procedure: COLONOSCOPY WITH PROPOFOL;  Surgeon: Harvel Quale, MD;  Location[facility];  Service: Gastroenterology;  Laterality: N/A;  [place]  08/07/2020   Procedure: CYSTOSCOPY, INSERTION OF FOLEY CATHETER;  Surgeon: Cleon Gustin, MD;  Location: [facility];  Service: Urology;;   ESOPHAGOGASTRODUODENOSCOPY (EGD) WITH PROPOFOL N/A 07/24/2021   Procedure: ESOPHAGOGASTRODUODENOSCOPY (EGD) WITH PROPOFOL;  Surgeon: Harvel Quale, MD;  Location[facility];  Service: Gastroenterology;  Laterality: N/A;   KIDNEY TRANSPLANT Right 2013   POLYPECTOMY  07/24/2021   Procedure: POLYPECTOMY INTESTINAL;  Surgeon: Harvel Quale, MD;  Location[facility];  Service: Gastroenterology;;   REPLACEMENT TOTAL KNEE Right    SKIN CANCER EXCISION     TOTAL SHOULDER REPLACEMENT Left     Family History  Problem Relation Age of Onset   Alzheimer's disease Mother    Diabetes Mother     Social History   Socioeconomic History   Marital status: Married    Spouse name: Cristian Henderson   Number of children: 3   Years of education: 12   Highest education level: Futures trader  degree: academic program  Occupational History   Occupation: retired  Tobacco Use   Smoking status: Never    Passive exposure: Past   Smokeless tobacco: Never  Vaping Use   Vaping Use: Never used  Substance and Sexual Activity   Alcohol use: Not Currently   Drug use: Never   Sexual activity: Yes  Other Topics Concern   Not on file  Social History Narrative   Not on file   Social Determinants of Health   Financial Resource Strain: Low Risk  (05/05/2021)   Overall Financial Resource Strain (CARDIA)    Difficulty of Paying Living Expenses: Not hard  at all  Food Insecurity: No Food Insecurity (05/05/2021)   Hunger Vital Sign    Worried About Running Out of Food in the Last Year: Never true    [place] in the Last Year: Never true  Transportation Needs: No Transportation Needs (05/05/2021)   PRAPARE - Hydrologist (Medical): No    Lack of Transportation (Non-Medical): No  Physical Activity: Unknown (05/05/2021)   Exercise Vital Sign    Days of Exercise per Week: 3 days    Minutes of Exercise per Session: Not on file  Stress: No Stress Concern Present (05/02/2020)   [place]    Feeling of Stress : Not at all  Social Connections: Moderately Integrated (05/05/2021)   Social Connection and Isolation Panel [NHANES]    Frequency of Communication with Friends and Family: More than three times a week    Frequency of Social Gatherings with Friends and Family: Twice a week    Attends Religious Services: More than 4 times per year    Active Member of [employer] or Organizations: No    Attends Archivist Meetings: Never    Marital Status: Married  Human resources officer Violence: Not At Risk (05/05/2021)   Humiliation, Afraid, Rape, and Kick questionnaire    Fear of Current or Ex-Partner: No    Emotionally Abused: No    Physically Abused: No    Sexually Abused: No    Outpatient Medications Prior to Visit  Medication Sig Dispense Refill   acetaminophen (TYLENOL) 500 MG tablet Take 1,000 mg by mouth every 6 (six) hours as needed for mild pain or moderate pain (Back pain).     allopurinol (ZYLOPRIM) 100 MG tablet Take 0.5 tablets (50 mg total) by mouth daily. 30 tablet 11   cholecalciferol (VITAMIN D3) 25 MCG (1000 UNIT) tablet Take 1,000 Units by mouth in the morning and at bedtime.     colchicine 0.6 MG tablet Take 2 tablets today, then 1 tablet 1 hour later. Take 1 tablet once daily from tomorrow until swelling/pain resolves. 20 tablet 0    cycloSPORINE modified (NEORAL) 25 MG capsule Take 75 mg by mouth 2 (two) times daily.     diltiazem (DILACOR XR) 240 MG 24 hr capsule Take 240 mg by mouth daily.     escitalopram (LEXAPRO) 10 MG tablet Take 1 tablet (10 mg total) by mouth daily. 30 tablet 3   ezetimibe (ZETIA) 10 MG tablet Take 1 tablet (10 mg total) by mouth daily. 90 tablet 1   mycophenolate (MYFORTIC) 180 MG EC tablet Take 180 mg by mouth 2 (two) times daily.     Omega-3 Fatty Acids (FISH OIL) 1200 MG CAPS Take 2,400 mg by mouth in the morning and at bedtime.     omeprazole (PRILOSEC) 40 MG capsule  Take 40 mg by mouth daily.     polyethylene glycol powder (GLYCOLAX/MIRALAX) 17 GM/SCOOP powder Take 255 g by mouth daily. 255 g 3   Rivaroxaban (XARELTO) 15 MG TABS tablet Take 15 mg by mouth daily with breakfast.     sodium bicarbonate 650 MG tablet Take 650 mg by mouth 2 (two) times daily.     sulfamethoxazole-trimethoprim (BACTRIM DS) 800-160 MG tablet Take 1 tablet by mouth every 12 (twelve) hours. 14 tablet 0   tamsulosin (FLOMAX) 0.4 MG CAPS capsule Take 1 capsule (0.4 mg total) by mouth daily. 30 capsule 11   No facility-administered medications prior to visit.    Allergies  Allergen Reactions   Doxycycline Hyclate Hives   Simvastatin Other (See Comments)    Pt states he loss all use of muscles and he was in the hospital for 10 days    Sirolimus Other (See Comments)    Muscle pain    ROS Review of Systems  Constitutional:  Negative for chills and fever.  HENT:  Negative for congestion and sore throat.   Eyes:  Negative for pain and discharge.  Respiratory:  Negative for cough and shortness of breath.   Cardiovascular:  Positive for palpitations (Intermittent). Negative for chest pain.  Gastrointestinal:  Negative for constipation, diarrhea, nausea and vomiting.  Endocrine: Negative for polydipsia and polyuria.  Genitourinary:  Negative for dysuria and hematuria.  Musculoskeletal:  Negative for neck pain and  neck stiffness.  Skin:  Negative for rash.  Neurological:  Positive for headaches. Negative for dizziness, weakness and numbness.  Psychiatric/Behavioral:  Negative for agitation and behavioral problems.       Objective:    Physical Exam Vitals reviewed.  Constitutional:      General: He is not in acute distress.    Appearance: He is not diaphoretic.  HENT:     Head: Normocephalic and atraumatic.     Nose: Nose normal.     Mouth/Throat:     Mouth: Mucous membranes are moist.  Eyes:     General: No scleral icterus.    Extraocular Movements: Extraocular movements intact.  Cardiovascular:     Rate and Rhythm: Normal rate and regular rhythm.     Pulses: Normal pulses.     Heart sounds: Normal heart sounds. No murmur heard. Pulmonary:     Breath sounds: Normal breath sounds. No wheezing or rales.  Musculoskeletal:     Cervical back: Neck supple. No tenderness.     Right lower leg: No edema.     Left lower leg: No edema.  Skin:    General: Skin is warm.     Findings: No rash.  Neurological:     General: No focal deficit present.     Mental Status: He is alert and oriented to person, place, and time.     Sensory: No sensory deficit.     Motor: No weakness.  Psychiatric:        Mood and Affect: Mood normal.        Behavior: Behavior normal.     BP 136/62 (BP Location: Right Arm, Patient Position: Sitting, Cuff Size: Normal)   Pulse 61   Resp 18   Ht $R'5\' 5"'Ia$  (1.651 m)   Wt 163 lb (73.9 kg)   SpO2 97%   BMI 27.12 kg/m  Wt Readings from Last 3 Encounters:  12/14/21 163 lb (73.9 kg)  08/13/21 165 lb (74.8 kg)  07/24/21 165 lb (74.8 kg)    No results found  for: "TSH" Lab Results  Component Value Date   WBC 7.5 05/06/2021   HGB 13.9 05/06/2021   HCT 41.0 05/06/2021   MCV 92.6 05/06/2021   PLT 194 05/06/2021   Lab Results  Component Value Date   NA 138 05/06/2021   K 4.3 05/06/2021   CO2 22 05/06/2021   GLUCOSE 138 (H) 05/06/2021   BUN 34 (H) 05/06/2021    CREATININE 1.30 (H) 05/06/2021   BILITOT 0.7 05/06/2021   ALKPHOS 77 05/06/2021   AST 15 05/06/2021   ALT 15 05/06/2021   PROT 6.6 05/06/2021   ALBUMIN 3.5 05/06/2021   CALCIUM 9.4 05/06/2021   ANIONGAP 10 05/06/2021   EGFR 49 (L) 09/24/2020   No results found for: "CHOL" No results found for: "HDL" No results found for: "LDLCALC" No results found for: "TRIG" No results found for: "CHOLHDL" Lab Results  Component Value Date   HGBA1C 6.7 11/19/2021      Assessment & Plan:   Problem List Items Addressed This Visit       Cardiovascular and Mediastinum   Paroxysmal atrial fibrillation ([place])    Rate-controlled with Diltiazem On Xarelto for [facility] S/p cardiac ablation in 07/22      Essential (primary) hypertension    BP Readings from Last 1 Encounters:  12/14/21 136/62  Well-controlled with Diltiazem (mainly for A Fib) Counseled for compliance with the medications Advised DASH diet and moderate exercise/walking, at least 150 mins/week      Intractable chronic migraine without aura and without status migrainosus    Headache deemed to be migraine Tylenol as needed Follow up with [facility] Neurology Would avoid adding triptan as he has h/o A Fib and vertebral artery occlusion        Endocrine   Type 2 diabetes mellitus with stage 3a chronic kidney disease, without long-term current use of insulin ([place]) - Primary    Lab Results  Component Value Date   HGBA1C 6.7 11/19/2021  Blood tests from [place] clinic reviewed Diet controlled Advised to follow diabetic diet Plan to start Farxiga, will discuss with Dr. Theador Hawthorne F/u CMP and lipid panel Diabetic foot exam: Today Diabetic eye exam: Advised to follow up with Ophthalmology for diabetic eye exam        Genitourinary   CKD (chronic kidney disease), stage III ([place])    S/p kidney transplant for IgA nephrology On Cyclosporine, Mycophenolate On Sodium bicarb F/u with Nephrology        Other   Kidney transplant status    On  Cyclosporine, Mycophenolate On Sodium bicarb Followed by Nephrology      Other Visit Diagnoses     Need for immunization against influenza       Relevant Orders   Flu Vaccine QUAD High Dose(Fluad) (Completed)       No orders of the defined types were placed in this encounter.   Follow-up: Return in about 6 months (around 06/14/2022) for DM and A Fib.    [name], MD

## 2021-12-14 NOTE — Assessment & Plan Note (Signed)
Headache deemed to be migraine Tylenol as needed Follow up with [facility] Neurology Would avoid adding triptan as he has h/o A Fib and vertebral artery occlusion

## 2021-12-14 NOTE — Assessment & Plan Note (Signed)
S/p kidney transplant for IgA nephrology On Cyclosporine, Mycophenolate On Sodium bicarb F/u with Nephrology

## 2021-12-14 NOTE — Patient Instructions (Signed)
Please continue taking medications as prescribed.  Please follow low carb and low salt diet and ambulate as tolerated.  Please discuss about [place] to Dr Theador Hawthorne.

## 2021-12-14 NOTE — Progress Notes (Signed)
follo

## 2021-12-14 NOTE — Assessment & Plan Note (Addendum)
Lab Results  Component Value Date   HGBA1C 6.7 11/19/2021   Blood tests from [facility] clinic reviewed Diet controlled Advised to follow diabetic diet Plan to start Farxiga, will discuss with Dr. Theador Hawthorne F/u CMP and lipid panel Diabetic foot exam: Today Diabetic eye exam: Advised to follow up with Ophthalmology for diabetic eye exam

## 2021-12-14 NOTE — Assessment & Plan Note (Signed)
Rate-controlled with Diltiazem On Xarelto for [facility] S/p cardiac ablation in 07/22

## 2021-12-14 NOTE — Assessment & Plan Note (Signed)
On Cyclosporine, Mycophenolate On Sodium bicarb Followed by Nephrology

## 2022-03-22 NOTE — Progress Notes (Signed)
Primary Care Physician: [name], MD Primary Cardiologist: Dr Johnsie Cancel Primary Electrophysiologist: Dr Curt Bears  76 y.o. with history of HTN, Gout and PTSD post renal transplant 2013 Moved from [place] 2021 History of PAF/bradycardia particularly in setting of high K and renal failure Intolerant to statins on Zetia    Ex myovue: 09/24/18 normal EF 67% Monitor 07/04/20 with frequent PAF SSS/Bradycardia  Echo: 06/05/20 EF 50-55% mild/mod AR   He was admitted to the hospital June 2020 with heart failure and new onset atrial fibrillation.  He converted to sinus rhythm with Cardizem.  He was readmitted 01/12/2019 with junctional rhythm and hyperkalemia as well as worsening renal dysfunction. Patient is on Xarelto for a CHADS2VASC score of 4.   On follow up today, patient is s/p afib ablation with Dr Curt Bears on 10/03/20. Patient reports that he has done well since the procedure.   Remarried x 7 years Daughter in [place] and one in [place] Retired Development worker, international aid With 4 grand children   Seeing [facility] issues with migraines CT/MRI/ LP negative carotid duplex with occluded left vertebral nothing to intervene on   .Repeat BP by me in room 130/60 mmHg He does not feel like his BP has been high at home 2 weeks ago had one episode of tachycardia that lasted an hour and drained him    Atrial Fibrillation Risk Factors:  he does not have symptoms or diagnosis of sleep apnea. he does not have a history of rheumatic fever.   he has a BMI of Body mass index is 27.79 kg/m.Marland Kitchen Filed Weights   03/31/22 0906  Weight: 167 lb (75.8 kg)      Family History  Problem Relation Age of Onset   Alzheimer's disease Mother    Diabetes Mother      Atrial Fibrillation Management history:  Previous antiarrhythmic drugs: none Previous cardioversions: none Previous ablations: 10/03/20 CHADS2VASC score: 3 Anticoagulation history: Xarelto    Past Medical History:  Diagnosis Date   Acid reflux    Anxiety    Arthritis     Atrial fibrillation ([place])    Depression    Dysrhythmia    AFIB   Gout    High cholesterol    Hydronephrosis with ureteral stricture, not elsewhere classified 08/07/2020   Hypertension    Kidney failure    PTSD (post-traumatic stress disorder)    Past Surgical History:  Procedure Laterality Date   ATRIAL FIBRILLATION ABLATION N/A 10/03/2020   Procedure: ATRIAL FIBRILLATION ABLATION;  Surgeon: Constance Haw, MD;  Location: [place] [facility];  Service: Cardiovascular;  Laterality: N/A;   COLONOSCOPY WITH PROPOFOL N/A 07/24/2021   Procedure: COLONOSCOPY WITH PROPOFOL;  Surgeon: Harvel Quale, MD;  Location: [facility];  Service: Gastroenterology;  Laterality: N/A;  [place]  08/07/2020   Procedure: CYSTOSCOPY, INSERTION OF FOLEY CATHETER;  Surgeon: Cleon Gustin, MD;  Location: [facility];  Service: Urology;;   ESOPHAGOGASTRODUODENOSCOPY (EGD) WITH PROPOFOL N/A 07/24/2021   Procedure: ESOPHAGOGASTRODUODENOSCOPY (EGD) WITH PROPOFOL;  Surgeon: Harvel Quale, MD;  [facility]: [facility];  Service: Gastroenterology;  Laterality: N/A;   KIDNEY TRANSPLANT Right 2013   POLYPECTOMY  07/24/2021   Procedure: POLYPECTOMY INTESTINAL;  Surgeon: Harvel Quale, MD;  Location: [facility];  Service: Gastroenterology;;   REPLACEMENT TOTAL KNEE Right    SKIN CANCER EXCISION     TOTAL SHOULDER REPLACEMENT Left     Current Outpatient Medications  Medication Sig Dispense Refill   acetaminophen (  TYLENOL) 500 MG tablet Take 1,000 mg by mouth every 6 (six) hours as needed for mild pain or moderate pain (Back pain).     allopurinol (ZYLOPRIM) 100 MG tablet Take 0.5 tablets (50 mg total) by mouth daily. 30 tablet 11   cholecalciferol (VITAMIN D3) 25 MCG (1000 UNIT) tablet Take 1,000 Units by mouth in the morning and at bedtime.     colchicine 0.6 MG tablet Take 2 tablets today, then 1 tablet 1 hour later. Take 1 tablet once daily from tomorrow until  swelling/pain resolves. 20 tablet 0   cycloSPORINE modified (NEORAL) 25 MG capsule Take 75 mg by mouth 2 (two) times daily.     diltiazem (DILACOR XR) 240 MG 24 hr capsule Take 240 mg by mouth daily.     escitalopram (LEXAPRO) 10 MG tablet Take 1 tablet (10 mg total) by mouth daily. 30 tablet 3   ezetimibe (ZETIA) 10 MG tablet Take 1 tablet (10 mg total) by mouth daily. 90 tablet 1   fluorouracil (EFUDEX) 5 % cream APPLY SMALL AMOUNT TOPICALLY TWO TIMES A DAY SUN DAMAGE FOR SCALP FOR 2 WEEKS, THEN BACK OF ARMS/HANDS FOR 2 WEEKS     mycophenolate (MYFORTIC) 180 MG EC tablet Take 180 mg by mouth 2 (two) times daily.     Omega-3 Fatty Acids (FISH OIL) 1200 MG CAPS Take 2,400 mg by mouth in the morning and at bedtime.     omeprazole (PRILOSEC) 40 MG capsule Take 40 mg by mouth daily.     Rivaroxaban (XARELTO) 15 MG TABS tablet Take 15 mg by mouth daily with breakfast.     sodium bicarbonate 650 MG tablet Take 650 mg by mouth 2 (two) times daily.     tamsulosin (FLOMAX) 0.4 MG CAPS capsule Take 1 capsule (0.4 mg total) by mouth daily. 30 capsule 11   No current facility-administered medications for this visit.    Allergies  Allergen Reactions   Doxycycline Hyclate Hives   Simvastatin Other (See Comments)    Pt states he loss all use of muscles and he was in the hospital for 10 days    Sirolimus Other (See Comments)    Muscle pain    Social History   Socioeconomic History   Marital status: Married    Spouse name: Lovey Newcomer   Number of children: 3   Years of education: 12   Highest education level: Associate degree: academic program  Occupational History   Occupation: retired  Tobacco Use   Smoking status: Never    Passive exposure: Past   Smokeless tobacco: Never  Vaping Use   Vaping Use: Never used  Substance and Sexual Activity   Alcohol use: Not Currently   Drug use: Never   Sexual activity: Yes  Other Topics Concern   Not on file  Social History Narrative   Not on file    Social Determinants of Health   Financial Resource Strain: Low Risk  (05/05/2021)   Overall Financial Resource Strain (CARDIA)    Difficulty of Paying Living Expenses: Not hard at all  Food Insecurity: No Food Insecurity (05/05/2021)   Hunger Vital Sign    Worried About Running Out of Food in the Last Year: Never true    [place] in the Last Year: Never true  Transportation Needs: No Transportation Needs (05/05/2021)   PRAPARE - Hydrologist (Medical): No    Lack of Transportation (Non-Medical): No  Physical Activity: Unknown (05/05/2021)  Exercise Vital Sign    Days of Exercise per Week: 3 days    Minutes of Exercise per Session: Not on file  Stress: No Stress Concern Present (05/02/2020)   [place]    Feeling of Stress : Not at all  Social Connections: Moderately Integrated (05/05/2021)   Social Connection and Isolation Panel [NHANES]    Frequency of Communication with Friends and Family: More than three times a week    Frequency of Social Gatherings with Friends and Family: Twice a week    Attends Religious Services: More than 4 times per year    Active Member of [employer] or Organizations: No    Attends Archivist Meetings: Never    Marital Status: Married  Human resources officer Violence: Not At Risk (05/05/2021)   Humiliation, Afraid, Rape, and Kick questionnaire    Fear of Current or Ex-Partner: No    Emotionally Abused: No    Physically Abused: No    Sexually Abused: No     ROS- All systems are reviewed and negative except as per the HPI above.  Physical Exam: Vitals:   03/31/22 0906  BP: (!) 170/90  Pulse: 66  SpO2: 98%  Weight: 167 lb (75.8 kg)  Height: '5\' 5"'$  (1.651 m)     Affect appropriate Healthy:  appears stated age HEENT: normal Neck supple with no adenopathy JVP normal no bruits no thyromegaly Lungs clear with no wheezing and good diaphragmatic  motion Heart:  S1/S2 no murmur, no rub, gallop or click PMI normal Abdomen: benighn, BS positve, no tenderness, no AAA no bruit.  No HSM or HJR Distal pulses intact with no bruits No edema Neuro non-focal Skin warm and dry No muscular weakness   Wt Readings from Last 3 Encounters:  03/31/22 167 lb (75.8 kg)  12/14/21 163 lb (73.9 kg)  08/13/21 165 lb (74.8 kg)    EKG today demonstrates  SR Vent. rate 72 BPM PR interval 150 ms QRS duration 86 ms QT/QTcB 390/427 ms  Echo 06/05/20 demonstrated   1. Left ventricular ejection fraction, by estimation, is 55 to 60%. The  left ventricle has normal function. The left ventricle has no regional  wall motion abnormalities. There is mild left ventricular hypertrophy.  Left ventricular diastolic parameters  were normal.   2. Right ventricular systolic function is normal. The right ventricular  size is normal.   3. The mitral valve is normal in structure. Trivial mitral valve  regurgitation. No evidence of mitral stenosis.   4. The aortic valve is tricuspid. Aortic valve regurgitation is mild to  moderate. No aortic stenosis is present.   5. The inferior vena cava is normal in size with greater than 50%  respiratory variability, suggesting right atrial pressure of 3 mmHg.   Epic records are reviewed at length today   ASSESSMENT AND PLAN: 1. Paroxysmal Atrial Fibrillation (ICD10:  I48.0) S/p afib ablation 10/03/20 Patient appears to be maintaining SR. Continue diltiazem 240 mg daily Take extra for any episodes of prolonged palpitations  Continue Xarelto 15 mg daily renal dose adjusted  CHADVASC 4 Consider PIP flecainide and monitor if has more episodes of tachycardia   2. PTSD Previously in [employer] on Lexapro stable   3. Renal Transplant Immunosuppressed with Myfortic and cyclosporine Cr 1.5  4. Gout: Continue allopurinol   5. HLD: Continue zetia intolerant to statins   6. GERD: Low carb diet Prilosec stable   7. HTN:   repeat BP  130/60 mmHg in left arm with large cuff and shirt off Will monitor at home   F/U EP 6 months and me in a year   Jenkins Rouge MD [facility]

## 2022-03-31 ENCOUNTER — Encounter: Payer: Self-pay | Admitting: Cardiovascular Disease

## 2022-03-31 ENCOUNTER — Ambulatory Visit: Payer: Medicare Other | Attending: Cardiovascular Disease | Admitting: Cardiovascular Disease

## 2022-03-31 VITALS — BP 170/90 | HR 66 | Ht 65.0 in | Wt 167.0 lb

## 2022-03-31 DIAGNOSIS — F4310 Post-traumatic stress disorder, unspecified: Secondary | ICD-10-CM | POA: Diagnosis present

## 2022-03-31 DIAGNOSIS — T8610 Unspecified complication of kidney transplant: Secondary | ICD-10-CM | POA: Diagnosis present

## 2022-03-31 DIAGNOSIS — I10 Essential (primary) hypertension: Secondary | ICD-10-CM | POA: Diagnosis present

## 2022-03-31 DIAGNOSIS — I480 Paroxysmal atrial fibrillation: Secondary | ICD-10-CM | POA: Diagnosis present

## 2022-03-31 DIAGNOSIS — I351 Nonrheumatic aortic (valve) insufficiency: Secondary | ICD-10-CM | POA: Diagnosis present

## 2022-03-31 NOTE — Patient Instructions (Signed)
Medication Instructions:  Your physician recommends that you continue on your current medications as directed. Please refer to the Current Medication list given to you today.   Labwork: None  Testing/Procedures: None  Follow-Up: Follow up with Dr. Curt Bears in 6 months. Follow up with Dr. Johnsie Cancel in 1 year.   Any Other Special Instructions Will Be Listed Below (If Applicable).     If you need a refill on your cardiac medications before your next appointment, please call your pharmacy.

## 2022-05-06 ENCOUNTER — Encounter: Payer: Medicare Other | Admitting: Family Medicine

## 2022-05-28 ENCOUNTER — Ambulatory Visit (HOSPITAL_COMMUNITY): Payer: Medicare PPO | Attending: Urology

## 2022-06-04 ENCOUNTER — Ambulatory Visit: Payer: Medicare Other | Admitting: Urology

## 2022-06-16 ENCOUNTER — Ambulatory Visit: Payer: Medicare Other | Admitting: Internal Medicine

## 2022-07-28 IMAGING — US US RENAL TRANSPLANT
1 series · 13 of 25 positions shown · non-contrast
Comparison: 11/19/2020

CLINICAL DATA: Transplant hydronephrosis

EXAM:
ULTRASOUND OF RENAL TRANSPLANT WITH RENAL DOPPLER ULTRASOUND
TECHNIQUE: Ultrasound examination of the renal transplant was performed with
gray-scale, color and duplex doppler evaluation.

[Series 1: us renal transplant · 0.25mm/px · 13 of 69 slices shown]
[im 1/69]
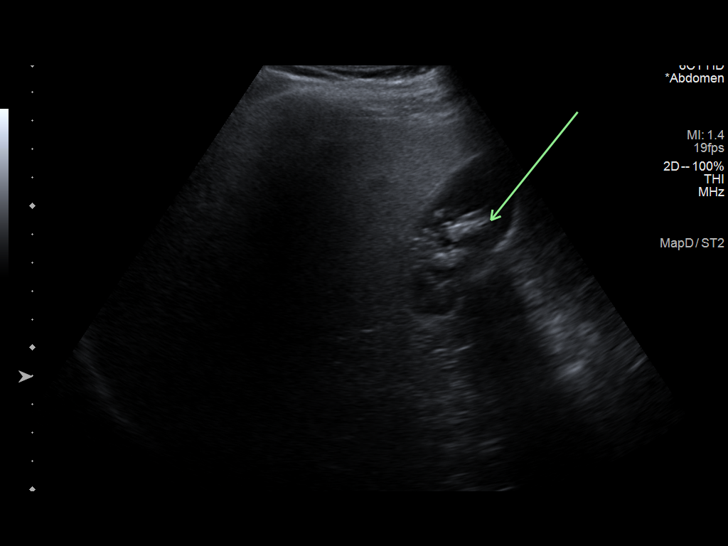
[im 6/69]
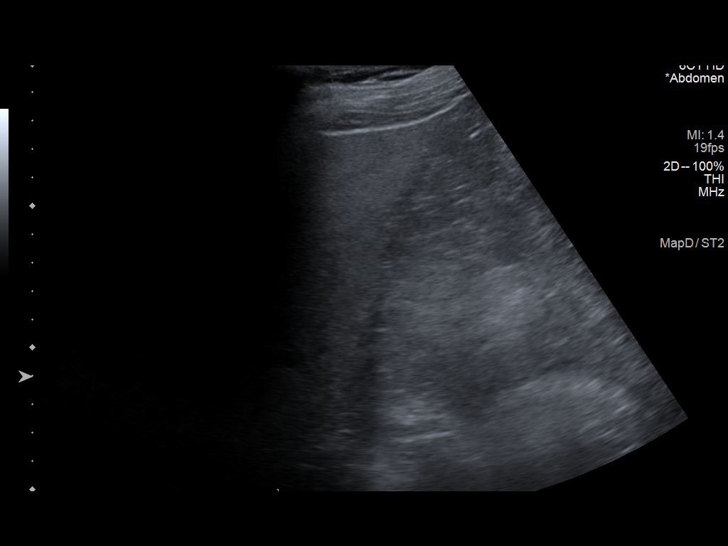
[im 12/69]
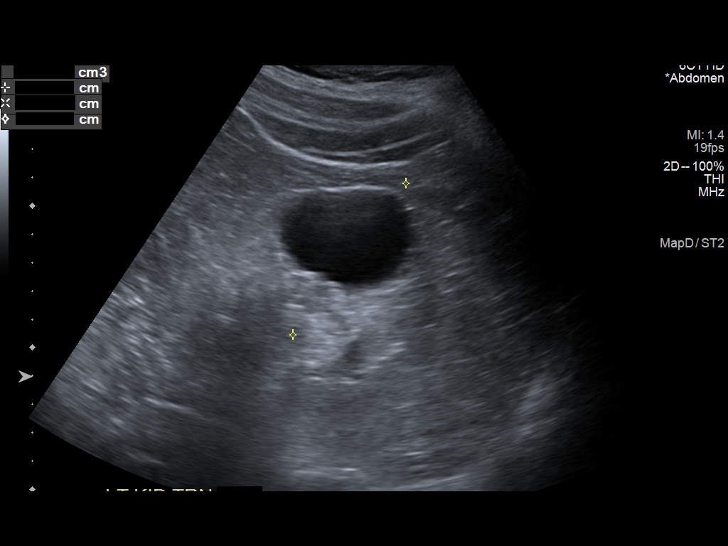
[im 18/69]
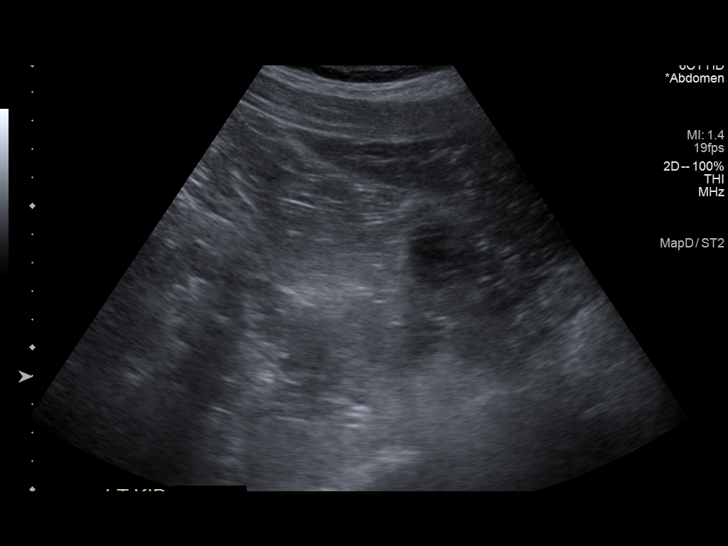
[im 23/69]
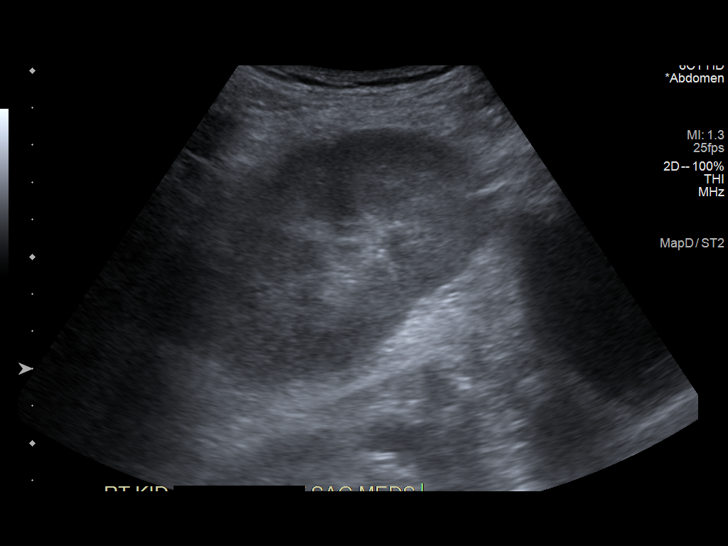
[im 29/69]
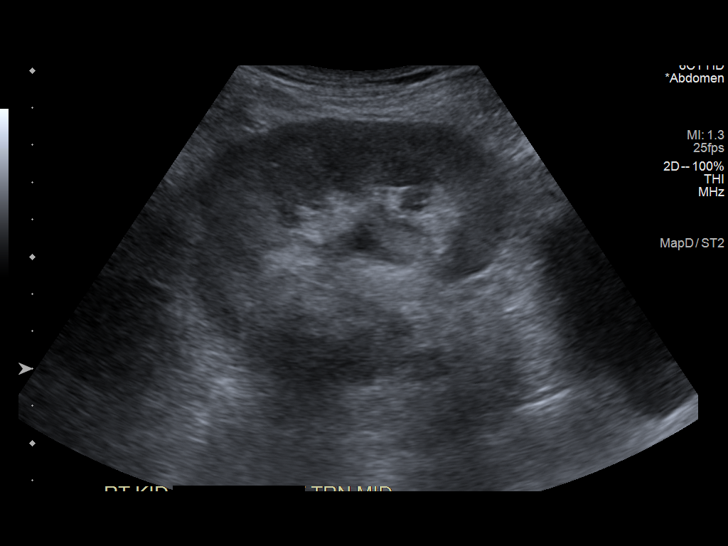
[im 35/69]
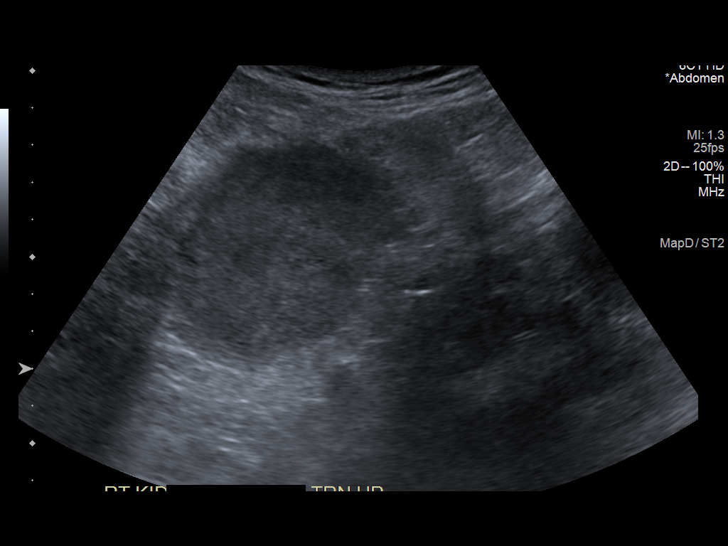
[im 40/69]
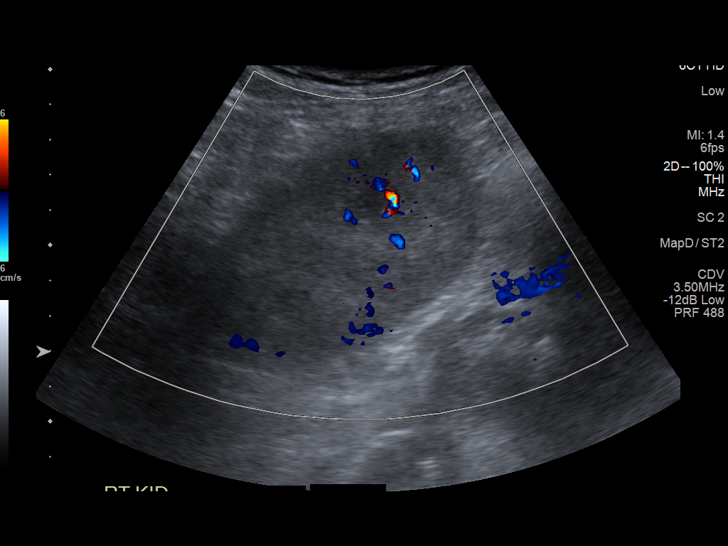
[im 46/69]
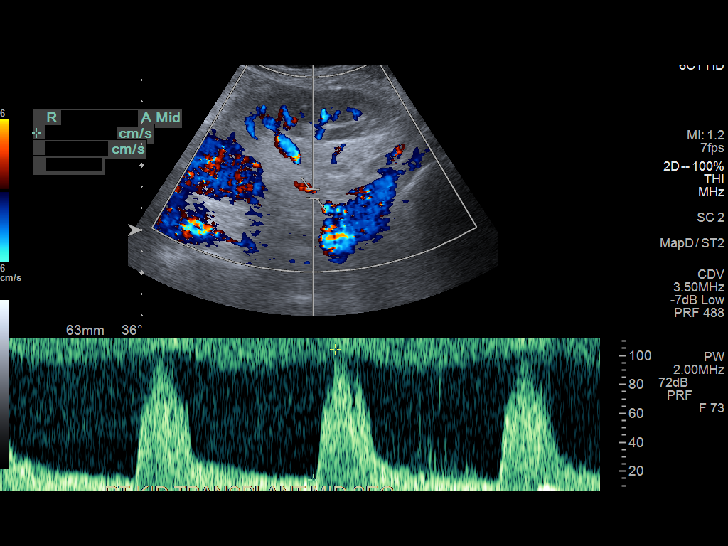
[im 52/69]
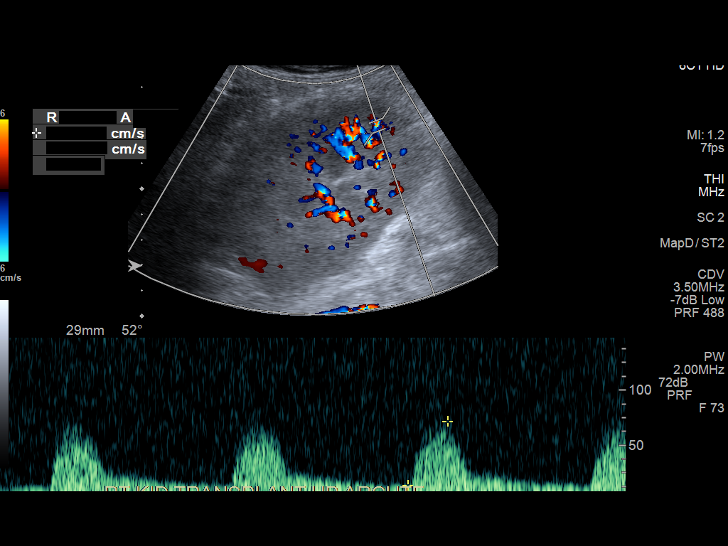
[im 57/69]
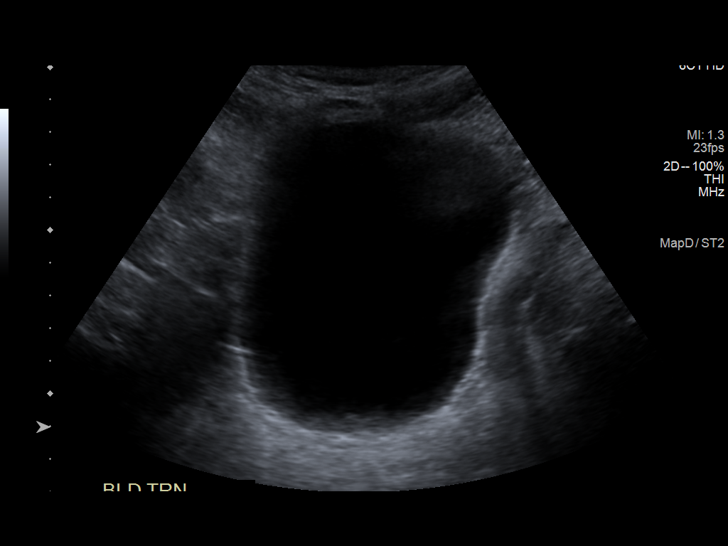
[im 63/69]
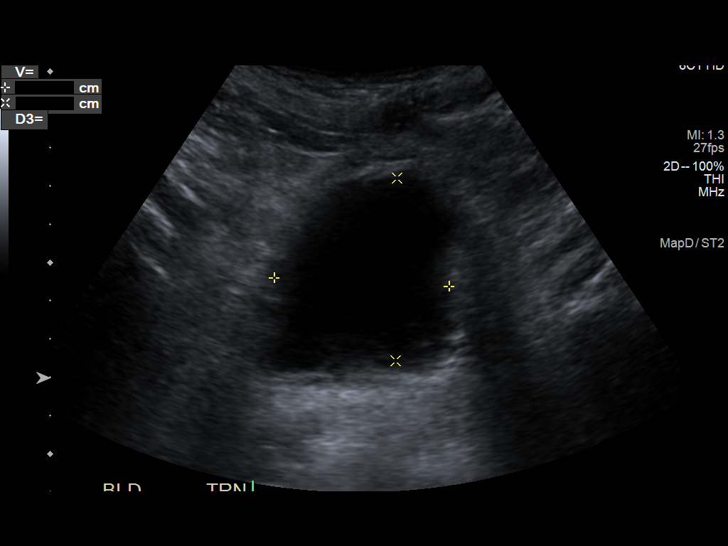
[im 69/69]
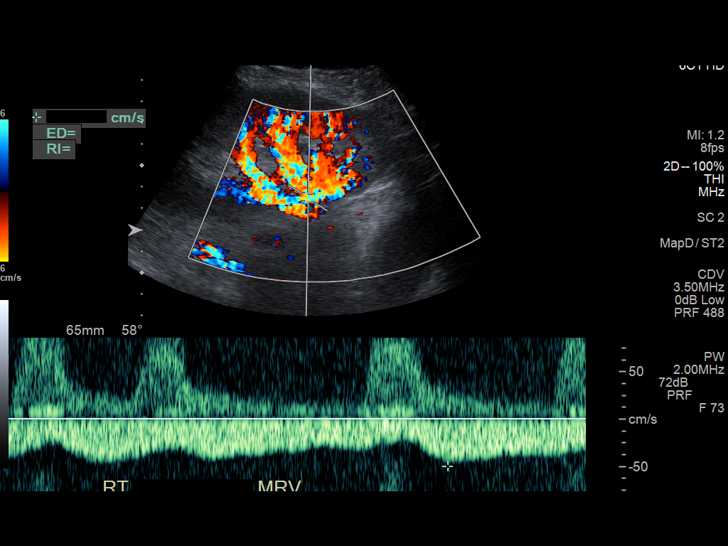

[13 of 25 positions shown; findings below may reference images not displayed]

FINDINGS: Transplant kidney location: RLQ

Transplant Kidney:

Renal measurements: 12.3 x 7.8 x 7.8 cm = volume: 387 mL. Normal
cortical thickness and echogenicity. No mass or shadowing
calcification. Mild hydronephrosis.

Color flow in the main renal artery:  Yes

Color flow in the main renal vein:  Yes

Duplex Doppler Evaluation:

Main Renal Artery Velocity: 58 cm/sec

Main Renal Artery Resistive Index:

Venous waveform in main renal vein:  present

Intrarenal resistive index in upper pole:

(normal 0.6-0.8; equivocal 0.8-0.9; abnormal >= 0.9)

Intrarenal resistive index in lower pole:

(normal 0.6-0.8; equivocal 0.8-0.9; abnormal >= 0.9)

Bladder: Normal for degree of bladder distention. Ureteral jet
visualization

Other findings: Native kidneys appear small, markedly thin, and
echogenic consistent with chronic medical renal disease/end-stage
renal disease.
IMPRESSION: Mild persistent transplant hydronephrosis RIGHT iliac fossa.

## 2022-09-15 ENCOUNTER — Ambulatory Visit: Payer: Medicare PPO | Admitting: Cardiology

## 2022-09-17 DIAGNOSIS — M79661 Pain in right lower leg: Secondary | ICD-10-CM | POA: Diagnosis not present

## 2022-09-17 DIAGNOSIS — S8011XA Contusion of right lower leg, initial encounter: Secondary | ICD-10-CM | POA: Diagnosis not present

## 2022-09-17 DIAGNOSIS — M7989 Other specified soft tissue disorders: Secondary | ICD-10-CM | POA: Diagnosis not present

## 2022-09-17 DIAGNOSIS — Z6825 Body mass index (BMI) 25.0-25.9, adult: Secondary | ICD-10-CM | POA: Diagnosis not present

## 2023-02-08 DIAGNOSIS — I10 Essential (primary) hypertension: Secondary | ICD-10-CM | POA: Diagnosis not present

## 2023-02-08 DIAGNOSIS — N02B9 Other recurrent and persistent immunoglobulin A nephropathy: Secondary | ICD-10-CM | POA: Diagnosis not present

## 2023-02-08 DIAGNOSIS — Z940 Kidney transplant status: Secondary | ICD-10-CM | POA: Diagnosis not present
